# Patient Record
Sex: Female | Born: 1990 | Race: White | Hispanic: Yes | Marital: Single | Smoking: Never smoker
Health system: Southern US, Community
[De-identification: ages and names within clinical notes are randomized; demographics above are authoritative.]

## PROBLEM LIST (undated history)

## (undated) DIAGNOSIS — F419 Anxiety disorder, unspecified: Secondary | ICD-10-CM

## (undated) DIAGNOSIS — Z8489 Family history of other specified conditions: Secondary | ICD-10-CM

## (undated) DIAGNOSIS — R519 Headache, unspecified: Secondary | ICD-10-CM

## (undated) DIAGNOSIS — J45909 Unspecified asthma, uncomplicated: Secondary | ICD-10-CM

## (undated) DIAGNOSIS — N871 Moderate cervical dysplasia: Secondary | ICD-10-CM

## (undated) DIAGNOSIS — R87610 Atypical squamous cells of undetermined significance on cytologic smear of cervix (ASC-US): Secondary | ICD-10-CM

## (undated) DIAGNOSIS — R51 Headache: Secondary | ICD-10-CM

## (undated) DIAGNOSIS — C801 Malignant (primary) neoplasm, unspecified: Secondary | ICD-10-CM

## (undated) DIAGNOSIS — K219 Gastro-esophageal reflux disease without esophagitis: Secondary | ICD-10-CM

## (undated) DIAGNOSIS — Z87442 Personal history of urinary calculi: Secondary | ICD-10-CM

## (undated) HISTORY — DX: Personal history of urinary calculi: Z87.442

## (undated) HISTORY — DX: Unspecified asthma, uncomplicated: J45.909

## (undated) HISTORY — DX: Atypical squamous cells of undetermined significance on cytologic smear of cervix (ASC-US): R87.610

## (undated) HISTORY — DX: Malignant (primary) neoplasm, unspecified: C80.1

## (undated) HISTORY — DX: Moderate cervical dysplasia: N87.1

## (undated) HISTORY — PX: NOSE SURGERY: SHX723

---

## 2010-10-30 HISTORY — PX: DILATION AND CURETTAGE OF UTERUS: SHX78

## 2016-08-22 ENCOUNTER — Ambulatory Visit: Payer: Self-pay | Admitting: Gynecology

## 2016-09-04 ENCOUNTER — Encounter: Payer: Self-pay | Admitting: Gynecology

## 2016-09-04 ENCOUNTER — Ambulatory Visit (INDEPENDENT_AMBULATORY_CARE_PROVIDER_SITE_OTHER): Payer: Self-pay | Admitting: Gynecology

## 2016-09-04 VITALS — BP 130/84 | Ht 62.0 in | Wt 139.0 lb

## 2016-09-04 DIAGNOSIS — Z01411 Encounter for gynecological examination (general) (routine) with abnormal findings: Secondary | ICD-10-CM

## 2016-09-04 DIAGNOSIS — L292 Pruritus vulvae: Secondary | ICD-10-CM

## 2016-09-04 DIAGNOSIS — R6889 Other general symptoms and signs: Secondary | ICD-10-CM

## 2016-09-04 DIAGNOSIS — Z113 Encounter for screening for infections with a predominantly sexual mode of transmission: Secondary | ICD-10-CM

## 2016-09-04 LAB — CBC WITH DIFFERENTIAL/PLATELET
BASOS ABS: 0 {cells}/uL (ref 0–200)
Basophils Relative: 0 %
EOS ABS: 84 {cells}/uL (ref 15–500)
EOS PCT: 1 %
HCT: 44.4 % (ref 35.0–45.0)
Hemoglobin: 14.8 g/dL (ref 11.7–15.5)
Lymphocytes Relative: 21 %
Lymphs Abs: 1764 cells/uL (ref 850–3900)
MCH: 28.4 pg (ref 27.0–33.0)
MCHC: 33.3 g/dL (ref 32.0–36.0)
MCV: 85.2 fL (ref 80.0–100.0)
MONOS PCT: 5 %
MPV: 10.5 fL (ref 7.5–12.5)
Monocytes Absolute: 420 cells/uL (ref 200–950)
NEUTROS PCT: 73 %
Neutro Abs: 6132 cells/uL (ref 1500–7800)
PLATELETS: 265 10*3/uL (ref 140–400)
RBC: 5.21 MIL/uL — ABNORMAL HIGH (ref 3.80–5.10)
RDW: 13.9 % (ref 11.0–15.0)
WBC: 8.4 10*3/uL (ref 3.8–10.8)

## 2016-09-04 LAB — LIPID PANEL
CHOL/HDL RATIO: 4.1 ratio (ref <5.0)
CHOLESTEROL: 284 mg/dL — AB (ref <200)
HDL: 70 mg/dL (ref >50)
LDL CALC: 188 mg/dL — AB
TRIGLYCERIDES: 128 mg/dL (ref <150)
VLDL: 26 mg/dL (ref <30)

## 2016-09-04 LAB — COMPREHENSIVE METABOLIC PANEL
ALBUMIN: 4.7 g/dL (ref 3.6–5.1)
ALT: 15 U/L (ref 6–29)
AST: 15 U/L (ref 10–30)
Alkaline Phosphatase: 73 U/L (ref 33–115)
BUN: 11 mg/dL (ref 7–25)
CHLORIDE: 102 mmol/L (ref 98–110)
CO2: 23 mmol/L (ref 20–31)
CREATININE: 0.61 mg/dL (ref 0.50–1.10)
Calcium: 9.4 mg/dL (ref 8.6–10.2)
Glucose, Bld: 83 mg/dL (ref 65–99)
POTASSIUM: 3.9 mmol/L (ref 3.5–5.3)
SODIUM: 139 mmol/L (ref 135–146)
Total Bilirubin: 0.4 mg/dL (ref 0.2–1.2)
Total Protein: 7.8 g/dL (ref 6.1–8.1)

## 2016-09-04 LAB — TSH: TSH: 1.45 mIU/L

## 2016-09-04 LAB — WET PREP FOR TRICH, YEAST, CLUE
CLUE CELLS WET PREP: NONE SEEN
Trich, Wet Prep: NONE SEEN
Yeast Wet Prep HPF POC: NONE SEEN

## 2016-09-04 MED ORDER — METRONIDAZOLE 500 MG PO TABS: 500.0000 mg | ORAL_TABLET | Freq: Two times a day (BID) | ORAL | 0 refills | Status: DC

## 2016-09-04 NOTE — Progress Notes (Signed)
Chelsea Fernandez Dec 12, 1990 [id number]   History:    25 y.o.  for annual gyn exam who is a new patient to the practice. Patient states she has never had a gynecological exam or Pap smear in the past. She has been sexually active in the past and has had a total of 5 partners she has not been sexually active now since last month. She reports normal menstrual cycles. She had used condoms in the past. She has never received the HPV vaccine series. She had complained of some vulvar pruritus and was interested in an STD screen. She declined the flu vaccine today.  Past medical history,surgical history, family history and social history were all reviewed and documented in the EPIC chart.  Gynecologic History Patient's last menstrual period was 08/27/2016. Contraception: condoms Last Pap: No previous study. Results were: No previous study Last mammogram: Not indicated. Results were: Not indicated  Obstetric History OB History  Gravida Para Term Preterm AB Living  1 0     1 0  SAB TAB Ectopic Multiple Live Births  1            # Outcome Date GA Lbr Len/2nd Weight Sex Delivery Anes PTL Lv  1 SAB                ROS: A ROS was performed and pertinent positives and negatives are included in the history.  GENERAL: No fevers or chills. HEENT: No change in vision, no earache, sore throat or sinus congestion. NECK: No pain or stiffness. CARDIOVASCULAR: No chest pain or pressure. No palpitations. PULMONARY: No shortness of breath, cough or wheeze. GASTROINTESTINAL: No abdominal pain, nausea, vomiting or diarrhea, melena or bright red blood per rectum. GENITOURINARY: No urinary frequency, urgency, hesitancy or dysuria. MUSCULOSKELETAL: No joint or muscle pain, no back pain, no recent trauma. DERMATOLOGIC: No rash, no itching, no lesions. ENDOCRINE: No polyuria, polydipsia, no heat or cold intolerance. No recent change in weight. HEMATOLOGICAL: No anemia or easy bruising or bleeding. NEUROLOGIC: No  headache, seizures, numbness, tingling or weakness. PSYCHIATRIC: No depression, no loss of interest in normal activity or change in sleep pattern.     Exam: chaperone present  BP 130/84   Ht 5\' 2"  (1.575 m)   Wt 139 lb (63 kg)   LMP 08/27/2016   BMI 25.42 kg/m   Body mass index is 25.42 kg/m.  General appearance : Well developed well nourished female. No acute distress HEENT: Eyes: no retinal hemorrhage or exudates,  Neck supple, trachea midline, no carotid bruits, no thyroidmegaly Lungs: Clear to auscultation, no rhonchi or wheezes, or rib retractions  Heart: Regular rate and rhythm, no murmurs or gallops Breast:Examined in sitting and supine position were symmetrical in appearance, no palpable masses or tenderness,  no skin retraction, no nipple inversion, no nipple discharge, no skin discoloration, no axillary or supraclavicular lymphadenopathy Abdomen: no palpable masses or tenderness, no rebound or guarding Extremities: no edema or skin discoloration or tenderness  Pelvic:  Bartholin, Urethra, Skene Glands: Within normal limits             Vagina: No gross lesions or discharge  Cervix: No gross lesions or discharge  Uterus  anteverted, normal size, shape and consistency, non-tender and mobile  Adnexa  Without masses or tenderness  Anus and perineum  normal   Rectovaginal  normal sphincter tone without palpated masses or tenderness             Hemoccult not indicated  Wet prep many white blood cells, moderate bacteria  Assessment/Plan:  25 y.o. female for annual exam with nonspecific vaginitis we'll give her prescription for Flagyl 500 mg take 1 by mouth twice a day for 7 days. GC and Chlamydia culture was obtained today. Pap smear without HPV screening was done today according to the new guidelines. To complete the has STD screen the following blood work will be ordered: HIV, RPR, hepatitis B and C. Also a CBC, fasting lipid profile, conference metabolic panel, TSH and  urinalysis. Patient declined flu vaccine. Literature information of the HPV vaccine was provided.   Terrance Mass MD, 11:23 AM 09/04/2016

## 2016-09-04 NOTE — Patient Instructions (Addendum)
Human Papillomavirus Quadrivalent Vaccine suspension for injection Qu es este medicamento? La [employer] CONTRA EL VIRUS DEL PAPILOMA HUMANO es una vacuna. Se utiliza para prevenir infecciones de cuatro tipos de virus del papiloma humano. En mujeres, la vacuna puede disminuir su riesgo de desarrollar cncer cervical, vaginal, vulvar, o anal y verrugas genitales. En hombres, la vacuna puede disminuir su riesgo de verrugas genitales y cncer anal. No puede contraer estas enfermedades de esta vacuna. Este medicamento no trata [employer]. Este medicamento puede ser utilizado para otros usos; si tiene alguna pregunta consulte con su proveedor de atencin mdica o con su farmacutico. Qu le debo informar a mi profesional de la salud antes de tomar este medicamento? Necesita saber si usted presenta alguno de los siguientes problemas o situaciones: -fiebre o infeccin -hemofilia -infeccin por VIH o SIDA -problemas del sistema inmunolgico -conteos bajos de plaquetas -una reaccin alrgica o inusual a la vacuna contra el virus del papiloma humano, a la levadura, a otros medicamentos, alimentos, colorantes o conservantes -si est embarazada o buscando quedar embarazada -si est amamantando a un beb Cmo debo utilizar este medicamento? Esta vacuna se inyecta en el msculo en la parte superior del brazo o en el muslo. La administra un profesional de [employer]	[place]. Debe ser supervisado por 15 minutos despus de recibir cada dosis. A veces, puede desmayarse despus de recibir la vacuna. Es posible que le pidan que se siente o se acueste durante los 15 minutos. Se administran tres dosis. La segunda dosis se administra 2 meses de recibir la primera dosis. La ltima dosis se administra 4 meses despus de recibir la segunda dosis. Recibir una copia de informacin escrita sobre la vacuna antes de cada vacuna. Asegrese de leer este folleto cada vez cuidadosamente. Este folleto puede cambiar con  frecuencia. Hable con su pediatra para informarse acerca del uso de este medicamento en nios. Aunque este medicamento ha sido recetado a nios tan menores como de 9 aos de edad para condiciones selectivas, las precauciones se aplican. Sobredosis: Pngase en contacto inmediatamente con un centro toxicolgico o una sala de urgencia si usted cree que haya tomado demasiado medicamento. ATENCIN: [employer] es solo para usted. No comparta este medicamento con nadie. Qu sucede si me olvido de una dosis? Todas las 3 dosis de esta vacuna deben ser administradas dentro de 6 meses. Recuerde de mantener todas las citas para las dosis siguientes. Su proveedor de Museum/gallery curator cuando necesita volver para su prxima dosis. Consulte a su profesional de la salud por asesoramiento si no puede asistir a una cita o si se olvida una dosis programada. Qu puede interactuar con este medicamento? -otras vacunas Puede ser que esta lista no menciona todas las posibles interacciones. Informe a su profesional de KB Home	[place] de [employer] productos a base de hierbas, medicamentos de [place] o suplementos nutritivos que est tomando. Si usted fuma, consume bebidas alcohlicas o si utiliza drogas ilegales, indqueselo tambin a su profesional de KB Home	[place]. Algunas sustancias pueden interactuar con su medicamento. A qu debo estar atento al usar [employer]? Es posible que esta vacuna no proteja completamente a todos. Contine a realizarse exmenes plvicos y del cncer cervical o anal de Geographical information systems officer regular como le haya indicado su mdico. El virus del papiloma humano es una enfermedad de transmisin sexual. Se puede pasar por cualquier actividad sexual que consiste de contacto genital. La vacuna acta mejor cuando se administra antes de tener contacto con el virus. [employer]  tienen el virus no muestran signos ni sntomas ningunos. Si presenta una reaccin o sntoma inusual despus de  recibir la vacuna, informe a su mdico o su profesional de [employer]	[place]. Qu efectos secundarios puedo tener al [employer] este medicamento? Efectos secundarios que debe informar a su mdico o a Barrister's clerk de la salud tan pronto como sea posible: -Chief of Staff como erupcin cutnea, picazn o urticarias, hinchazn de la cara, labios o lengua -problemas respiratorios -sensacin de desmayos o cadas Efectos secundarios que, por lo general, no requieren atencin mdica (debe informarlos a su mdico o a su profesional de la salud si persisten o si son molestos): -tos -mareos -fiebre -dolor de cabeza -nusea -enrojecimiento, calor, hinchazn, dolor o picazn en el lugar de la inyeccin Puede ser que esta lista no menciona todos los posibles efectos secundarios. Comunquese a su mdico por asesoramiento mdico [employer]. Usted puede informar los efectos secundarios a la FDA por telfono al 1-800-FDA-1088. Dnde debo guardar mi medicina? Este medicamento se administra en hospitales o clnicas y no necesitar guardarlo en su domicilio. ATENCIN: Este folleto es un resumen. Puede ser que no cubra toda la posible informacin. Si usted tiene preguntas acerca de esta medicina, consulte con su mdico, su farmacutico o su profesional de Technical sales engineer.    2016, Elsevier/Gold Standard. (2014-12-09 00:00:00)   Vaginosis bacteriana (Bacterial Vaginosis) La vaginosis bacteriana es una infeccin vaginal que perturba el equilibrio normal de las bacterias que se encuentran en la vagina. Es el resultado de un crecimiento excesivo de ciertas bacterias. Esta es la infeccin vaginal ms frecuente en mujeres en edad reproductiva. El tratamiento es importante para prevenir complicaciones, especialmente en mujeres embarazadas, dado que puede causar un parto prematuro. CAUSAS  La vaginosis bacteriana se origina por un aumento de bacterias nocivas que, generalmente, estn presentes en cantidades  ms pequeas en la vagina. Varios tipos diferentes de bacterias pueden causar esta afeccin. Sin embargo, la causa de su desarrollo no se comprende totalmente. [place] o comportamientos pueden exponerlo a un mayor riesgo de desarrollar vaginosis bacteriana, entre los que se incluyen:  Tener una nueva pareja sexual o mltiples parejas sexuales.  Las duchas vaginales  El uso del DIU (dispositivo intrauterino) como mtodo anticonceptivo. El contagio no se produce en baos, por ropas de cama, en piscinas o por contacto con objetos. SIGNOS Y SNTOMAS  Algunas mujeres que padecen vaginosis bacteriana no presentan signos ni sntomas. Los sntomas ms comunes son:  Secrecin vaginal de color grisceo.  Secrecin vaginal con olor similar al [employer], especialmente despus de Retail banker.  Picazn o sensacin de ardor en la vagina o la vulva.  Ardor o dolor al [employer]. DIAGNSTICO  Su mdico analizar su historia clnica y le examinar la vagina para detectar signos de vaginosis bacteriana. Puede tomarle Truddie Coco de flujo vaginal. Su mdico examinar esta muestra con un microscopio para controlar las bacterias y clulas anormales. Tambin puede realizarse un anlisis del pH vaginal.  TRATAMIENTO  La vaginosis bacteriana puede tratarse con antibiticos, en forma de comprimidos o de crema vaginal. Puede indicarse una segunda tanda de antibiticos si la afeccin se repite despus del tratamiento. Debido a que la vaginosis bacteriana aumenta el riesgo de contraer enfermedades de transmisin sexual, el tratamiento puede ayudar a reducir el riesgo de clamidia, [place], VIH y herpes. [place] solo medicamentos de venta libre o recetados, segn las indicaciones del mdico.  Si le han  recetado antibiticos, tmelos como se le indic. Asegrese de que finaliza la prescripcin completa aunque se sienta  mejor.  Comunique a sus compaeros sexuales que sufre una infeccin vaginal. Deben consultar a su mdico y recibir tratamiento si tienen problemas, como picazn o una erupcin cutnea leve.  Durante el [place], es importante que siga estas indicaciones:  Visual merchandiser relaciones sexuales o use preservativos de la forma correcta.  No se haga duchas vaginales.  Evite consumir alcohol como se lo haya indicado el mdico.  Community education officer se lo haya indicado el mdico. SOLICITE ATENCIN MDICA SI:   Sus sntomas no mejoran despus de 3 das de [place].  Aumenta la secrecin o Conservation officer, historic buildings.  Tiene fiebre. ASEGRESE DE QUE:   Comprende estas instrucciones.  Controlar su afeccin.  Recibir ayuda de inmediato si no mejora o si empeora. PARA OBTENER MS INFORMACIN  Centros para el control y la prevencin de Probation officer for Disease Control and Prevention, CDC): AppraiserFraud.fi Asociacin Estadounidense de la Salud Sexual (American Sexual Health Association, SHA): www.ashastd.org    Esta informacin no tiene Marine scientist el consejo del mdico. Asegrese de hacerle al mdico cualquier pregunta que tenga.   Document Released: 01/23/2008 Document Revised: 11/06/2014 Elsevier Interactive Patient Education 2016 [employer]. Metronidazole injection Sander Nephew es este medicamento? El METRONIDAZOL es un antiinfeccioso. Este medicamento se [place] para tratar o prevenir ciertos tipos de infecciones bacterianas y por protozoos. No es efectivo para resfros, gripe u otras infecciones de origen viral. Este medicamento puede ser utilizado para otros usos; si tiene alguna pregunta consulte con su proveedor de atencin mdica o con su farmacutico. Qu le debo informar a mi profesional de la salud antes de tomar este medicamento? Necesita saber si usted presenta alguno de los [employer] o situaciones: -anemia u otros trastornos sanguneos -enfermedad del sistema  nervioso -infeccin mictica o por levadura -antecedentes de edema, hinchazn -si consume bebidas alcohlicas -enfermedad heptica -convulsiones -una reaccin alrgica o inusual al metronidazol, a otros medicamentos, alimentos, colorantes o conservantes -si est embarazada o buscando quedar embarazada -si est amamantando a un beb Cmo debo utilizar este medicamento? Este medicamento se administra mediante infusin por va intravenosa. Lo administra un profesional de Technical sales engineer en un hospital o en un entorno clnico. Si recibe este medicamento en su domicilio, le ensearn cmo preparar y Biomedical engineer. selo exactamente como se le indique. Use sus dosis a intervalos regulares. No use su medicamento con una frecuencia mayor a la indicada. Es importante que deseche las agujas y las jeringas usadas en un recipiente resistente a los pinchazos. No las deseche en una basura. Si no tiene un recipiente resistente a los pinchazos, consulte a Midwife o su proveedor de atencin para obtenerlo. Hable con su pediatra para informarse acerca del uso de este medicamento en nios. Puede requerir atencin especial. Sobredosis: Pngase en contacto inmediatamente con un centro toxicolgico o una sala de urgencia si usted cree que haya tomado demasiado medicamento. ATENCIN: [employer] es solo para usted. No comparta este medicamento con nadie. Qu sucede si me olvido de una dosis? Si olvida una dosis, tmela lo antes posible. Si es casi la hora de la prxima dosis, tome slo esa dosis. No tome dosis adicionales o dobles. Qu puede interactuar con este medicamento? No tome esta medicina con ninguno de los siguientes medicamentos: -alcohol o cualquier producto que contiene alcohol -solucin oral de amprenavir -cisapride -disulfiram -dofetilida -dronedarona -inyeccin de paclitaxel -pimozida -solucin oral de ritonavir -solucin oral de  sertralina -inyeccin de  sulfametoxasol-trimetoprima -tioridazina -ziprasidona Esta medicina tambin puede interactuar con los siguientes medicamentos: -pldoras anticonceptivas -cimetidina -litio -otros medicamentos que prolongan el intervalo QT (provoca un ritmo cardiaco anormal) -fenobarbital -fenitona -warfarina Puede ser que esta lista no menciona todas las posibles interacciones. Informe a su profesional de KB Home	[place] de [employer] productos a base de hierbas, medicamentos de [place] o suplementos nutritivos que est tomando. Si usted fuma, consume bebidas alcohlicas o si utiliza drogas ilegales, indqueselo tambin a su profesional de KB Home	[place]. Algunas sustancias pueden interactuar con su medicamento. A qu debo estar atento al usar Coca[employer]Cola? Consulte a su mdico o a su profesional de la salud si sus sntomas no mejoran o si empeoran. Puede experimentar mareos o somnolencia. No conduzca ni utilice maquinaria ni haga nada que Associate Professor en estado de alerta hasta que sepa cmo le afecta este medicamento. No se siente ni se ponga de pie con rapidez, especialmente si es un paciente de edad avanzada. Esto reduce el riesgo de mareos o [employer]. Evite las bebidas alcohlicas durante el tratamiento con este medicamento y [employer] tres das siguientes. El alcohol puede causarle mareos o hacerlo sentir enfermo o ruborizado. Si est recibiendo tratamiento para una enfermedad de transmisin sexual, no tenga relaciones sexuales hasta que haya completado el [place]. Es posible que su pareja tambin necesite [place]. Qu efectos secundarios puedo tener al [employer] este medicamento? Efectos secundarios que debe informar a su mdico o a Barrister's clerk de la salud tan pronto como sea posible: -Chief of Staff como erupcin cutnea o urticarias, hinchazn de la cara, labios o lengua -confusin, torpeza -dificultad para hablar -decoloracin o dolor de la boca -mareos -fiebre,  infeccin -entumecimiento, hormigueo, dolor o debilidad -dificultad para orinar o cambios en el volumen de orina -enrojecimiento, formacin de ampollas, descamacin o distensin de la piel, inclusive dentro de la boca -convulsiones -sangrado, magulladuras inusuales -cansancio o debilidad inusual -irritacin, resequedad o flujo de la vagina Efectos secundarios que, por lo general, no requieren atencin mdica (debe informarlos a su mdico o a su profesional de la salud si persisten o si son molestos): -orina oscura -diarrea -dolor de cabeza -irritabilidad -sabor metlico -nuseas -dolor, irritacin en el lugar de la inyeccin -calambres, dolores estomacales -dificultad para conciliar el sueo Puede ser que esta lista no menciona todos los posibles efectos secundarios. Comunquese a su mdico por asesoramiento mdico [employer]. Usted puede informar los efectos secundarios a la FDA por telfono al 1-800-FDA-1088. Dnde debo guardar mi medicina? Mantngala fuera del alcance de los nios. Si est Theatre manager en su domicilio recibir instrucciones acerca de cmo debe guardar este medicamento. Deseche todo el medicamento que no haya utilizado, despus de la fecha de vencimiento. ATENCIN: Este folleto es un resumen. Puede ser que no cubra toda la posible informacin. Si usted tiene preguntas acerca de esta medicina, consulte con su mdico, su farmacutico o su profesional de Technical sales engineer.    2016, Elsevier/Gold Standard. (2014-12-08 00:00:00)

## 2016-09-05 ENCOUNTER — Encounter: Payer: Self-pay | Admitting: Gynecology

## 2016-09-05 ENCOUNTER — Telehealth: Payer: Self-pay | Admitting: *Deleted

## 2016-09-05 DIAGNOSIS — E785 Hyperlipidemia, unspecified: Secondary | ICD-10-CM | POA: Insufficient documentation

## 2016-09-05 LAB — URINALYSIS W MICROSCOPIC + REFLEX CULTURE
BACTERIA UA: NONE SEEN [HPF]
BILIRUBIN URINE: NEGATIVE
Casts: NONE SEEN [LPF]
GLUCOSE, UA: NEGATIVE
HGB URINE DIPSTICK: NEGATIVE
Ketones, ur: NEGATIVE
LEUKOCYTES UA: NEGATIVE
Nitrite: NEGATIVE
PH: 6 (ref 5.0–8.0)
PROTEIN: NEGATIVE
Specific Gravity, Urine: 1.025 (ref 1.001–1.035)
WBC UA: NONE SEEN WBC/HPF (ref <=5)
Yeast: NONE SEEN [HPF]

## 2016-09-05 LAB — GC/CHLAMYDIA PROBE AMP
CT Probe RNA: NOT DETECTED
GC PROBE AMP APTIMA: NOT DETECTED

## 2016-09-05 LAB — HEPATITIS C ANTIBODY: HCV Ab: NEGATIVE

## 2016-09-05 LAB — HEPATITIS B SURFACE ANTIGEN: HEP B S AG: NEGATIVE

## 2016-09-05 LAB — HIV ANTIBODY (ROUTINE TESTING W REFLEX): HIV 1&2 Ab, 4th Generation: NONREACTIVE

## 2016-09-05 LAB — RPR

## 2016-09-05 MED ORDER — METRONIDAZOLE 500 MG PO TABS: 500.0000 mg | ORAL_TABLET | Freq: Two times a day (BID) | ORAL | 0 refills | Status: DC

## 2016-09-05 NOTE — Telephone Encounter (Signed)
Pt was seen on 09/04/16 and Rx for flagyl was never sent to pharmacy, it was set on print, Rx sent today.

## 2016-09-06 ENCOUNTER — Other Ambulatory Visit: Payer: Self-pay | Admitting: Gynecology

## 2016-09-06 DIAGNOSIS — E7800 Pure hypercholesterolemia, unspecified: Secondary | ICD-10-CM

## 2016-09-06 LAB — URINE CULTURE

## 2016-09-08 LAB — PAP IG W/ RFLX HPV ASCU

## 2016-09-11 ENCOUNTER — Encounter: Payer: Self-pay | Admitting: Gynecology

## 2016-09-11 LAB — HUMAN PAPILLOMAVIRUS, HIGH RISK: HPV DNA High Risk: DETECTED — AB

## 2016-10-04 ENCOUNTER — Encounter: Payer: Self-pay | Admitting: Gynecology

## 2016-10-04 ENCOUNTER — Ambulatory Visit (INDEPENDENT_AMBULATORY_CARE_PROVIDER_SITE_OTHER): Payer: Self-pay | Admitting: Gynecology

## 2016-10-04 VITALS — BP 120/72

## 2016-10-04 DIAGNOSIS — R87610 Atypical squamous cells of undetermined significance on cytologic smear of cervix (ASC-US): Secondary | ICD-10-CM | POA: Insufficient documentation

## 2016-10-04 DIAGNOSIS — Z23 Encounter for immunization: Secondary | ICD-10-CM

## 2016-10-04 DIAGNOSIS — R87810 Cervical high risk human papillomavirus (HPV) DNA test positive: Secondary | ICD-10-CM

## 2016-10-04 NOTE — Patient Instructions (Signed)
Vacuna contra el VPH (papiloma humano): Lo que debe saber (HPV [Human Papillomavirus] Vaccine: What You Need to Know) 1. Por qu vacunarse? La vacuna contra el VPH evita la infeccin por algunos tipos del virus del papiloma humano (VPH) asociados a diversos tipos de Hotel manager, entre ellos:  cncer de cuello del tero en las mujeres,  cncer vaginal y vulvar en las mujeres,  cncer anal en las mujeres y los hombres,  cncer de garganta en las mujeres y los hombres,  cncer de pene en los hombres. Adems, la vacuna contra el VPH previene los tipos de VPH que causan verrugas genitales tanto en las mujeres como en los hombres. En los [place], cerca de 12000 mujeres contraen cncer de cuello del tero cada ao y alrededor de 4000 mueren a causa de l. La vacuna Consulting civil engineer VPH puede prevenir la mayora de estos casos de cncer de cuello del tero. La vacunacin no sustituye a los estudios para Public affairs consultant de cuello del tero. Esta vacuna no protege contra todos los tipos de VPH que pueden provocar cncer de cuello del tero. Las mujeres deben hacerse pruebas de Papanicolaou con regularidad.  La infeccin por el VPH en general se contrae por contacto sexual, y [place] se infectan en algn momento de su vida. Alrededor de 14 millones de estadounidenses, incluidas adolescentes, se infectan cada ao. La mayora de las infecciones desaparecern por s solas y no causarn problemas graves. Pero miles de mujeres y hombres contraen cncer y Sid Falcon enfermedades a causa del VPH. 2. Edward Jolly contra el VPH La vacuna contra el VPH est aprobada por la FDA y los CDC la recomiendan tanto para las mujeres como para los hombres. Se administra habitualmente a los 11 o 12 aos, Armed forces training and education officer se Administrator, sports desde los 9 [employer] 25 aos. La [employer] Corporation de 9 a 14aos deberan recibir la vacuna contra el VPH en dos dosis con un intervalo de 6 a 6meses. Las personas que comienzan a  vacunarse a Proofreader de los 15aos deberan recibir la vacuna en tres dosis; la segunda dosis se administra 1 o 46meses despus de la primera, y Therapist, nutritional, 73meses despus de la primera. Existen varias excepciones a estas recomendaciones de edad. Su mdico puede darle ms informacin. 3. Algunas personas no deben recibir la [employer] que hayan sufrido una reaccin alrgica grave (potencialmente mortal) a una dosis de la vacuna contra el VPH no deben recibir otra dosis.  Las personas que tengan una alergia grave (potencialmente mortal) a algn componente de la vacuna contra el VPH no deben recibir TEFL teacher.  Infrmele al mdico si sufre alguna alergia que usted conozca, como una alergia grave a Teacher, music.  La vacuna contra el VPH no se recomienda en mujeres embarazadas. Si se entera de que estaba embarazada cuando la vacunaron, no hay motivos para suponer que usted o su beb tendrn algn problema. Toda mujer que se entere de que estaba embarazada cuando recibi la vacuna contra el VPH debe comunicarse con el registro de vacunacin contra el VPH perteneciente al fabricante Alfredia Ferguson, [place] al [phone]. Las mujeres que amamantan pueden ser vacunadas.  Si tiene [employer], como un resfro, probablemente pueda recibir la vacuna. Si sufre una enfermedad moderada o grave, probablemente deba esperar hasta recuperarse para poder vacunarse. El mdico puede darle recomendaciones al respecto. 4. [place] de [place] reaccin a la vacuna Con cualquier medicamento, incluso las vacunas, existe la posibilidad  de que aparezcan efectos secundarios. Estos suelen ser leves y Armed forces operational officer por s solos, pero tambin es posible que se presenten reacciones graves. La [employer] de las personas a las que se les aplica la vacuna contra el VPH no tienen ningn problema. Algunos problemas leves o moderados despus de recibir la vacuna contra el VPH:   Reacciones en el brazo, en el sitio de la  inyeccin:  Social research officer, government (alrededor de 9 de cada 10 personas)  Enrojecimiento o hinchazn (alrededor de 1de cada 3personas)  Cristy Hilts:  Leve (100F Jareth.Covey ]) (alrededor de 1 de cada 10personas)  Moderada (102F [39C]) (alrededor de 1 de cada 65personas)  Otros problemas:  Dolor de Pensions consultant (alrededor de 1 de cada 3personas) Problemas que podran ocurrir despus de aplicarse cualquier vacuna inyectable:   Las personas a veces se desmayan despus de un procedimiento mdico, incluida la vacunacin. Permanecer sentado o recostado durante 49minutos puede ayudar a [employer] y las lesiones causadas por las cadas. Informe al mdico si se siente mareado, tiene cambios en la visin o zumbidos en los odos.  Algunas personas sienten un dolor intenso en el hombro y tienen dificultad para mover el brazo donde se coloc la vacuna. Esto sucede con muy poca frecuencia.  Cualquier medicamento puede causar una reaccin alrgica grave. Dichas reacciones son Orlene Erm poco frecuentes con una vacuna (se calcula que menos de 1en un milln de dosis) y se producen unos minutos a unas horas despus de la vacunacin. Al igual que con cualquier [employer], existe una probabilidad muy remota de que una vacuna cause una lesin grave o la [place]. Se controla permanentemente la seguridad de las vacunas. Para obtener ms informacin, visite: http://www.aguilar.org/. 5. Qu pasa si hay una reaccin grave? A qu signos debo estar atento?  Observe todo lo que le preocupe, como signos de una reaccin alrgica grave, fiebre muy alta o comportamiento fuera de lo normal. Los signos de una reaccin alrgica grave pueden incluir ronchas, hinchazn de la cara y la garganta, dificultad para respirar, latidos cardacos acelerados, mareos y debilidad. Generalmente, estos comenzaran entre unos pocos minutos y algunas horas despus de la vacunacin. Qu debo hacer?  Si usted piensa que se trata de una reaccin alrgica  grave o de otra emergencia que no puede esperar, llame al 911 o dirjase al hospital ms cercano. De lo contrario, llame al [employer]. Despus, la reaccin debe informarse al [employer] de Informacin sobre Efectos Adversos de las [place] (Vaccine Adverse Event Reporting System, VAERS). El mdico debe presentar este informe, o bien puede hacerlo usted mismo a travs del sitio web de VAERS, en www.vaers.SamedayNews.es, o llamando al 9345687903. VAERS no brinda recomendaciones mdicas.  6. [place] Compensacin de Daos por [place] de Compensacin de Daos por Clinical biochemist (National Vaccine Injury Compensation Program, VICP) es un programa federal que fue creado para Patent examiner a las personas que puedan haber sufrido daos al recibir ciertas vacunas. Aquellas personas que consideren que han sufrido un dao como consecuencia de una vacuna y [place] saber ms acerca del programa y de cmo presentar un reclamo, pueden llamar al 1-9255164664 o visitar el sitio web del VICP en GoldCloset.com.ee. Hay un lmite de tiempo para presentar un reclamo de compensacin. 7. Cmo puedo obtener ms informacin?  Pregntele a su mdico Este puede darle el prospecto de la vacuna o recomendarle otras fuentes de informacin.  Comunquese con el servicio de salud de su localidad o su estado.  Comunquese con los Centros para Building surveyor y  la Prevencin de Probation officer for Disease Control and Prevention, CDC):  Llame al [phone] (1-800-CDC-INFO) o  visite el sitio web de CDC en http://sweeney-todd.com/. Declaracin de informacin de la vacuna contra el VPH 12/11/2014 Esta informacin no tiene Marine scientist el consejo del mdico. Asegrese de hacerle al mdico cualquier pregunta que tenga. Document Released: 05/13/2014 Document Revised: 02/07/2016 Document Reviewed: 10/04/2015 Elsevier Interactive Patient Education  2017 [place] (Colposcopy, Care After) Siga estas instrucciones durante las prximas semanas. Estas indicaciones le proporcionan informacin general acerca de cmo deber cuidarse despus del procedimiento. El mdico tambin podr darle instrucciones ms especficas. El tratamiento se ha planificado de acuerdo a las prcticas mdicas actuales, pero a veces se producen problemas. Comunquese con el mdico si tiene algn problema o tiene dudas despus del procedimiento. QU ESPERAR DESPUS DEL PROCEDIMIENTO  Despus del procedimiento, es tpico tener las siguientes sensaciones:  Clicos. Generalmente se calman en algunos minutos.  Dolor. [place].  Aturdimiento. Si esto le ocurre, recustese durante algunos minutos. Podr tener un sangrado leve o una secrecin oscura que debe detenerse en [place]. Durante este tiempo deber usar un apsito sanitario. [place] vaginales y el uso de tampones durante 3 das, o segn lo que le indique su mdico.  Tome slo medicamentos de venta libre o recetados, segn las indicaciones del mdico. No tome aspirina, ya que puede causar hemorragias.  Si utiliza pldoras anticonceptivas, contine tomndolas.  No todos los resultados estarn disponibles durante su visita. En este caso, tenga otra entrevista con su mdico para conocerlos. No suponga que es normal si no tiene noticias de su mdico o del establecimiento de salud. Es Building services engineer seguimiento de todos los [place].  Siga los consejos de su mdico con respecto a los [place], [place], visitas y [place] de control. SOLICITE ATENCIN MDICA SI:  Aparece una erupcin cutnea.  Tiene problemas con los medicamentos. SOLICITE ATENCIN MDICA DE INMEDIATO SI:  Tiene una hemorragia abundante o elimina cogulos.  Tiene fiebre.  Tiene flujo vaginal anormal.  Tiene clicos que no  se alivian luego de tomar analgsicos.  Se siente mareada, tiene vahdos o se desmaya.  Siente Research scientist (life sciences). Esta informacin no tiene Marine scientist el consejo del mdico. Asegrese de hacerle al mdico cualquier pregunta que tenga. Document Released: 08/06/2013 Elsevier Interactive Patient Education  2017 [employer].

## 2016-10-04 NOTE — Progress Notes (Signed)
  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 Patient reports she had a total of 5 sexual partners in her past but currently not sexually active and had not been using any form of contraception and reports normal menstrual cycles. She had never received the HPV vaccine series and decided she wanted to have a today. She did have a full STD screening on last visit all was negative. She is here also for colposcopic evaluation.  Patient was counseled for colposcopic evaluation. A detail evaluation was done of the external genitalia, perineum, perirectal region no lesions were noted. The speculum was introduced into the vagina and a systematic inspection of the vagina, fornix and cervix was undertaken as well as applying acetic acid. A flat acetowhite area was noted at the ectocervix at the 1:00 in 12:00 areas which were respectively biopsied along with an ECC. Of note the endocervical speculum demonstrated complete visualization the transformation zone.  Assessment/plan: 25 year old with recent Pap smear ASCUS with positive HPV. Detail colposcopic evaluation with the findings noted above as well as the biopsy. We will await the results. We discussed different types of some areas depending on the pathology report and the new guidelines. Of note Monsel solution was used for hemostasis after the biopsies were obtained. She was counseled received literature information receive the first of the 3 vaccines of the HPV vaccine.

## 2016-10-04 NOTE — Addendum Note (Signed)
Addended by: Thurnell Garbe A on: 10/04/2016 11:19 AM   Modules accepted: Orders

## 2016-10-09 LAB — PATHOLOGY

## 2016-10-16 ENCOUNTER — Encounter: Payer: Self-pay | Admitting: Gynecology

## 2016-10-16 ENCOUNTER — Ambulatory Visit (INDEPENDENT_AMBULATORY_CARE_PROVIDER_SITE_OTHER): Payer: Self-pay | Admitting: Gynecology

## 2016-10-16 VITALS — BP 128/84

## 2016-10-16 DIAGNOSIS — N871 Moderate cervical dysplasia: Secondary | ICD-10-CM | POA: Insufficient documentation

## 2016-10-16 NOTE — Progress Notes (Signed)
Patient is a 25 year old that presented to the office today with her mother to discuss her colposcopic directed biopsy that was done as a result of her abnormal Pap smear. Patient was seen in the office on December 6 of this year. 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 Patient reports she had a total of 5 sexual partners in her past but currently not sexually active and had not been using any form of contraception and reports normal menstrual cycles. She also states that she has had a miscarriage in the past. She received the first of the 3 series HPV vaccine early this month. She recently had a full STD screening which was negative.  On 10/04/2016 she underwent a detail colposcopic evaluation of the external genitalia, perineum, perirectal region no lesions were noted. The speculum was introduced into the vagina and a systematic inspection of the vagina, fornix and cervix was undertaken as well as applying acetic acid. A flat acetowhite area was noted at the ectocervix at the 1:00 in 12:00 areas which were respectively biopsied along with an ECC. Of note the endocervical speculum demonstrated complete visualization the transformation zone. The pathology report demonstrated the following:  FINAL DIAGNOSIS:  A. Endocervix - Curettage:     Fragments of benign endocervical mucosa.     B. 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     Squamous dysplasia extends into endocervical mucosa.     See comment.     C. 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     The above findings were discussed with the patient and her mother and the new guidelines and  recommendations as follows:  Cyst patient is planning on having children in the future we had discussed close observation as an option versus treatment. We discussed a recent study which had demonstrated that young women with CIN-2 lesions regress and 70% a female by contrast regression rates were lower and older women between 30 and 50% after 2 years of follow-up. We also discussed at the rate increases with age, reaching 10% per year for women at age 21 or older. We also discussed that either treatment or observations except tubal if colposcopy is adequate as it was in her case. Observation is preferred. The recommendation is cytology and colposcopy every 6 months for 12 months and of the cytology and colposcopy are negative for 2 visits, HPV and cytology code testing shouldn't be performed once yearly. If the cone testing is negative, co-testing should be performed every 3 years. Colposcopy should be performed if the two-year or five-year co-testing is abnormal. We also discussed that if with close surveillance colposcopic appearance of the lesion worsens or is a high grade squamous intraepithelial lesion or a high-grade lesion persists for 1 year repeat biopsy is recommended. If CIN-2 or 3 persists for 24 months treatment would be the preferred approach. Alternatively would be treatment with either excision or ablation of the cervical transformation zone.  Patient mother decided to proceed with a close observation approach. All the above information was provided for the patient to repeat. She will return in February and in June for her second and third HPV vaccine to complete the series. And she will return back  in June for colposcopy and Pap smear.

## 2016-10-26 ENCOUNTER — Ambulatory Visit: Payer: Self-pay | Admitting: Gynecology

## 2016-11-20 ENCOUNTER — Encounter: Payer: Self-pay | Admitting: Gynecology

## 2016-11-20 ENCOUNTER — Ambulatory Visit (INDEPENDENT_AMBULATORY_CARE_PROVIDER_SITE_OTHER): Payer: Self-pay | Admitting: Gynecology

## 2016-11-20 VITALS — BP 122/80 | Ht 62.0 in | Wt 139.0 lb

## 2016-11-20 DIAGNOSIS — Z113 Encounter for screening for infections with a predominantly sexual mode of transmission: Secondary | ICD-10-CM

## 2016-11-20 DIAGNOSIS — N949 Unspecified condition associated with female genital organs and menstrual cycle: Secondary | ICD-10-CM

## 2016-11-20 LAB — WET PREP FOR TRICH, YEAST, CLUE
Clue Cells Wet Prep HPF POC: NONE SEEN
Trich, Wet Prep: NONE SEEN
WBC, Wet Prep HPF POC: NONE SEEN
Yeast Wet Prep HPF POC: NONE SEEN

## 2016-11-20 MED ORDER — METRONIDAZOLE 500 MG PO TABS: 500.0000 mg | ORAL_TABLET | Freq: Two times a day (BID) | ORAL | 0 refills | Status: DC

## 2016-11-20 NOTE — Progress Notes (Signed)
   Patient is a 26 year old a came to the office with her mother with concerns of possibly having herpes because she feels like a burning sensation in her vagina. She stated she went a week ago to the urgent care and they gave her cream for suspected yeast infection which she took for a week intravaginally. She is not sexually active. She has no GU or GI complaints. She was seen in the office on December 18 whereby she had colposcopic directed biopsy as a result of her Pap smear having demonstrated ASCUS with high-risk HPV. 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 She also received in December her first of 3 HPV vaccine. Patient currently menstruating. Patient in November and December this past year she had a full STD screening consisting of HIV, RPR, hepatitis B and C as well as GC and Chlamydia culture all were negative.  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 We will obtain an HSV panel today. The reporting the results of the HSV was explained to the mother and to the daughter. In the event that's there is some underlying BV we'll go ahead and give her Flagyl 500 mg twice a day for 7 days. Will notify does any abnormality in the above-mentioned test. She was reminded to return back next months for her second HPV vaccine and then in June at which time she will need her colposcopy and Pap smear

## 2016-11-20 NOTE — Addendum Note (Signed)
Addended by: Thurnell Garbe A on: 11/20/2016 11:20 AM   Modules accepted: Orders

## 2016-11-20 NOTE — Addendum Note (Signed)
Addended by: Thurnell Garbe A on: 11/20/2016 11:21 AM   Modules accepted: Orders

## 2016-11-21 LAB — HSV(HERPES SIMPLEX VRS) I + II AB-IGG: HSV 2 Glycoprotein G Ab, IgG: <0.9 Index (ref <0.90)

## 2016-12-04 ENCOUNTER — Ambulatory Visit (INDEPENDENT_AMBULATORY_CARE_PROVIDER_SITE_OTHER): Payer: Self-pay | Admitting: Gynecology

## 2016-12-04 ENCOUNTER — Encounter: Payer: Self-pay | Admitting: Gynecology

## 2016-12-04 VITALS — BP 118/76

## 2016-12-04 DIAGNOSIS — Z23 Encounter for immunization: Secondary | ICD-10-CM

## 2016-12-04 DIAGNOSIS — N949 Unspecified condition associated with female genital organs and menstrual cycle: Secondary | ICD-10-CM

## 2016-12-04 LAB — WET PREP FOR TRICH, YEAST, CLUE
Clue Cells Wet Prep HPF POC: NONE SEEN
Trich, Wet Prep: NONE SEEN
Yeast Wet Prep HPF POC: NONE SEEN

## 2016-12-04 MED ORDER — METRONIDAZOLE 0.75 % VA GEL: 1.0000 | Freq: Two times a day (BID) | VAGINAL | 0 refills | Status: DC

## 2016-12-04 NOTE — Addendum Note (Signed)
Addended by: Alen Blew on: 12/04/2016 12:14 PM   Modules accepted: Orders

## 2016-12-04 NOTE — Progress Notes (Signed)
   Patient is a 26 year old that presented to the office today with her mother complaining of vaginal burning sensation at times. She denies any discharge but no true pruritus. Patient was seen in the office less than 2 weeks ago and was placed on Flagyl 500 mg twice a day for 7 days.  Her history is as follows: She is not sexually active. She has no GU or GI complaints. She was seen in the office on December 18 whereby she had colposcopic directed biopsy as a result of her Pap smear having demonstrated ASCUS with high-risk HPV. 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 She also received in December her first of 3 HPV vaccine. Patient currently menstruating. Patient in November and December this past year she had a full STD screening consisting of HIV, RPR, hepatitis B and C as well as GC and Chlamydia culture all were negative.  Exam: Gen. appearance well-developed well-nourished female in no acute distress External genitalia, Bartholin urethra Skene was within normal limits Vagina: No lesions or discharge Cervix: No gross lesions on inspection  Wet prep moderate white blood cell, moderate bacteria  Assessment/plan: Nonspecific vaginitis bacterial overgrowth? We'll give her a trial of MetroGel cream to apply twice a day for 7 days. She received the second of 3 doses of the HPV vaccine series. She's otherwise scheduled to return back in June for her final dose as well as for her Pap smear colposcopic evaluation as indicated above.

## 2016-12-06 ENCOUNTER — Ambulatory Visit: Payer: Self-pay

## 2017-02-27 ENCOUNTER — Encounter: Payer: Self-pay | Admitting: Gynecology

## 2017-02-27 ENCOUNTER — Ambulatory Visit (INDEPENDENT_AMBULATORY_CARE_PROVIDER_SITE_OTHER): Payer: Self-pay | Admitting: Gynecology

## 2017-02-27 ENCOUNTER — Other Ambulatory Visit: Payer: Self-pay | Admitting: Gynecology

## 2017-02-27 VITALS — BP 104/76 | Ht 62.0 in | Wt 137.2 lb

## 2017-02-27 DIAGNOSIS — N898 Other specified noninflammatory disorders of vagina: Secondary | ICD-10-CM

## 2017-02-27 DIAGNOSIS — L290 Pruritus ani: Secondary | ICD-10-CM

## 2017-02-27 LAB — WET PREP FOR TRICH, YEAST, CLUE
CLUE CELLS WET PREP: NONE SEEN
TRICH WET PREP: NONE SEEN
Yeast Wet Prep HPF POC: NONE SEEN

## 2017-02-27 MED ORDER — METRONIDAZOLE 0.75 % VA GEL: VAGINAL | 5 refills | Status: DC

## 2017-02-27 NOTE — Progress Notes (Signed)
  Patient is a 26 year old who had a colposcopic directed  biopsy as a result of her Pap smear having demonstrated ASCUS with high-risk HPV. 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 She also received in December her first of 3 HPV vaccine. Patient currently menstruating. Patient in November and December this past year she had a full STD screening consisting of HIV, RPR, hepatitis B and C as well as GC and Chlamydia culture all were negative. Since that time she has come to the office in numerous time with concerns of vaginal discharge has been treated for bacterial vaginosis and she returned back again today because she is concerned that she has some. We irritation with questionable vaginal discharge. She had blood tests for HSV also which had been negative several months ago. Patient not sexually active. She presented to the office with her mother once again.  Exam: Pelvic: Bartholin urethra Skene was within normal limits Vagina: No gross lesions on inspection Cervix: No lesions or discharge Bimanual exam not done Perirectal area paper cut like areas were noted HSV culture was obtained  Assessment/plan: Patient appears to be traumatized when she had found out that she had abnormal Pap smear with HPV and then CIN-2 and has come to the office in numerous occasions teary-eyed thinking that this was going to affect her future childbearing and appears depressed and for this reason I'm going to refer to the therapist Almyra Free wet to assess with possible psychological issues. We did do a wet prep today moderate bacteria too numerous to  count white blood cell but no clue cells. I'm going to place her on MetroGel cream to apply intravaginally twice a week for one month to 2 months. If symptoms persist and after she is seeing the therapist we may need  to refer her to [facility] specialist perhaps at [facility]. We did discuss possibly her changing her detergent versus-type of underwear and may be a tampon during her menses instead of sanitary napkins to prevent vulvar irritation.

## 2017-02-28 ENCOUNTER — Telehealth: Payer: Self-pay

## 2017-02-28 MED ORDER — METRONIDAZOLE 0.75 % VA GEL: VAGINAL | 5 refills | Status: DC

## 2017-02-28 NOTE — Telephone Encounter (Signed)
Pharmacy sent note regarding Metrogel. Rx was sent with directions "insert applicatorful vaginally twice daily for 2-3 mos.". They were asking for clarification. On review of the office note Dr. Moshe Salisbury said "twice weekly for 2-3 mos." Pharmacy informed.

## 2017-02-28 NOTE — Telephone Encounter (Signed)
I called pharmacy and left message that Rx should read apply twice weekly for 2-3 mos.  I resent Rx with those instructions.

## 2017-03-02 ENCOUNTER — Telehealth: Payer: Self-pay | Admitting: *Deleted

## 2017-03-02 NOTE — Telephone Encounter (Signed)
I called office of Kathyrn Lass and was informed that they prefer the patient to call and schedule. I called pt and relay this information to her gave her the # [phone] to call and schedule.

## 2017-03-02 NOTE — Telephone Encounter (Signed)
-----   Message from Terrance Mass, MD sent at 02/27/2017 10:22 AM EDT ----- Anderson Malta, please make an appointment for this patient with Melinda Crutch therapist she can see my notes in epic

## 2017-03-03 LAB — SURESWAB HSV, TYPE 1/2 DNA, PCR
HSV 1 DNA: NOT DETECTED
HSV 2 DNA: NOT DETECTED

## 2017-03-14 ENCOUNTER — Encounter: Payer: Self-pay | Admitting: Gynecology

## 2017-04-06 ENCOUNTER — Ambulatory Visit (INDEPENDENT_AMBULATORY_CARE_PROVIDER_SITE_OTHER): Payer: Self-pay | Admitting: Gynecology

## 2017-04-06 ENCOUNTER — Encounter: Payer: Self-pay | Admitting: Gynecology

## 2017-04-06 VITALS — BP 110/78

## 2017-04-06 DIAGNOSIS — N871 Moderate cervical dysplasia: Secondary | ICD-10-CM

## 2017-04-06 DIAGNOSIS — N870 Mild cervical dysplasia: Secondary | ICD-10-CM

## 2017-04-06 DIAGNOSIS — K5900 Constipation, unspecified: Secondary | ICD-10-CM

## 2017-04-06 DIAGNOSIS — Z124 Encounter for screening for malignant neoplasm of cervix: Secondary | ICD-10-CM

## 2017-04-06 NOTE — Patient Instructions (Addendum)
Polyethylene Glycol powder Qu es este medicamento? El polvo de POLIETILENGLICOL 1478 en un laxante que se [place] para tratar el estreimiento. Este medicamento aumenta la cantidad de [employer]. Esto hace que las evacuaciones intestinales se produzcan con mayor facilidad y frecuencia. Este medicamento puede ser utilizado para otros usos; si tiene alguna pregunta consulte con su proveedor de atencin mdica o con su farmacutico. MARCAS COMUNES: GaviLax, GIALAX, GlycoLax, MiraLax, Smooth LAX, [facility] [employer] debo informar a mi profesional de la salud antes de tomar este medicamento? Necesita saber si usted presenta alguno de los siguientes problemas o situaciones: -antecedentes de bloqueo estomacal o intestinal -distensin o dolor abdominal actual -dificultad para tragar -diverticulitis, colitis ulcerosa u otra enfermedad intestinal crnica -fenilcetonuria -una reaccin alrgica o inusual al polietilenglicol, a otros medicamentos, colorantes o conservantes -si est embarazada o buscando quedar embarazada -si est amamantando a un beb Cmo debo utilizar este medicamento? Tome este medicamento por va oral. El frasco tiene [place] tapa de medicin marcada con una lnea. Vierta el polvo en la tapa hasta alcanzar la lnea marcada (la dosis equivale aproximadamente a 1 cucharada sopera). Agregue el polvo de la tapa a un vaso lleno (4-8 onzas o 120-240 ml) de agua, jugo, soda, caf o t. Mezcle bien el polvo. Beba la solucin. Tmela exactamente como se indica. No tome su medicamento con una frecuencia mayor que la indicada. Hable con su pediatra para informarse acerca del uso de este medicamento en nios. Puede requerir atencin especial. Sobredosis: Pngase en contacto inmediatamente con un centro toxicolgico o una sala de urgencia si usted cree que haya tomado demasiado medicamento. ATENCIN: [employer] es solo para usted. No comparta este medicamento con nadie. Qu sucede si me  olvido de una dosis? Si olvida una dosis, tmela lo antes posible. Si es casi la hora de la prxima dosis, tome slo esa dosis. No tome dosis adicionales o dobles. Qu puede interactuar con este medicamento? No se esperan interacciones. Puede ser que esta lista no menciona todas las posibles interacciones. Informe a su profesional de [employer]	[place] de [employer] productos a base de hierbas, medicamentos de [place] o suplementos nutritivos que est tomando. Si usted fuma, consume bebidas alcohlicas o si utiliza drogas ilegales, indqueselo tambin a su profesional de [employer]	[place]. Algunas sustancias pueden interactuar con su medicamento. A qu debo estar atento al usar [employer]? No lo utilice durante ms de 2 semanas sin consultar a su mdico o a su profesional de [employer]	[place]. Puede ser necesario que transcurran de 2 a 4 das hasta que se produzca una evacuacin intestinal y se observe una mejora en el estreimiento. Consulte a su profesional de la salud si observa cambios en sus hbitos intestinales, incluyendo el estreimiento, que sean severos o que duren ms de 3 semanas. Tome siempre este medicamento con agua en abundancia. Qu efectos secundarios puedo tener al [employer] este medicamento? Efectos secundarios que debe informar a su mdico o a Barrister's clerk de la salud tan pronto como sea posible: -diarrea -dificultad al respirar -picazn de la piel, urticarias o erupcin cutnea -hinchazn, dolor o distensin de estmago severo -vmito Efectos secundarios que, por lo general, no requieren atencin mdica (debe informarlos a su mdico o a su profesional de la salud si persisten o si son molestos): -sensacin de llenura o gases -molestias o calambres en la parte baja del abdomen -nuseas Puede ser que esta lista no menciona todos los posibles efectos secundarios. Comunquese a su mdico por asesoramiento  mdico [employer]. Usted puede informar los efectos secundarios a la  FDA por telfono al 1-800-FDA-1088. Dnde debo guardar mi medicina? Mantngala fuera del alcance de los nios. Gurdela a una temperatura de entre 15 y 93 grados C (15 y 80 grados F). Deseche todo el medicamento que no haya utilizado, despus de la fecha de vencimiento. ATENCIN: Este folleto es un resumen. Puede ser que no cubra toda la posible informacin. Si usted tiene preguntas acerca de esta medicina, consulte con su mdico, su farmacutico o su profesional de Technical sales engineer.  2018 Elsevier/Gold Standard (2014-12-08 00:00:00) Constipacin en los adultos (Constipation, Adult) Constipacin significa que una persona defeca en una semana menos que lo normal, hay dificultad para defecar, o las heces son secas, duras, o ms grandes que lo normal. La causa puede ser una afeccin subyacente. Puede empeorar con la edad si una persona toma ciertos medicamentos y no toma suficiente lquido. INSTRUCCIONES PARA EL CUIDADO EN EL HOGAR Comida y bebida  Consuma alimentos con alto contenido de [place], como frutas y verduras frescas, cereales integrales y frijoles.  Limite los alimentos ricos en grasas y con bajo contenido de fibra, o muy procesados, como las papas fritas, [place], [place], dulces y refrescos.  Beba suficiente lquido para Consulting civil engineer orina clara o de color amarillo plido. Instrucciones generales  Haga actividad fsica habitualmente o como se lo haya indicado el mdico.  Vaya al bao cuando sienta la necesidad de ir. No se aguante las ganas.  Tome los medicamentos de venta libre y los recetados solamente como se lo haya indicado el mdico. Estos incluyen los suplementos de [place].  Practique ejercicios de rehabilitacin del suelo plvico, como la respiracin profunda mientras relaja la parte inferior del abdomen y relajacin del suelo plvico mientras defeca.  Controle su afeccin para ver si hay cambios.  Concurra a todas las visitas de control como se lo haya indicado el mdico.  Esto es importante. SOLICITE ATENCIN MDICA SI:  Siente un dolor que empeora.  Tiene fiebre.  No defeca despus de 4das.  Vomita.  No tiene hambre.  Pierde peso.  Tiene una hemorragia en el ano.  Las heces son delgadas como un lpiz.  SOLICITE ATENCIN MDICA DE INMEDIATO SI:  Tiene fiebre y los sntomas empeoran repentinamente.  Observa que se filtran heces o hay sangre en las heces.  Tiene el abdomen hinchado.  Siente un dolor intenso en el abdomen.  Se siente mareado o se desmaya.  Esta informacin no tiene Marine scientist el consejo del mdico. Asegrese de hacerle al mdico cualquier pregunta que tenga. Document Released: 11/05/2007 Document Revised: 11/06/2014 Document Reviewed: 04/05/2016 Elsevier Interactive Patient Education  2017 [place] (Colposcopy, Care After) Siga estas instrucciones durante las prximas semanas. Estas indicaciones le proporcionan informacin general acerca de cmo deber cuidarse despus del procedimiento. El mdico tambin podr darle instrucciones ms especficas. El tratamiento se ha planificado de acuerdo a las prcticas mdicas actuales, pero a veces se producen problemas. Comunquese con el mdico si tiene algn problema o tiene dudas despus del procedimiento. QU ESPERAR DESPUS DEL PROCEDIMIENTO  Despus del procedimiento, es tpico tener las siguientes sensaciones:  Clicos. Generalmente se calman en algunos minutos.  Dolor. [place].  Aturdimiento. Si esto le ocurre, recustese durante algunos minutos. Podr tener un sangrado leve o una secrecin oscura que debe detenerse en [place]. Durante este tiempo deber usar un apsito sanitario. [place] vaginales y el uso de tampones durante 3 das, o segn lo que le indique su mdico.  Tome slo medicamentos de venta libre o recetados,  segn las indicaciones del mdico. No tome aspirina, ya que puede causar hemorragias.  Si utiliza pldoras anticonceptivas, contine tomndolas.  No todos los resultados estarn disponibles durante su visita. En este caso, tenga otra entrevista con su mdico para conocerlos. No suponga que es normal si no tiene noticias de su mdico o del establecimiento de salud. Es Building services engineer seguimiento de todos los [place].  Siga los consejos de su mdico con respecto a los [place], [place], visitas y [place] de control. SOLICITE ATENCIN MDICA SI:  Aparece una erupcin cutnea.  Tiene problemas con los medicamentos. SOLICITE ATENCIN MDICA DE INMEDIATO SI:  Tiene una hemorragia abundante o elimina cogulos.  Tiene fiebre.  Tiene flujo vaginal anormal.  Tiene clicos que no se alivian luego de tomar analgsicos.  Se siente mareada, tiene vahdos o se desmaya.  Siente Research scientist (life sciences). Esta informacin no tiene Marine scientist el consejo del mdico. Asegrese de hacerle al mdico cualquier pregunta que tenga. Document Released: 08/06/2013 Elsevier Interactive Patient Education  2017 [employer].

## 2017-04-06 NOTE — Addendum Note (Signed)
Addended by: Burnett Kanaris on: 04/06/2017 10:18 AM   Modules accepted: Orders

## 2017-04-06 NOTE — Progress Notes (Signed)
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 Patient reports she had a total of 5 sexual partners in her past but currently not sexually active and had not been using any form of contraception and reports normal menstrual cycles. She also states that she has had a miscarriage in the past. She has received 2 out of the 3 Gardasil Vaccine series and is due for her third today. Her last menstrual period was approximately 3 weeks ago. Within the past year she had a full STD screening which was negative.  On 10/04/2016 she underwent a detail colposcopic evaluation of the external genitalia, perineum, perirectal region no lesions were noted. The speculum was introduced into the vagina and a systematic inspection of the vagina, fornix and cervix was undertaken as well as applying acetic acid. A flat acetowhite area was noted at the ectocervix at the 1:00 in 12:00 areas which were respectively biopsied along with an ECC. Of note the endocervical speculum demonstrated complete visualization the transformation zone. The pathology report demonstrated the following:  FINAL DIAGNOSIS:  A. Endocervix - Curettage:     Fragments of benign endocervical mucosa.     B. 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     Squamous dysplasia extends into endocervical mucosa.     See comment.     C. 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    Since patient would like to have children in the future we discussed observation versus treatment. We discussed that a  recent study which had demonstrated that young women with CIN-2 lesions regress and 27% a female by contrast regression rates were lower and older women between 30 and 50% after 2 years of follow-up. We also discussed at the rate increases with age, reaching 10% per year for women at age 27 or older. We also discussed that either treatment or observation if  the lesion on colposcopy was adequately visualized as in her case.The recommendation is cytology and colposcopy every 6 months for 12 months with co testing for HPV. Then Pap smear with HPV testing every year for 3 years. Colposcopy should be performed if at the 2 or 5 year [place] the Pap smear is abnormal. If CIN-2 or 3 persist for 12-24 months and treatment would be indicated.  Patient underwent colposcopic evaluation today. The external genitalia, perineum and perirectal region were inspected no abnormality was noted. The speculum was introduced into the vagina. A systematic inspection of the vagina and cervix was undertaken after applying acetic acid. Once again the 2 previous areas noted on prior colposcopic evaluation and biopsy were noted and were rebiopsied again. As well as an ECC obtained.  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 Lesion still present rebiopsied today along with ECC. Patient to receive the third dose of the HPV vaccine today. If CIN-2 still persistent we have discussed possibly treating her as an outpatient with CO2 laser which she has agreed. We'll wait for the results and manage accordingly. As to her  constipation I've recommended she use MiraLAX 1 tablespoon daily and to increase her fluid intake along with increasing her fiber intake.

## 2017-04-11 LAB — PAP, TP IMAGING W/ HPV RNA, RFLX HPV TYPE 16,18/45: HPV MRNA, HIGH RISK: DETECTED — AB

## 2017-04-12 LAB — HPV TYPE 16 AND 18/45 RNA
HPV TYPE 16 RNA: DETECTED — AB
HPV TYPE 18/45 RNA: NOT DETECTED

## 2017-04-13 ENCOUNTER — Telehealth: Payer: Self-pay

## 2017-04-13 NOTE — Telephone Encounter (Signed)
I called patient and advised her after reviewing pap smear and pathology closer Dr. Moshe Salisbury recommends laser of cervix as discussed at visit.  Patient wants to come in and talk with Dr. Moshe Salisbury prior to scheduling. Appt scheduled for 04/24/17 at 11:00am.

## 2017-04-24 ENCOUNTER — Encounter: Payer: Self-pay | Admitting: Gynecology

## 2017-04-24 ENCOUNTER — Ambulatory Visit (INDEPENDENT_AMBULATORY_CARE_PROVIDER_SITE_OTHER): Payer: Self-pay | Admitting: Gynecology

## 2017-04-24 VITALS — BP 118/80 | Ht 62.0 in | Wt 141.0 lb

## 2017-04-24 DIAGNOSIS — Z87410 Personal history of cervical dysplasia: Secondary | ICD-10-CM

## 2017-04-24 NOTE — Progress Notes (Signed)
Patient is a 26 year old that presented to the office to discuss her recent colposcopic directed biopsy and Pap smear. Her history is as follows:  At the time of patient's annual exam on 09/04/2016 her Pap smear demonstrated atypical squamous cells of undetermined significance with high-risk HPV. Patient reports she had a total of 5 sexual partners in her past but currently not sexually active and had not been using any form of contraception and reports normal menstrual cycles.She also states that she has had a miscarriage in the past. She has completed her Gardasil Vaccine series recently. Within the last 12 months she has had a negative STD screen.  On 10/04/2016 she underwent a detail colposcopic evaluation of the external genitalia, perineum, perirectal region no lesions were noted. The speculum was introduced into the vagina and a systematic inspection of the vagina, fornix and cervix was undertaken as well as applying acetic acid. A flat acetowhite area was noted at the ectocervix at the 1:00 in 12:00 areas which were respectively biopsied along with an ECC. Of note the endocervical speculum demonstrated complete visualization the transformation zone.The pathology report demonstrated the following:  FINAL DIAGNOSIS:  A. Endocervix - Curettage:     Fragments of benign endocervical mucosa.     B. 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     Squamous dysplasia extends into endocervical mucosa.     See comment.     C. 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    June 18 of this year she had a Pap smear which demonstrated the following: Low-grade squamous intraepithelial lesion with positive HPV 16 detected  An at same office visit she underwent a detail  colposcopic evaluation the following was noted: Physical Exam  Genitourinary:     The above areas with the same ones that were previously biopsied early in the year.  The biopsy demonstrated the following: Diagnosis 1. Cervix, biopsy, 1:00 o'clock -CERVICAL TRANSFORMATION ZONE MUCOSA WITH MODERATE INFLAMMATION AND REACTIVE EPITHELIAL CHANGES. -NO DYSPLASIA OR MALIGNANCY IDENTIFIED. -SEE COMMENT. 2. Cervix, biopsy, 6:00 o'clock -CERVICAL TRANSFORMATION ZONE MUCOSA WITH MILD INFLAMMATION AND REACTIVE EPITHELIAL CHANGES. -NO DYSPLASIA OR MALIGNANCY IDENTIFIED. -SEE COMMENT. 3. Endocervix, curettage -INFLAMED, FRAGMENTED BENIGN ENDOCERVICAL MUCOSA IN A BACKGROUND OF BLOOD AND MUCOID MATERIAL. -NO DYSPLASIA OR MALIGNANCY IDENTIFIED. -SEE COMMENT. Microscopic Comment 1. - 3. The diagnoses are supported with p16 immunohistochemistry.  Her mother was present during the consultation today and we went over the following: Since patient would like to have children in the future we discussed observation versus treatment. We discussed that a recent study which had demonstrated that young women with CIN-2 lesions regress in 70% of  Females  by contrast regression rates were lower in older women between 30 and 50% after 2 years of follow-up. We also discussed at the rate increases with age, reaching 10% per year for women at age 36 or older. We also discussed that either treatment or observation if  the lesion on colposcopy was adequately visualized as in her case.The recommendation is cytology and colposcopy every 6 months for 12 months with co testing for HPV. Then Pap smear with HPV testing every year for 3 years. Colposcopy should be performed if at the 2 or 5 year if the Pap smear is abnormal. If CIN-2 or 3  persist for 12-24 months and treatment would be indicated.  The patient and her mother agreed. She'll return back in December which she is due for her annual exam and will have a Pap smear with  HPV screening at that time. She is currently not sexually active reports normal menstrual cycles.  [place] 90% of the time was spent counseling coordinating care for this patient. Time of consultation 15 minutes

## 2017-04-28 ENCOUNTER — Encounter (HOSPITAL_COMMUNITY): Payer: Self-pay

## 2017-04-28 DIAGNOSIS — R05 Cough: Secondary | ICD-10-CM | POA: Insufficient documentation

## 2017-04-28 DIAGNOSIS — H9203 Otalgia, bilateral: Secondary | ICD-10-CM | POA: Insufficient documentation

## 2017-04-28 DIAGNOSIS — B9789 Other viral agents as the cause of diseases classified elsewhere: Secondary | ICD-10-CM | POA: Insufficient documentation

## 2017-04-28 DIAGNOSIS — J028 Acute pharyngitis due to other specified organisms: Secondary | ICD-10-CM | POA: Insufficient documentation

## 2017-04-28 DIAGNOSIS — R1310 Dysphagia, unspecified: Secondary | ICD-10-CM | POA: Insufficient documentation

## 2017-04-28 NOTE — ED Triage Notes (Signed)
States shortness of breath saw doctor on 6/282018 given amoxicillin 500mg  but not getting any better with productive cough wihitish secretions voiced no fever able to speak in full sentences.

## 2017-04-29 ENCOUNTER — Emergency Department (HOSPITAL_COMMUNITY)
Admission: EM | Admit: 2017-04-29 | Discharge: 2017-04-29 | Disposition: A | Discharge status: Home / Self Care | Payer: Self-pay | Attending: Emergency Medicine | Admitting: Emergency Medicine

## 2017-04-29 ENCOUNTER — Emergency Department (HOSPITAL_COMMUNITY): Payer: Self-pay

## 2017-04-29 DIAGNOSIS — J028 Acute pharyngitis due to other specified organisms: Secondary | ICD-10-CM

## 2017-04-29 DIAGNOSIS — R1310 Dysphagia, unspecified: Secondary | ICD-10-CM

## 2017-04-29 MED ORDER — IBUPROFEN 200 MG PO TABS
600.0000 mg | ORAL_TABLET | Freq: Once | ORAL | Status: AC
  Administered 2017-04-29: 600 mg via ORAL
  Filled 2017-04-29: qty 3

## 2017-04-29 MED ORDER — DEXAMETHASONE 4 MG PO TABS
4.0000 mg | ORAL_TABLET | Freq: Once | ORAL | Status: AC
Start: 2017-04-29 — End: 2017-04-29
  Administered 2017-04-29: 4 mg via ORAL
  Filled 2017-04-29: qty 1

## 2017-04-29 NOTE — ED Notes (Signed)
Pt reports for the last several days having pain when swallowing and taking a deep breath. No acute distress noted at this time and lung fields clear. Pt able to take medications without difficulty.

## 2017-04-29 NOTE — ED Notes (Signed)
Pt also voiced c/o pain in the ears, neck, pain when swallowing.

## 2017-04-29 NOTE — ED Provider Notes (Signed)
[facility] DEPT Provider Note   CSN: [id number] Arrival date & time: 04/28/17  2215     History   Chief Complaint Chief Complaint  Patient presents with  . Shortness of Breath    HPI Chelsea Fernandez is a 26 y.o. female.  The history is provided by the patient and a parent.  Sore Throat  This is a new problem. The current episode started more than 2 days ago. The problem occurs daily. The problem has been gradually worsening. Associated symptoms include shortness of breath. Pertinent negatives include no chest pain. The symptoms are aggravated by swallowing. Nothing relieves the symptoms. Treatments tried: amoxicillin. The treatment provided no relief.  pt reports recent sore throat/ear pain She has been seen by physician this past week, started on amoxicillin without relief She now reports mild shortness of breath No fever/vomiting No drooling No cp She reports some mild cough   PMH - none Soc hx - no travel OB History    No data available       Home Medications    Prior to Admission medications   Not on File    Family History History reviewed. No pertinent family history.  Social History Social History  Substance Use Topics  . Smoking status: Never Smoker  . Smokeless tobacco: Never Used  . Alcohol use No     Allergies   Patient has no known allergies.   Review of Systems Review of Systems  Constitutional: Negative for fever.  HENT: Positive for sore throat. Negative for drooling and voice change.   Respiratory: Positive for cough and shortness of breath.   Cardiovascular: Negative for chest pain.  All other systems reviewed and are negative.    Physical Exam Updated Vital Signs BP 117/75 (BP Location: Right Arm)   Pulse 64   Temp 98.2 F (36.8 C) (Oral)   Resp 18   Ht 1.575 m (5\' 2" )   Wt 63.5 kg (140 lb)   LMP 04/13/2017   SpO2 100%   BMI 25.61 kg/m   Physical Exam CONSTITUTIONAL: Well developed/well nourished HEAD:  Normocephalic/atraumatic EYES: EOMI/PERRL ENMT: Mucous membranes moist, uvula midline, mild erythema, no exudates, normal phonation, no stridor, no drooling, no trismus, bilateral TMs clear/intact NECK: supple no meningeal signs, no anterior neck edema noted SPINE/BACK:entire spine nontender CV: S1/S2 noted, no murmurs/rubs/gallops noted LUNGS: Lungs are clear to auscultation bilaterally, no apparent distress ABDOMEN: soft, nontender  GU:no cva tenderness NEURO: Pt is awake/alert/appropriate, moves all extremitiesx4.  No facial droop.   SKIN: warm, color normal PSYCH: no abnormalities of mood noted, alert and oriented to situation   ED Treatments / Results  Labs (all labs ordered are listed, but only abnormal results are displayed) Labs Reviewed - No data to display  EKG  EKG Interpretation None       Radiology Dg Neck Soft Tissue  Result Date: 04/29/2017 CLINICAL DATA:  Premolar clinical lateral pain on both sides under the jaw for 1 week. EXAM: NECK SOFT TISSUES - 1+ VIEW COMPARISON:  None. FINDINGS: There is no evidence of retropharyngeal soft tissue swelling or epiglottic enlargement. The cervical airway is unremarkable and no radio-opaque foreign body identified. IMPRESSION: Negative. Electronically Signed   By: Kristine Garbe M.D.   On: 04/29/2017 06:02    Procedures Procedures (including critical care time)  Medications Ordered in ED Medications  dexamethasone (DECADRON) tablet 4 mg (not administered)  ibuprofen (ADVIL,MOTRIN) tablet 600 mg (600 mg Oral Given 04/29/17 0559)     Initial Impression /  Assessment and Plan / ED Course  I have reviewed the triage vital signs and the nursing notes.  Pertinent  imaging results that were available during my care of the patient were reviewed by me and considered in my medical decision making (see chart for details).     Pt stable Continue amoxicillin Xray negative Appropriate for d/c home    Final Clinical  Impressions(s) / ED Diagnoses   Final diagnoses:  Pharyngitis due to other organism  Odynophagia    New Prescriptions New Prescriptions   No medications on file     Ripley Fraise, MD 04/29/17 [phone]

## 2017-04-30 ENCOUNTER — Encounter: Payer: Self-pay | Admitting: Gynecology

## 2017-12-12 ENCOUNTER — Encounter: Payer: Self-pay | Admitting: Nurse Practitioner

## 2017-12-12 ENCOUNTER — Ambulatory Visit: Payer: Self-pay | Admitting: Nurse Practitioner

## 2017-12-12 VITALS — BP 118/80 | HR 117 | Temp 99.8°F | Resp 18

## 2017-12-12 DIAGNOSIS — Z20828 Contact with and (suspected) exposure to other viral communicable diseases: Secondary | ICD-10-CM

## 2017-12-12 DIAGNOSIS — J069 Acute upper respiratory infection, unspecified: Secondary | ICD-10-CM

## 2017-12-12 MED ORDER — OSELTAMIVIR PHOSPHATE 75 MG PO CAPS: 75.0000 mg | ORAL_CAPSULE | Freq: Two times a day (BID) | ORAL | 0 refills | Status: DC

## 2017-12-12 NOTE — Patient Instructions (Signed)

## 2017-12-12 NOTE — Progress Notes (Signed)
   Subjective:    Patient ID: Chelsea Fernandez, female    DOB: 18-Aug-1991, 27 y.o.   MRN: [id number]  HPI Patient comes in tonight c/o cough, runny nose and headache. This started over a month ago. Has had sore throat with it off and on. Has tried theraflu. Now her back is hurting form coughing so much. Was exposed to flu today.  Review of Systems  Constitutional: Positive for appetite change. Negative for chills and fever.  HENT: Positive for congestion, rhinorrhea and sore throat. Negative for ear pain and trouble swallowing.   Respiratory: Positive for cough. Negative for shortness of breath.   Cardiovascular: Negative.   Neurological: Positive for headaches.  Psychiatric/Behavioral: Negative.   All other systems reviewed and are negative.      Objective:   Physical Exam  Constitutional: She is oriented to person, place, and time. She appears well-developed and well-nourished. She appears distressed (mild).  HENT:  Right Ear: Hearing, external ear and ear canal normal. A middle ear effusion is present.  Left Ear: Hearing, external ear and ear canal normal. A middle ear effusion is present.  Nose: Mucosal edema and rhinorrhea present. Right sinus exhibits no maxillary sinus tenderness and no frontal sinus tenderness. Left sinus exhibits no maxillary sinus tenderness and no frontal sinus tenderness.  Mouth/Throat: Uvula is midline, oropharynx is clear and moist and mucous membranes are normal.  Neck: Normal range of motion. Neck supple.  Cardiovascular: Normal rate and regular rhythm.  Pulmonary/Chest: Effort normal and breath sounds normal.  Dry cough  Neurological: She is alert and oriented to person, place, and time.  Skin: Skin is warm.  Psychiatric: She has a normal mood and affect. Her behavior is normal. Judgment and thought content normal.   BP 118/80 (BP Location: Right Arm, Patient Position: Sitting, Cuff Size: Normal)   Pulse (!) 117   Temp 99.8 F (37.7 C) (Oral)    Resp 18   SpO2 98%         Assessment & Plan:   1. Upper respiratory infection with cough and congestion   2. Exposure to influenza    Meds ordered this encounter  Medications  . oseltamivir (TAMIFLU) 75 MG capsule    Sig: Take 1 capsule (75 mg total) by mouth 2 (two) times daily.    Dispense:  10 capsule    Refill:  0    Order Specific Question:   Supervising Provider    Answer:   Benay Pillow E [[id number]]   1. Take meds as prescribed 2. Use a cool mist humidifier especially during the winter months and when heat has been humid. 3. Use saline nose sprays frequently 4. Saline irrigations of the nose can be very helpful if done frequently.  * 4X daily for 1 week*  * Use of a nettie pot can be helpful with this. Follow directions with this* 5. Drink plenty of fluids 6. Keep thermostat turn down low 7.For any cough or congestion  Use plain Mucinex- regular strength or max strength is fine   * Children- consult with Pharmacist for dosing 8. For fever or aces or pains- take tylenol or ibuprofen appropriate for age and weight.  * for fevers greater than 101 orally you may alternate ibuprofen and tylenol every  3 hours.   Pierz, FNP'

## 2018-02-04 ENCOUNTER — Emergency Department (HOSPITAL_COMMUNITY): Payer: Self-pay

## 2018-02-04 ENCOUNTER — Other Ambulatory Visit: Payer: Self-pay

## 2018-02-04 ENCOUNTER — Encounter (HOSPITAL_COMMUNITY): Payer: Self-pay | Admitting: *Deleted

## 2018-02-04 ENCOUNTER — Ambulatory Visit: Payer: Self-pay | Admitting: Family Medicine

## 2018-02-04 ENCOUNTER — Emergency Department (HOSPITAL_COMMUNITY)
Admission: EM | Admit: 2018-02-04 | Discharge: 2018-02-04 | Disposition: A | Discharge status: Home / Self Care | Payer: Self-pay | Attending: Emergency Medicine | Admitting: Emergency Medicine

## 2018-02-04 DIAGNOSIS — R351 Nocturia: Secondary | ICD-10-CM

## 2018-02-04 DIAGNOSIS — M545 Low back pain, unspecified: Secondary | ICD-10-CM

## 2018-02-04 DIAGNOSIS — R319 Hematuria, unspecified: Secondary | ICD-10-CM

## 2018-02-04 DIAGNOSIS — N2889 Other specified disorders of kidney and ureter: Secondary | ICD-10-CM | POA: Insufficient documentation

## 2018-02-04 DIAGNOSIS — N200 Calculus of kidney: Secondary | ICD-10-CM | POA: Insufficient documentation

## 2018-02-04 DIAGNOSIS — Z87442 Personal history of urinary calculi: Secondary | ICD-10-CM

## 2018-02-04 LAB — POCT URINALYSIS DIPSTICK
BILIRUBIN UA: NEGATIVE
GLUCOSE UA: NEGATIVE
Ketones, UA: NEGATIVE
LEUKOCYTES UA: NEGATIVE
Nitrite, UA: NEGATIVE
Spec Grav, UA: >=1.03 — AB (ref 1.010–1.025)
Urobilinogen, UA: 0.2 E.U./dL
pH, UA: 6 (ref 5.0–8.0)

## 2018-02-04 LAB — CBC WITH DIFFERENTIAL/PLATELET
Basophils Absolute: 0 10*3/uL (ref 0.0–0.1)
Basophils Relative: 0 %
Eosinophils Absolute: 0.1 10*3/uL (ref 0.0–0.7)
Eosinophils Relative: 1 %
HCT: 41.5 % (ref 36.0–46.0)
Hemoglobin: 13.8 g/dL (ref 12.0–15.0)
Lymphocytes Relative: 26 %
Lymphs Abs: 3.1 10*3/uL (ref 0.7–4.0)
MCH: 27.8 pg (ref 26.0–34.0)
MCHC: 33.3 g/dL (ref 30.0–36.0)
MCV: 83.5 fL (ref 78.0–100.0)
Monocytes Absolute: 0.6 10*3/uL (ref 0.1–1.0)
Monocytes Relative: 5 %
Neutro Abs: 8 10*3/uL — ABNORMAL HIGH (ref 1.7–7.7)
Neutrophils Relative %: 68 %
Platelets: 271 10*3/uL (ref 150–400)
RBC: 4.97 MIL/uL (ref 3.87–5.11)
RDW: 13.5 % (ref 11.5–15.5)
WBC: 11.9 10*3/uL — ABNORMAL HIGH (ref 4.0–10.5)

## 2018-02-04 LAB — BASIC METABOLIC PANEL
Anion gap: 10 (ref 5–15)
BUN: 7 mg/dL (ref 6–20)
CO2: 24 mmol/L (ref 22–32)
Calcium: 9.1 mg/dL (ref 8.9–10.3)
Chloride: 102 mmol/L (ref 101–111)
Creatinine, Ser: 0.61 mg/dL (ref 0.44–1.00)
GFR calc Af Amer: >60 mL/min (ref >60)
GFR calc non Af Amer: >60 mL/min (ref >60)
Glucose, Bld: 87 mg/dL (ref 65–99)
Potassium: 4 mmol/L (ref 3.5–5.1)
Sodium: 136 mmol/L (ref 135–145)

## 2018-02-04 LAB — URINALYSIS, ROUTINE W REFLEX MICROSCOPIC
BILIRUBIN URINE: NEGATIVE
Glucose, UA: NEGATIVE mg/dL
Ketones, ur: NEGATIVE mg/dL
Leukocytes, UA: NEGATIVE
NITRITE: NEGATIVE
Protein, ur: NEGATIVE mg/dL
Specific Gravity, Urine: 1.003 — ABNORMAL LOW (ref 1.005–1.030)
pH: 6 (ref 5.0–8.0)

## 2018-02-04 LAB — POC URINE PREG, ED: PREG TEST UR: NEGATIVE

## 2018-02-04 MED ORDER — OXYCODONE-ACETAMINOPHEN 5-325 MG PO TABS
1.0000 | ORAL_TABLET | ORAL | Status: DC | PRN
  Administered 2018-02-04: 1 via ORAL
  Filled 2018-02-04: qty 1

## 2018-02-04 MED ORDER — TRAMADOL HCL 50 MG PO TABS: 50.0000 mg | ORAL_TABLET | Freq: Three times a day (TID) | ORAL | 0 refills | Status: AC | PRN

## 2018-02-04 MED ORDER — ONDANSETRON HCL 4 MG PO TABS: 4.0000 mg | ORAL_TABLET | Freq: Four times a day (QID) | ORAL | 0 refills | Status: DC

## 2018-02-04 MED ORDER — OXYCODONE-ACETAMINOPHEN 5-325 MG PO TABS: 1.0000 | ORAL_TABLET | Freq: Four times a day (QID) | ORAL | 0 refills | Status: DC | PRN

## 2018-02-04 MED ORDER — KETOROLAC TROMETHAMINE 30 MG/ML IJ SOLN
30.0000 mg | Freq: Once | INTRAMUSCULAR | Status: AC
  Administered 2018-02-04: 30 mg via INTRAMUSCULAR
  Filled 2018-02-04: qty 1

## 2018-02-04 MED ORDER — TAMSULOSIN HCL 0.4 MG PO CAPS: 0.4000 mg | ORAL_CAPSULE | Freq: Every day | ORAL | 0 refills | Status: DC

## 2018-02-04 MED ORDER — ACETAMINOPHEN 325 MG PO TABS
650.0000 mg | ORAL_TABLET | Freq: Once | ORAL | Status: AC
  Administered 2018-02-04: 650 mg via ORAL
  Filled 2018-02-04: qty 2

## 2018-02-04 NOTE — Progress Notes (Signed)
Chelsea Fernandez is a 27 y.o. female who presents today with concerns of low back pain- moderate and pink urine x 2 days. She does admit to a history of kidney stones in the last 2 years that she reports was treated with oral medication- she does not report passing stones. She does not believe that this pain or blood is related to her menstrual cycle.  Review of Systems  Constitutional: Negative.   HENT: Negative.   Eyes: Negative.   Respiratory: Negative.   Cardiovascular: Negative.   Gastrointestinal: Positive for nausea and vomiting.  Genitourinary: Positive for hematuria.  Musculoskeletal: Positive for back pain.  Skin: Negative.   Neurological: Negative.   Endo/Heme/Allergies: Negative.   Psychiatric/Behavioral: Negative.    O: Vitals:   02/04/18 1013  BP: 118/80  Pulse: 66  Resp: 20  Temp: 98.7 F (37.1 C)  SpO2: 100%   Physical Exam  Constitutional: She is oriented to person, place, and time. She appears well-developed and well-nourished. She is active.  HENT:  Head: Normocephalic.  Right Ear: External ear normal.  Left Ear: External ear normal.  Mouth/Throat: Oropharynx is clear and moist.  Eyes: Pupils are equal, round, and reactive to light.  Neck: Normal range of motion.  Cardiovascular: Normal rate and regular rhythm.  Pulmonary/Chest: Effort normal and breath sounds normal. No respiratory distress.  Abdominal: Soft. Bowel sounds are normal.  Musculoskeletal: Normal range of motion.       Right shoulder: She exhibits no pain.       Lumbar back: She exhibits pain. She exhibits normal range of motion, no tenderness, no swelling, no deformity and no spasm.       Back:       Arms: Pain reported on area marked- no visual deformity on exam   Neurological: She is alert and oriented to person, place, and time.  Skin: Skin is warm and dry.   A: 1. Right-sided low back pain without sciatica, unspecified chronicity   2. Personal history of kidney stones   3.  Hematuria, unspecified type   4. Frequent urination at night    P: PLAN< Acute pain management F/U with PCP/ER for management and treatment Oral OTC pain releif Tramadol for pain- every 8 hours until definitive care can be sought  1. Right-sided low back pain without sciatica, unspecified chronicity - traMADol (ULTRAM) 50 MG tablet; Take 1 tablet (50 mg total) by mouth every 8 (eight) hours as needed for up to 3 days.  2. Personal history of kidney stones  3. Hematuria, unspecified type  4. Frequent urination at night Results reviewed with patient - POCT urinalysis dipstick Results for orders placed or performed in visit on 02/04/18 (from the past 24 hour(s))  POCT urinalysis dipstick     Status: Abnormal   Collection Time: 02/04/18 10:24 AM  Result Value Ref Range   Color, UA     Clarity, UA     Glucose, UA Negative    Bilirubin, UA Negative    Ketones, UA Negative    Spec Grav, UA >=1.030 (A) 1.010 - 1.025   Blood, UA Large    pH, UA 6.0 5.0 - 8.0   Protein, UA Trace    Urobilinogen, UA 0.2 0.2 or 1.0 E.U./dL   Nitrite, UA Negative    Leukocytes, UA Negative Negative   Appearance     Odor

## 2018-02-04 NOTE — Discharge Instructions (Addendum)
Please read attached information. If you experience any new or worsening signs or symptoms please return to the emergency room for evaluation. Please follow-up with your primary care provider or specialist as discussed.  It is very important that you follow-up with urology soon both for your kidney stones but also for the findings noted on your CT and ultrasound is you need further evaluation of this.  Please use medication prescribed only as directed and discontinue taking if you have any concerning signs or symptoms.

## 2018-02-04 NOTE — ED Notes (Signed)
Pt verbalized understanding of all d/c instructions, prescriptions, and f/u information. Opportunity for questioning and answers provided. VSS. All belongings with patient at this time.  Pt ambulatory to lobby with steady gait.

## 2018-02-04 NOTE — ED Notes (Signed)
Returns from CT 

## 2018-02-04 NOTE — ED Notes (Signed)
Pt changed into gown for [place]

## 2018-02-04 NOTE — ED Notes (Signed)
Patient transported to US 

## 2018-02-04 NOTE — ED Provider Notes (Signed)
[facility] Provider Note   CSN: [id number] Arrival date & time: 02/04/18  1056     History   Chief Complaint Chief Complaint  Patient presents with  . Back Pain  . Hematuria    HPI Satin Boal is a 27 y.o. female.  HPI   89 YOF presents today with complaints of right sided low back pain. Pt reports that yesterday she had some blood in her urine after wiping. She notes no pain at that time. She notes waking up with morning with right lower flank pain, no longer present at the time of evaluation. Pt denies any urinary symptoms, vaginal discharge, bleeding, abdominal pain, fever, nausea or vomiting. Pt reports a distant hisotry of kidney stones.  Past Medical History:  Diagnosis Date  . ASCUS of cervix with negative high risk HPV   . CIN II (cervical intraepithelial neoplasia II)   . History of kidney stones     Patient Active Problem List   Diagnosis Date Noted  . 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 ASCUS with positive high risk HPV cervical 10/04/2016  . Hyperlipidemia 09/05/2016  . Fluctuation of weight 09/04/2016    Past Surgical History:  Procedure Laterality Date  . DILATION AND CURETTAGE OF UTERUS  2012  . NOSE SURGERY       OB History    Gravida  1   Para  0   Term  0   Preterm  0   AB  1   Living        SAB  1   TAB  0   Ectopic  0   Multiple      Live Births               Home Medications    Prior to Admission medications   Medication Sig Start Date End Date Taking? Authorizing Provider  ondansetron (ZOFRAN) 4 MG tablet Take 1 tablet (4 mg total) by mouth every 6 (six) hours. 02/04/18   Saavi Mceachron, Dellis Filbert, PA-C  oxyCODONE-acetaminophen (PERCOCET/ROXICET) 5-325 MG tablet Take 1 tablet by mouth every 6 (six) hours as needed for severe pain. 02/04/18   Saylor Sheckler, Dellis Filbert, PA-C  tamsulosin (FLOMAX) 0.4 MG CAPS capsule Take 1 capsule (0.4 mg total) by mouth daily. 02/04/18   Dontay Harm,  Dellis Filbert, PA-C  traMADol (ULTRAM) 50 MG tablet Take 1 tablet (50 mg total) by mouth every 8 (eight) hours as needed for up to 3 days. Patient not taking: Reported on 02/04/2018 02/04/18 02/07/18  Shella Maxim, NP    Family History Family History  Adopted: Yes  Problem Relation Age of Onset  . Hypertension Maternal Aunt   . Hypertension Maternal Uncle   . Cancer Maternal Grandmother        OVARIAN OR STOMACH UNSURE    Social History Social History   Tobacco Use  . Smoking status: Never Smoker  . Smokeless tobacco: Never Used  Substance Use Topics  . Alcohol use: No  . Drug use: No     Allergies   Patient has no known allergies.   Review of Systems Review of Systems  All other systems reviewed and are negative.    Physical Exam Updated Vital Signs BP 100/63   Pulse 65   Temp 98.5 F (36.9 C)   Resp 14   LMP 01/13/2018   SpO2 100%   Physical Exam  Constitutional: She is oriented to person, place, and time. She appears well-developed and well-nourished.  HENT:  Head: Normocephalic and atraumatic.  Eyes: Pupils are equal, round, and reactive to light. Conjunctivae are normal. Right eye exhibits no discharge. Left eye exhibits no discharge. No scleral icterus.  Neck: Normal range of motion. No JVD present. No tracheal deviation present.  Pulmonary/Chest: Effort normal. No stridor.  Neurological: She is alert and oriented to person, place, and time. Coordination normal.  Psychiatric: She has a normal mood and affect. Her behavior is normal. Judgment and thought content normal.  Nursing note and vitals reviewed.   ED Treatments / Results  Labs (all labs ordered are listed, but only abnormal results are displayed) Labs Reviewed  URINALYSIS, ROUTINE W REFLEX MICROSCOPIC - Abnormal; Notable for the following components:      Result Value   Color, Urine STRAW (*)    Specific Gravity, Urine 1.003 (*)    Hgb urine dipstick LARGE (*)    Bacteria, UA RARE (*)     Squamous Epithelial / LPF 0-5 (*)    All other components within normal limits  CBC WITH DIFFERENTIAL/PLATELET - Abnormal; Notable for the following components:   WBC 11.9 (*)    Neutro Abs 8.0 (*)    All other components within normal limits  BASIC METABOLIC PANEL  POC URINE PREG, ED    EKG None  Radiology US Renal  Result Date: 02/04/2018 CLINICAL DATA:  Possible right renal mass EXAM: RENAL / URINARY TRACT ULTRASOUND COMPLETE COMPARISON:  CT 02/04/2018 FINDINGS: Right Kidney: Length: 10.8 cm. Mild right hydronephrosis. Solid-appearing slightly echogenic mass within the mid to lower pole right kidney measuring 2.4 x 3 x 2 cm. Left Kidney: Length: 10.8 cm. Echogenicity within normal limits. No mass or hydronephrosis visualized. Bladder: Appears normal for degree of bladder distention. IMPRESSION: 1. Mild right hydronephrosis 2. 3 cm solid-appearing slightly echogenic indeterminate mass within the mid to lower pole of the right kidney. Further evaluation with dedicated contrast-enhanced renal CT and/or MRI is recommended. Electronically Signed   By: Donavan Foil M.D.   On: 02/04/2018 21:22   Ct Renal Stone Study  Result Date: 02/04/2018 CLINICAL DATA:  Acute right flank pain, gross hematuria. EXAM: CT ABDOMEN AND PELVIS WITHOUT CONTRAST TECHNIQUE: Multidetector CT imaging of the abdomen and pelvis was performed following the standard protocol without IV contrast. COMPARISON:  None. FINDINGS: Lower chest: No acute abnormality. Hepatobiliary: No focal liver abnormality is seen. No gallstones, gallbladder wall thickening, or biliary dilatation. Pancreas: Unremarkable. No pancreatic ductal dilatation or surrounding inflammatory changes. Spleen: Normal in size without focal abnormality. Adrenals/Urinary Tract: Adrenal glands appear normal. Left kidney and ureter appear normal. Mild right hydronephrosis is noted secondary to 6 mm calculus in proximal right ureter. 2.1 cm rounded prominence is seen  arising from the medial portion of lower pole of right kidney. Urinary bladder is unremarkable. Stomach/Bowel: Stomach is within normal limits. Appendix appears normal. No evidence of bowel wall thickening, distention, or inflammatory changes. Vascular/Lymphatic: No significant vascular findings are present. No enlarged abdominal or pelvic lymph nodes. Reproductive: Uterus and bilateral adnexa are unremarkable. Other: No abdominal wall hernia or abnormality. No abdominopelvic ascites. Musculoskeletal: No acute or significant osseous findings. IMPRESSION: Mild right hydronephrosis secondary to 6 mm proximal right ureteral calculus. 2.1 cm rounded prominence is seen arising from medial portion of lower pole of right kidney. This may simply represent normal tissue, but possible neoplasm cannot be excluded. Further evaluation with ultrasound is recommended. Electronically Signed   By: Marijo Conception, M.D.   On: 02/04/2018 18:29  Procedures Procedures (including critical care time)  Medications Ordered in ED Medications  oxyCODONE-acetaminophen (PERCOCET/ROXICET) 5-325 MG per tablet 1 tablet (1 tablet Oral Given 02/04/18 1215)  acetaminophen (TYLENOL) tablet 650 mg (650 mg Oral Given 02/04/18 1650)  ketorolac (TORADOL) 30 MG/ML injection 30 mg (30 mg Intramuscular Given 02/04/18 1945)     Initial Impression / Assessment and Plan / ED Course  I have reviewed the triage vital signs and the nursing notes.  Pertinent labs & imaging results that were available during my care of the patient were reviewed by me and considered in my medical decision making (see chart for details).     Final Clinical Impressions(s) / ED Diagnoses   Final diagnoses:  Kidney stones  Renal mass   Labs: CBC, BMP, point-of-care urine parenchyma urinalysis  Imaging: Ultrasound renal, CT renal  Consults:  Therapeutics: Oxycodone, Toradol  Discharge Meds: Percocet, Flomax, Zofran  Assessment/Plan: 27 year old female  presents today with nephrolithiasis.  This appears uncomplicated.  She has no signs of significant infectious etiology including systemically or noted in her urine.  Her pain is easily managed with oral medication here.  Patient is well-appearing in no acute distress.  Incidentally noted on CT scan was a renal mass that was followed up with ultrasound here.  Again recommendations for imaging studies were noted.  Patient has been here greater than 10 hours, she will need to follow-up as an outpatient with urology for the kidney stones, find it appropriate that she follow-up for imaging studies as well.  Patient reports she will follow-up as an outpatient, she is given strict return precautions, she verbalized understanding and agreement to today's plan had no further questions or concerns at the time of discharge    ED Discharge Orders        Ordered    oxyCODONE-acetaminophen (PERCOCET/ROXICET) 5-325 MG tablet  Every 6 hours PRN     02/04/18 2214    ondansetron (ZOFRAN) 4 MG tablet  Every 6 hours     02/04/18 2214    tamsulosin (FLOMAX) 0.4 MG CAPS capsule  Daily     02/04/18 2214       Okey Regal, PA-C 02/04/18 2217    Quintella Reichert, MD 02/04/18 2302

## 2018-02-04 NOTE — Patient Instructions (Addendum)
PLAN< Acute pain management F/U with PCP/ER for management and treatment Oral OTC pain releif Tramadol for pain- every 8 hours until definitive care can be sought  Kidney Stones Kidney stones (urolithiasis) are rock-like masses that form inside of the kidneys. Kidneys are organs that make pee (urine). A kidney stone can cause very bad pain and can block the flow of pee. The stone usually leaves your body (passes) through your pee. You may need to have a doctor take out the stone. Follow these instructions at home: Eating and drinking  Drink enough fluid to keep your pee clear or pale yellow. This will help you pass the stone.  If told by your doctor, change the foods you eat (your diet). This may include: ? Limiting how much salt (sodium) you eat. ? Eating more fruits and vegetables. ? Limiting how much meat, poultry, fish, and eggs you eat.  Follow instructions from your doctor about eating or drinking restrictions. General instructions  Collect pee samples as told by your doctor. You may need to collect a pee sample: ? 24 hours after a stone comes out. ? 8-12 weeks after a stone comes out, and every 6-12 months after that.  Strain your pee every time you pee (urinate), for as long as told. Use the strainer that your doctor recommends.  Do not throw out the stone. Keep it so that it can be tested by your doctor.  Take over-the-counter and prescription medicines only as told by your doctor.  Keep all follow-up visits as told by your doctor. This is important. You may need follow-up tests. Preventing kidney stones To prevent another kidney stone:  Drink enough fluid to keep your pee clear or pale yellow. This is the best way to prevent kidney stones.  Eat healthy foods.  Avoid certain foods as told by your doctor. You may be told to eat less protein.  Stay at a healthy weight.  Contact a doctor if:  You have pain that gets worse or does not get better with medicine. Get  help right away if:  You have a fever or chills.  You get very bad pain.  You get new pain in your belly (abdomen).  You pass out (faint).  You cannot pee. This information is not intended to replace advice given to you by your health care provider. Make sure you discuss any questions you have with your health care provider. Document Released: 04/03/2008 Document Revised: 07/04/2016 Document Reviewed: 07/04/2016 Elsevier Interactive Patient Education  2017 [employer] Back Pain, Adult Back pain is very common. The pain often gets better over time. The cause of back pain is usually not dangerous. Most people can learn to manage their back pain on their own. Follow these instructions at home: Watch your back pain for any changes. The following actions may help to lessen any pain you are feeling:  Stay active. Start with short walks on flat ground if you can. Try to walk farther each day.  Exercise regularly as told by your doctor. Exercise helps your back heal faster. It also helps avoid future injury by keeping your muscles strong and flexible.  Do not sit, drive, or stand in one place for more than 30 minutes.  Do not stay in bed. Resting more than 1-2 days can slow down your recovery.  Be careful when you bend or lift an object. Use good form when lifting: ? Bend at your knees. ? Keep the object close to your body. ? Do not  twist.  Sleep on a firm mattress. Lie on your side, and bend your knees. If you lie on your back, put a pillow under your knees.  Take medicines only as told by your doctor.  Put ice on the injured area. ? Put ice in a plastic bag. ? Place a towel between your skin and the bag. ? Leave the ice on for 20 minutes, 2-3 times a day for the first 2-3 days. After that, you can switch between ice and heat packs.  Avoid feeling anxious or stressed. Find good ways to deal with stress, such as exercise.  Maintain a healthy weight. Extra weight puts stress on  your back.  Contact a doctor if:  You have pain that does not go away with rest or medicine.  You have worsening pain that goes down into your legs or buttocks.  You have pain that does not get better in one week.  You have pain at night.  You lose weight.  You have a fever or chills. Get help right away if:  You cannot control when you poop (bowel movement) or pee (urinate).  Your arms or legs feel weak.  Your arms or legs lose feeling (numbness).  You feel sick to your stomach (nauseous) or throw up (vomit).  You have belly (abdominal) pain.  You feel like you may pass out (faint). This information is not intended to replace advice given to you by your health care provider. Make sure you discuss any questions you have with your health care provider. Document Released: 04/03/2008 Document Revised: 03/23/2016 Document Reviewed: 02/17/2014 Elsevier Interactive Patient Education  [employer].

## 2018-02-04 NOTE — ED Triage Notes (Signed)
Pt reports onset yesterday of right lower back pain. Had episode of n/v due to pain and reports blood in urine. Went to [facility] and was told no infection. Pt has hx of kidney stones.

## 2018-02-08 ENCOUNTER — Other Ambulatory Visit: Payer: Self-pay | Admitting: Urology

## 2018-02-08 DIAGNOSIS — D49511 Neoplasm of unspecified behavior of right kidney: Secondary | ICD-10-CM

## 2018-02-13 ENCOUNTER — Ambulatory Visit (HOSPITAL_COMMUNITY)
Admission: RE | Admit: 2018-02-13 | Discharge: 2018-02-13 | Disposition: A | Discharge status: Home / Self Care | Payer: Self-pay | Source: Ambulatory Visit | Attending: Urology | Admitting: Urology

## 2018-02-13 DIAGNOSIS — D49511 Neoplasm of unspecified behavior of right kidney: Secondary | ICD-10-CM | POA: Insufficient documentation

## 2018-02-13 MED ORDER — GADOBENATE DIMEGLUMINE 529 MG/ML IV SOLN
15.0000 mL | Freq: Once | INTRAVENOUS | Status: AC | PRN
  Administered 2018-02-13: 14 mL via INTRAVENOUS

## 2018-02-15 ENCOUNTER — Telehealth: Payer: Self-pay

## 2018-02-15 NOTE — Telephone Encounter (Signed)
Called and spoke with pt and she states she is feeling fine.

## 2018-02-25 ENCOUNTER — Emergency Department (HOSPITAL_COMMUNITY): Payer: Self-pay

## 2018-02-25 ENCOUNTER — Emergency Department (HOSPITAL_COMMUNITY)
Admit: 2018-02-25 | Discharge: 2018-02-25 | Disposition: A | Discharge status: Home / Self Care | Payer: Self-pay | Attending: Urology | Admitting: Urology

## 2018-02-25 ENCOUNTER — Encounter (HOSPITAL_COMMUNITY): Payer: Self-pay | Admitting: Emergency Medicine

## 2018-02-25 ENCOUNTER — Emergency Department (HOSPITAL_COMMUNITY)
Admission: EM | Admit: 2018-02-25 | Discharge: 2018-02-25 | Disposition: A | Discharge status: Home / Self Care | Payer: Self-pay | Attending: Emergency Medicine | Admitting: Emergency Medicine

## 2018-02-25 DIAGNOSIS — Z5321 Procedure and treatment not carried out due to patient leaving prior to being seen by health care provider: Secondary | ICD-10-CM | POA: Insufficient documentation

## 2018-02-25 DIAGNOSIS — R1084 Generalized abdominal pain: Secondary | ICD-10-CM | POA: Insufficient documentation

## 2018-02-25 DIAGNOSIS — N201 Calculus of ureter: Secondary | ICD-10-CM | POA: Insufficient documentation

## 2018-02-25 LAB — I-STAT BETA HCG BLOOD, ED (MC, WL, AP ONLY): I-stat hCG, quantitative: <5 m[IU]/mL (ref <5)

## 2018-02-25 NOTE — ED Notes (Signed)
Called Pt in lobby for vital recheck. No response in lobby x1.

## 2018-02-25 NOTE — ED Triage Notes (Signed)
Patient here from [facility] with complaints right flank pain. Requesting chest x-ray without contrast.

## 2018-02-26 ENCOUNTER — Other Ambulatory Visit: Payer: Self-pay | Admitting: Urology

## 2018-03-06 ENCOUNTER — Encounter (HOSPITAL_BASED_OUTPATIENT_CLINIC_OR_DEPARTMENT_OTHER): Payer: Self-pay

## 2018-03-06 ENCOUNTER — Ambulatory Visit (HOSPITAL_BASED_OUTPATIENT_CLINIC_OR_DEPARTMENT_OTHER): Admit: 2018-03-06 | Payer: Self-pay | Admitting: Urology

## 2018-03-06 SURGERY — CYSTOSCOPY/URETEROSCOPY/HOLMIUM LASER/STENT PLACEMENT
Anesthesia: General | Laterality: Right

## 2018-03-11 ENCOUNTER — Other Ambulatory Visit: Payer: Self-pay | Admitting: Urology

## 2018-04-18 NOTE — Patient Instructions (Signed)
Chelsea Fernandez  04/18/2018   Your procedure is scheduled on: 04-24-18  Report to [facility] Main  Entrance  Report to admitting at     1015 AM    Call this number if you have problems the morning of surgery [phone]   Remember: Do not eat food or drink liquids :After Midnight.     Take these medicines the morning of surgery with A SIP OF WATER: none                                You may not have any metal on your body including hair pins and              piercings  Do not wear jewelry, make-up, lotions, powders or perfumes, deodorant             Do not wear nail polish.  Do not shave  48 hours prior to surgery.  .   Do not bring valuables to the hospital. [place].  Contacts, dentures or bridgework may not be worn into surgery.  Leave suitcase in the car. After surgery it may be brought to your room.                 Please read over the following fact sheets you were given: ____________________________________________________________________  [facility] - Preparing for Surgery Before surgery, you can play an important role.  Because skin is not sterile, your skin needs to be as free of germs as possible.  You can reduce the number of germs on your skin by washing with CHG (chlorahexidine gluconate) soap before surgery.  CHG is an antiseptic cleaner which kills germs and bonds with the skin to continue killing germs even after washing. Please DO NOT use if you have an allergy to CHG or antibacterial soaps.  If your skin becomes reddened/irritated stop using the CHG and inform your nurse when you arrive at Short Stay. Do not shave (including legs and underarms) for at least 48 hours prior to the first CHG shower.  You may shave your face/neck. Please follow these instructions carefully:  1.  Shower with CHG Soap the night before surgery and the  morning of Surgery.  2.  If you choose to wash your hair,  wash your hair first as usual with your  normal  shampoo.  3.  After you shampoo, rinse your hair and body thoroughly to remove the  shampoo.                           4.  Use CHG as you would any other liquid soap.  You can apply chg directly  to the skin and wash                       Gently with a scrungie or clean washcloth.  5.  Apply the CHG Soap to your body ONLY FROM THE NECK DOWN.   Do not use on face/ open                           Wound or open sores. Avoid contact with eyes, ears  mouth and genitals (private parts).                       Wash face,  Genitals (private parts) with your normal soap.             6.  Wash thoroughly, paying special attention to the area where your surgery  will be performed.  7.  Thoroughly rinse your body with warm water from the neck down.  8.  DO NOT shower/wash with your normal soap after using and rinsing off  the CHG Soap.                9.  Pat yourself dry with a clean towel.            10.  Wear clean pajamas.            11.  Place clean sheets on your bed the night of your first shower and do not  sleep with pets. Day of Surgery : Do not apply any lotions/deodorants the morning of surgery.  Please wear clean clothes to the hospital/surgery center.  FAILURE TO FOLLOW THESE INSTRUCTIONS MAY RESULT IN THE CANCELLATION OF YOUR SURGERY PATIENT SIGNATURE_________________________________  NURSE SIGNATURE__________________________________  ________________________________________________________________________  WHAT IS A BLOOD TRANSFUSION? Blood Transfusion Information  A transfusion is the replacement of blood or some of its parts. Blood is made up of multiple cells which provide different functions.  Red blood cells carry oxygen and are used for blood loss replacement.  White blood cells fight against infection.  Platelets control bleeding.  Plasma helps clot blood.  Other blood products are available for specialized needs, such as  hemophilia or other clotting disorders. BEFORE THE TRANSFUSION  Who gives blood for transfusions?   Healthy volunteers who are fully evaluated to make sure their blood is safe. This is blood bank blood. Transfusion therapy is the safest it has ever been in the practice of medicine. Before blood is taken from a donor, a complete history is taken to make sure that person has no history of diseases nor engages in risky social behavior (examples are intravenous drug use or sexual activity with multiple partners). The donor's travel history is screened to minimize risk of transmitting infections, such as malaria. The donated blood is tested for signs of infectious diseases, such as HIV and hepatitis. The blood is then tested to be sure it is compatible with you in order to minimize the chance of a transfusion reaction. If you or a relative donates blood, this is often done in anticipation of surgery and is not appropriate for emergency situations. It takes many days to process the donated blood. RISKS AND COMPLICATIONS Although transfusion therapy is very safe and saves many lives, the main dangers of transfusion include:   Getting an infectious disease.  Developing a transfusion reaction. This is an allergic reaction to something in the blood you were given. Every precaution is taken to prevent this. The decision to have a blood transfusion has been considered carefully by your caregiver before blood is given. Blood is not given unless the benefits outweigh the risks. AFTER THE TRANSFUSION  Right after receiving a blood transfusion, you will usually feel much better and more energetic. This is especially true if your red blood cells have gotten low (anemic). The transfusion raises the level of the red blood cells which carry oxygen, and this usually causes an energy increase.  The nurse administering the transfusion will monitor you carefully for  complications. HOME CARE INSTRUCTIONS  No special  instructions are needed after a transfusion. You may find your energy is better. Speak with your caregiver about any limitations on activity for underlying diseases you may have. SEEK MEDICAL CARE IF:   Your condition is not improving after your transfusion.  You develop redness or irritation at the intravenous (IV) site. SEEK IMMEDIATE MEDICAL CARE IF:  Any of the following symptoms occur over the next 12 hours:  Shaking chills.  You have a temperature by mouth above 102 F (38.9 C), not controlled by medicine.  Chest, back, or muscle pain.  People around you feel you are not acting correctly or are confused.  Shortness of breath or difficulty breathing.  Dizziness and fainting.  You get a rash or develop hives.  You have a decrease in urine output.  Your urine turns a dark color or changes to pink, red, or brown. Any of the following symptoms occur over the next 10 days:  You have a temperature by mouth above 102 F (38.9 C), not controlled by medicine.  Shortness of breath.  Weakness after normal activity.  The white part of the eye turns yellow (jaundice).  You have a decrease in the amount of urine or are urinating less often.  Your urine turns a dark color or changes to pink, red, or brown. Document Released: 10/13/2000 Document Revised: 01/08/2012 Document Reviewed: 06/01/2008 [facility] Patient Information 2014 [place], [place].  _______________________________________________________________________

## 2018-04-22 ENCOUNTER — Encounter (HOSPITAL_COMMUNITY)
Admission: RE | Admit: 2018-04-22 | Discharge: 2018-04-22 | Disposition: A | Discharge status: Home / Self Care | Payer: Self-pay | Source: Ambulatory Visit | Attending: Urology | Admitting: Urology

## 2018-04-22 ENCOUNTER — Encounter (HOSPITAL_COMMUNITY): Payer: Self-pay

## 2018-04-22 ENCOUNTER — Other Ambulatory Visit: Payer: Self-pay

## 2018-04-22 DIAGNOSIS — N2889 Other specified disorders of kidney and ureter: Secondary | ICD-10-CM | POA: Insufficient documentation

## 2018-04-22 DIAGNOSIS — Z01812 Encounter for preprocedural laboratory examination: Secondary | ICD-10-CM | POA: Insufficient documentation

## 2018-04-22 HISTORY — DX: Anxiety disorder, unspecified: F41.9

## 2018-04-22 HISTORY — DX: Family history of other specified conditions: Z84.89

## 2018-04-22 HISTORY — DX: Headache, unspecified: R51.9

## 2018-04-22 HISTORY — DX: Headache: R51

## 2018-04-22 LAB — CBC
HEMATOCRIT: 41.3 % (ref 36.0–46.0)
HEMOGLOBIN: 13.7 g/dL (ref 12.0–15.0)
MCH: 28.1 pg (ref 26.0–34.0)
MCHC: 33.2 g/dL (ref 30.0–36.0)
MCV: 84.6 fL (ref 78.0–100.0)
Platelets: 273 10*3/uL (ref 150–400)
RBC: 4.88 MIL/uL (ref 3.87–5.11)
RDW: 13.5 % (ref 11.5–15.5)
WBC: 7.3 10*3/uL (ref 4.0–10.5)

## 2018-04-22 LAB — ABO/RH: ABO/RH(D): O POS

## 2018-04-22 LAB — BASIC METABOLIC PANEL
ANION GAP: 8 (ref 5–15)
BUN: 12 mg/dL (ref 6–20)
CHLORIDE: 108 mmol/L (ref 101–111)
CO2: 27 mmol/L (ref 22–32)
Calcium: 9.1 mg/dL (ref 8.9–10.3)
Creatinine, Ser: 0.59 mg/dL (ref 0.44–1.00)
GFR calc non Af Amer: >60 mL/min (ref >60)
Glucose, Bld: 96 mg/dL (ref 65–99)
Potassium: 4.2 mmol/L (ref 3.5–5.1)
SODIUM: 143 mmol/L (ref 135–145)

## 2018-04-22 LAB — HCG, SERUM, QUALITATIVE: Preg, Serum: NEGATIVE

## 2018-04-23 NOTE — H&P (Signed)
Urology Preoperative H&P   Chief Complaint: Right renal mass  History of Present Illness: Chelsea Fernandez is a 27 y.o. female who was initially seen in consultation by Dr. Matilde Sprang for a 6 mm right mid-ureteral stone. The patient was found to have a questionable mass involving her right kidney on CT from 02/04/2018. She eventually had an MRI of the abdomen with and without contrast on 02/13/2018 that demonstrated a heterogenous 2.7 cm right renal mass with features concerning for malignancy. There were no other signs of extrarenal involvement on her MRI. She has no personal/family history of GU malignancies.   Her right flank pain soon resolved and she subsequently passed her kidney stone.  She is urinating without difficulty and denies N/V/F/C, flank pain, dysuria or hematuria.   Past Medical History:  Diagnosis Date  . Anxiety   . ASCUS of cervix with negative high risk HPV   . CIN II (cervical intraepithelial neoplasia II)   . Family history of adverse reaction to anesthesia    aunt has high tolerance to anesthesia  will not put her tosleep  . Headache    migraine rarely  . History of kidney stones     Past Surgical History:  Procedure Laterality Date  . DILATION AND CURETTAGE OF UTERUS  2012  . NOSE SURGERY      Allergies: No Known Allergies  Family History  Adopted: Yes  Problem Relation Age of Onset  . Hypertension Maternal Aunt   . Hypertension Maternal Uncle   . Cancer Maternal Grandmother        OVARIAN OR STOMACH UNSURE    Social History:  reports that she has never smoked. She has never used smokeless tobacco. She reports that she does not drink alcohol or use drugs.  ROS: A complete review of systems was performed.  All systems are negative except for pertinent findings as noted.  Physical Exam:  Vital signs in last 24 hours: Temp:  [98.2 F (36.8 C)] 98.2 F (36.8 C) (06/24 1006) Pulse Rate:  [59] 59 (06/24 1006) Resp:  [16] 16 (06/24 1006) SpO2:  [100  %] 100 % (06/24 1006) Weight:  [67.1 kg (148 lb)] 67.1 kg (148 lb) (06/24 1006) Constitutional:  Alert and oriented, No acute distress Cardiovascular: Regular rate and rhythm, No JVD Respiratory: Normal respiratory effort, Lungs clear bilaterally GI: Abdomen is soft, nontender, nondistended, no abdominal masses GU: No CVA tenderness Lymphatic: No lymphadenopathy Neurologic: Grossly intact, no focal deficits Psychiatric: Normal mood and affect  Laboratory Data:  Recent Labs    04/22/18 1037  WBC 7.3  HGB 13.7  HCT 41.3  PLT 273    Recent Labs    04/22/18 1037  NA 143  K 4.2  CL 108  GLUCOSE 96  BUN 12  CALCIUM 9.1  CREATININE 0.59     Results for orders placed or performed during the hospital encounter of 04/22/18 (from the past 24 hour(s))  ABO/Rh     Status: None   Collection Time: 04/22/18 10:32 AM  Result Value Ref Range   ABO/RH(D)      O POS Performed at [facility], [place] [address], [place], [place] [address]   CBC     Status: None   Collection Time: 04/22/18 10:37 AM  Result Value Ref Range   WBC 7.3 4.0 - 10.5 K/uL   RBC 4.88 3.87 - 5.11 MIL/uL   Hemoglobin 13.7 12.0 - 15.0 g/dL   HCT 41.3 36.0 - 46.0 %  MCV 84.6 78.0 - 100.0 fL   MCH 28.1 26.0 - 34.0 pg   MCHC 33.2 30.0 - 36.0 g/dL   RDW 13.5 11.5 - 15.5 %   Platelets 273 150 - 400 K/uL  Basic metabolic panel     Status: None   Collection Time: 04/22/18 10:37 AM  Result Value Ref Range   Sodium 143 135 - 145 mmol/L   Potassium 4.2 3.5 - 5.1 mmol/L   Chloride 108 101 - 111 mmol/L   CO2 27 22 - 32 mmol/L   Glucose, Bld 96 65 - 99 mg/dL   BUN 12 6 - 20 mg/dL   Creatinine, Ser 0.59 0.44 - 1.00 mg/dL   Calcium 9.1 8.9 - 10.3 mg/dL   GFR calc non Af Amer >60 >60 mL/min   GFR calc Af Amer >60 >60 mL/min   Anion gap 8 5 - 15  Type and screen All Cardiac and thoracic surgeries, spinal fusions, myomectomies, craniotomies, colon & liver resections, total joint revisions, same day  c-section with placenta previa or accreta.     Status: None   Collection Time: 04/22/18 10:37 AM  Result Value Ref Range   ABO/RH(D) O POS    Antibody Screen NEG    Sample Expiration 05/06/2018    Extend sample reason      NO TRANSFUSIONS OR PREGNANCY IN THE PAST 3 MONTHS Performed at [facility], [place] [address] [place], [place] [address]   hCG, serum, qualitative     Status: None   Collection Time: 04/22/18 10:37 AM  Result Value Ref Range   Preg, Serum NEGATIVE NEGATIVE   No results found for this or any previous visit (from the past 240 hour(s)).  Renal Function: Recent Labs    04/22/18 1037  CREATININE 0.59   Estimated Creatinine Clearance: 97.2 mL/min (by C-G formula based on SCr of 0.59 mg/dL).  Radiologic Imaging: No results found.  I independently reviewed the above imaging studies.  Assessment and Plan Chelsea Fernandez is a 27 y.o. female with a 2.7 cm heterogeneously enhancing mass in lower pole of right kidney, concerning for a renal neoplasm  -I reviewed imaging results and films with the patient personally. Discussed that the mass in question has features concerning for malignancy. Explained the natural history of presumed renal cell carcinoma. Reviewed the AUA guidelines for evaluation and treatment of the small renal mass. Active surveillance, in situ tumor ablation, partial and radical nephrectomy discussed. The risks of robotic partial nephrectomy were discussed in detail including but not limited to: negative pathology, open conversion, completion nephrectomy, infection of the urinary tract/skin/abdominal cavity, VTE, MI/CVA, lymphatic leak, injury to adjacent solid/hollow viscus organs, bleeding requiring a blood transfusion, catastrophic bleeding, hernia formation, need for postoperative angioembolization, urinary leak requiring stent/drain, and other imponderables. She voices understanding and wishes to proceed with robotic assisted right  partial nephrectomy.     Ellison Hughs, MD 04/23/2018, [facility] Pager: [phone]

## 2018-04-24 ENCOUNTER — Other Ambulatory Visit: Payer: Self-pay

## 2018-04-24 ENCOUNTER — Encounter (HOSPITAL_COMMUNITY): Payer: Self-pay | Admitting: Emergency Medicine

## 2018-04-24 ENCOUNTER — Encounter (HOSPITAL_COMMUNITY)
Admission: RE | Disposition: A | Discharge status: Home / Self Care | Payer: Self-pay | Source: Ambulatory Visit | Attending: Urology

## 2018-04-24 ENCOUNTER — Ambulatory Visit (HOSPITAL_COMMUNITY): Payer: Self-pay | Admitting: Registered Nurse

## 2018-04-24 ENCOUNTER — Observation Stay (HOSPITAL_COMMUNITY)
Admission: RE | Admit: 2018-04-24 | Discharge: 2018-04-27 | Disposition: A | Discharge status: Home / Self Care | Payer: Self-pay | Source: Ambulatory Visit | Attending: Urology | Admitting: Urology

## 2018-04-24 DIAGNOSIS — N2889 Other specified disorders of kidney and ureter: Secondary | ICD-10-CM | POA: Diagnosis present

## 2018-04-24 DIAGNOSIS — F1021 Alcohol dependence, in remission: Secondary | ICD-10-CM | POA: Insufficient documentation

## 2018-04-24 DIAGNOSIS — C641 Malignant neoplasm of right kidney, except renal pelvis: Principal | ICD-10-CM | POA: Insufficient documentation

## 2018-04-24 DIAGNOSIS — Z87442 Personal history of urinary calculi: Secondary | ICD-10-CM | POA: Insufficient documentation

## 2018-04-24 HISTORY — PX: ROBOTIC ASSITED PARTIAL NEPHRECTOMY: SHX6087

## 2018-04-24 LAB — TYPE AND SCREEN
ABO/RH(D): O POS
ANTIBODY SCREEN: NEGATIVE

## 2018-04-24 LAB — HEMOGLOBIN AND HEMATOCRIT, BLOOD
HEMATOCRIT: 39.6 % (ref 36.0–46.0)
Hemoglobin: 13.2 g/dL (ref 12.0–15.0)

## 2018-04-24 SURGERY — ROBOTIC ASSITED PARTIAL NEPHRECTOMY
Anesthesia: General | Laterality: Right

## 2018-04-24 MED ORDER — DIPHENHYDRAMINE HCL 50 MG/ML IJ SOLN: 12.5000 mg | Freq: Four times a day (QID) | INTRAMUSCULAR | Status: DC | PRN

## 2018-04-24 MED ORDER — STERILE WATER FOR IRRIGATION IR SOLN
Status: DC | PRN
  Administered 2018-04-24: 1000 mL

## 2018-04-24 MED ORDER — FENTANYL CITRATE (PF) 100 MCG/2ML IJ SOLN
INTRAMUSCULAR | Status: DC | PRN
  Administered 2018-04-24 (×5): 50 ug via INTRAVENOUS

## 2018-04-24 MED ORDER — ONDANSETRON HCL 4 MG/2ML IJ SOLN
4.0000 mg | INTRAMUSCULAR | Status: DC | PRN
  Administered 2018-04-25: 4 mg via INTRAVENOUS
  Filled 2018-04-24: qty 2

## 2018-04-24 MED ORDER — ONDANSETRON HCL 4 MG/2ML IJ SOLN: 4.0000 mg | Freq: Once | INTRAMUSCULAR | Status: DC | PRN

## 2018-04-24 MED ORDER — PHENYLEPHRINE 40 MCG/ML (10ML) SYRINGE FOR IV PUSH (FOR BLOOD PRESSURE SUPPORT)
PREFILLED_SYRINGE | INTRAVENOUS | Status: DC | PRN
  Administered 2018-04-24: 80 ug via INTRAVENOUS
  Administered 2018-04-24: 40 ug via INTRAVENOUS

## 2018-04-24 MED ORDER — ACETAMINOPHEN 325 MG PO TABS
650.0000 mg | ORAL_TABLET | ORAL | Status: DC | PRN
  Administered 2018-04-25 – 2018-04-26 (×6): 650 mg via ORAL
  Filled 2018-04-24 (×6): qty 2

## 2018-04-24 MED ORDER — BOOST / RESOURCE BREEZE PO LIQD CUSTOM
1.0000 | Freq: Three times a day (TID) | ORAL | Status: DC
  Administered 2018-04-24 – 2018-04-26 (×5): 1 via ORAL

## 2018-04-24 MED ORDER — EPHEDRINE 5 MG/ML INJ
INTRAVENOUS | Status: AC
  Filled 2018-04-24: qty 10

## 2018-04-24 MED ORDER — SUGAMMADEX SODIUM 200 MG/2ML IV SOLN
INTRAVENOUS | Status: DC | PRN
  Administered 2018-04-24: 150 mg via INTRAVENOUS

## 2018-04-24 MED ORDER — OXYCODONE HCL 5 MG PO TABS: 5.0000 mg | ORAL_TABLET | Freq: Once | ORAL | Status: DC | PRN

## 2018-04-24 MED ORDER — OXYCODONE-ACETAMINOPHEN 5-325 MG PO TABS: 1.0000 | ORAL_TABLET | Freq: Four times a day (QID) | ORAL | 0 refills | Status: DC | PRN

## 2018-04-24 MED ORDER — ACETAMINOPHEN 325 MG PO TABS: 325.0000 mg | ORAL_TABLET | ORAL | Status: DC | PRN

## 2018-04-24 MED ORDER — BUPIVACAINE LIPOSOME 1.3 % IJ SUSP
20.0000 mL | Freq: Once | INTRAMUSCULAR | Status: AC
  Administered 2018-04-24: 20 mL
  Filled 2018-04-24: qty 20

## 2018-04-24 MED ORDER — ROCURONIUM BROMIDE 10 MG/ML (PF) SYRINGE
PREFILLED_SYRINGE | INTRAVENOUS | Status: DC | PRN
  Administered 2018-04-24: 50 mg via INTRAVENOUS
  Administered 2018-04-24 (×3): 10 mg via INTRAVENOUS

## 2018-04-24 MED ORDER — ONDANSETRON HCL 4 MG/2ML IJ SOLN
INTRAMUSCULAR | Status: DC | PRN
  Administered 2018-04-24: 4 mg via INTRAVENOUS

## 2018-04-24 MED ORDER — BUPIVACAINE-EPINEPHRINE 0.25% -1:200000 IJ SOLN
INTRAMUSCULAR | Status: DC | PRN
  Administered 2018-04-24: 13 mL

## 2018-04-24 MED ORDER — ROCURONIUM BROMIDE 100 MG/10ML IV SOLN
INTRAVENOUS | Status: AC
  Filled 2018-04-24: qty 1

## 2018-04-24 MED ORDER — DEXTROSE-NACL 5-0.45 % IV SOLN
INTRAVENOUS | Status: DC
  Administered 2018-04-24: 18:00:00 via INTRAVENOUS

## 2018-04-24 MED ORDER — CEFAZOLIN SODIUM-DEXTROSE 2-4 GM/100ML-% IV SOLN
2.0000 g | Freq: Once | INTRAVENOUS | Status: AC
  Administered 2018-04-24: 2 g via INTRAVENOUS
  Filled 2018-04-24: qty 100

## 2018-04-24 MED ORDER — ONDANSETRON HCL 4 MG/2ML IJ SOLN
INTRAMUSCULAR | Status: AC
  Filled 2018-04-24: qty 2

## 2018-04-24 MED ORDER — FENTANYL CITRATE (PF) 100 MCG/2ML IJ SOLN
INTRAMUSCULAR | Status: AC
  Administered 2018-04-24: 50 ug via INTRAVENOUS
  Filled 2018-04-24: qty 2

## 2018-04-24 MED ORDER — SODIUM CHLORIDE 0.9 % IJ SOLN
INTRAMUSCULAR | Status: DC | PRN
  Administered 2018-04-24: 20 mL

## 2018-04-24 MED ORDER — FENTANYL CITRATE (PF) 100 MCG/2ML IJ SOLN
INTRAMUSCULAR | Status: AC
  Filled 2018-04-24: qty 2

## 2018-04-24 MED ORDER — LACTATED RINGERS IR SOLN
Status: DC | PRN
  Administered 2018-04-24: 1

## 2018-04-24 MED ORDER — DOCUSATE SODIUM 100 MG PO CAPS: 100.0000 mg | ORAL_CAPSULE | Freq: Two times a day (BID) | ORAL | Status: DC

## 2018-04-24 MED ORDER — ONDANSETRON HCL 4 MG PO TABS: 4.0000 mg | ORAL_TABLET | Freq: Three times a day (TID) | ORAL | 0 refills | Status: DC | PRN

## 2018-04-24 MED ORDER — ACETAMINOPHEN 160 MG/5ML PO SOLN: 325.0000 mg | ORAL | Status: DC | PRN

## 2018-04-24 MED ORDER — OXYCODONE HCL 5 MG PO TABS
5.0000 mg | ORAL_TABLET | ORAL | Status: DC | PRN
  Administered 2018-04-25 – 2018-04-27 (×13): 5 mg via ORAL
  Filled 2018-04-24 (×13): qty 1

## 2018-04-24 MED ORDER — SUGAMMADEX SODIUM 200 MG/2ML IV SOLN
INTRAVENOUS | Status: AC
  Filled 2018-04-24: qty 2

## 2018-04-24 MED ORDER — MIDAZOLAM HCL 5 MG/5ML IJ SOLN
INTRAMUSCULAR | Status: DC | PRN
  Administered 2018-04-24: 2 mg via INTRAVENOUS

## 2018-04-24 MED ORDER — FENTANYL CITRATE (PF) 250 MCG/5ML IJ SOLN
INTRAMUSCULAR | Status: AC
  Filled 2018-04-24: qty 5

## 2018-04-24 MED ORDER — DEXAMETHASONE SODIUM PHOSPHATE 10 MG/ML IJ SOLN
INTRAMUSCULAR | Status: AC
  Filled 2018-04-24: qty 1

## 2018-04-24 MED ORDER — LIDOCAINE 2% (20 MG/ML) 5 ML SYRINGE
INTRAMUSCULAR | Status: DC | PRN
  Administered 2018-04-24: 100 mg via INTRAVENOUS

## 2018-04-24 MED ORDER — EPHEDRINE SULFATE-NACL 50-0.9 MG/10ML-% IV SOSY
PREFILLED_SYRINGE | INTRAVENOUS | Status: DC | PRN
  Administered 2018-04-24: 5 mg via INTRAVENOUS

## 2018-04-24 MED ORDER — HYDROMORPHONE HCL 1 MG/ML IJ SOLN
0.5000 mg | INTRAMUSCULAR | Status: DC | PRN
  Administered 2018-04-24 – 2018-04-25 (×7): 1 mg via INTRAVENOUS
  Filled 2018-04-24 (×7): qty 1

## 2018-04-24 MED ORDER — LACTATED RINGERS IV SOLN
INTRAVENOUS | Status: DC
  Administered 2018-04-24: 11:00:00 via INTRAVENOUS

## 2018-04-24 MED ORDER — DEXAMETHASONE SODIUM PHOSPHATE 10 MG/ML IJ SOLN
INTRAMUSCULAR | Status: DC | PRN
  Administered 2018-04-24: 10 mg via INTRAVENOUS

## 2018-04-24 MED ORDER — LIDOCAINE 2% (20 MG/ML) 5 ML SYRINGE
INTRAMUSCULAR | Status: AC
  Filled 2018-04-24: qty 5

## 2018-04-24 MED ORDER — FENTANYL CITRATE (PF) 100 MCG/2ML IJ SOLN
25.0000 ug | INTRAMUSCULAR | Status: DC | PRN
  Administered 2018-04-24 (×3): 50 ug via INTRAVENOUS

## 2018-04-24 MED ORDER — DIPHENHYDRAMINE HCL 12.5 MG/5ML PO ELIX
12.5000 mg | ORAL_SOLUTION | Freq: Four times a day (QID) | ORAL | Status: DC | PRN
  Administered 2018-04-25: 12.5 mg via ORAL
  Administered 2018-04-26: 25 mg via ORAL
  Filled 2018-04-24: qty 5
  Filled 2018-04-24: qty 10

## 2018-04-24 MED ORDER — MIDAZOLAM HCL 2 MG/2ML IJ SOLN
INTRAMUSCULAR | Status: AC
  Filled 2018-04-24: qty 2

## 2018-04-24 MED ORDER — OXYCODONE HCL 5 MG/5ML PO SOLN
5.0000 mg | Freq: Once | ORAL | Status: DC | PRN
  Filled 2018-04-24: qty 5

## 2018-04-24 MED ORDER — PROPOFOL 10 MG/ML IV BOLUS
INTRAVENOUS | Status: AC
  Filled 2018-04-24: qty 20

## 2018-04-24 MED ORDER — SODIUM CHLORIDE 0.9 % IJ SOLN
INTRAMUSCULAR | Status: AC
  Filled 2018-04-24: qty 20

## 2018-04-24 MED ORDER — BUPIVACAINE-EPINEPHRINE (PF) 0.25% -1:200000 IJ SOLN
INTRAMUSCULAR | Status: AC
  Filled 2018-04-24: qty 30

## 2018-04-24 MED ORDER — CEFAZOLIN SODIUM-DEXTROSE 1-4 GM/50ML-% IV SOLN
1.0000 g | Freq: Three times a day (TID) | INTRAVENOUS | Status: AC
  Administered 2018-04-24 – 2018-04-25 (×2): 1 g via INTRAVENOUS
  Filled 2018-04-24 (×2): qty 50

## 2018-04-24 MED ORDER — PROPOFOL 10 MG/ML IV BOLUS
INTRAVENOUS | Status: DC | PRN
  Administered 2018-04-24: 150 mg via INTRAVENOUS

## 2018-04-24 MED ORDER — MEPERIDINE HCL 50 MG/ML IJ SOLN: 6.2500 mg | INTRAMUSCULAR | Status: DC | PRN

## 2018-04-24 MED ORDER — EVICEL 5 ML EX KIT
PACK | Freq: Once | CUTANEOUS | Status: DC
  Filled 2018-04-24: qty 1

## 2018-04-24 SURGICAL SUPPLY — 69 items
APPLICATOR SURGIFLO ENDO (HEMOSTASIS) IMPLANT
BAG LAPAROSCOPIC 12 15 PORT 16 (BASKET) ×1 IMPLANT
BAG RETRIEVAL 12/15 (BASKET) ×2
CHLORAPREP W/TINT 26ML (MISCELLANEOUS) ×2 IMPLANT
CLIP SUT LAPRA TY ABSORB (SUTURE) ×2 IMPLANT
CLIP VESOCCLUDE MED LG 6/CT (CLIP) IMPLANT
CLIP VESOLOCK LG 6/CT PURPLE (CLIP) ×6 IMPLANT
CLIP VESOLOCK MED LG 6/CT (CLIP) ×6 IMPLANT
CLIP VESOLOCK XL 6/CT (CLIP) IMPLANT
COVER SURGICAL LIGHT HANDLE (MISCELLANEOUS) ×2 IMPLANT
COVER TIP SHEARS 8 DVNC (MISCELLANEOUS) ×1 IMPLANT
COVER TIP SHEARS 8MM DA VINCI (MISCELLANEOUS) ×1
CUTTER ECHEON FLEX ENDO 45 340 (ENDOMECHANICALS) ×2 IMPLANT
DECANTER SPIKE VIAL GLASS SM (MISCELLANEOUS) ×2 IMPLANT
DERMABOND ADVANCED (GAUZE/BANDAGES/DRESSINGS) ×1
DERMABOND ADVANCED .7 DNX12 (GAUZE/BANDAGES/DRESSINGS) ×1 IMPLANT
DRAIN CHANNEL 15F RND FF 3/16 (WOUND CARE) IMPLANT
DRAPE ARM DVNC X/XI (DISPOSABLE) ×4 IMPLANT
DRAPE COLUMN DVNC XI (DISPOSABLE) ×1 IMPLANT
DRAPE DA VINCI XI ARM (DISPOSABLE) ×4
DRAPE DA VINCI XI COLUMN (DISPOSABLE) ×1
DRAPE INCISE IOBAN 66X45 STRL (DRAPES) ×2 IMPLANT
DRAPE SHEET LG 3/4 BI-LAMINATE (DRAPES) ×2 IMPLANT
ELECT PENCIL ROCKER SW 15FT (MISCELLANEOUS) ×2 IMPLANT
ELECT REM PT RETURN 15FT ADLT (MISCELLANEOUS) ×2 IMPLANT
EVACUATOR SILICONE 100CC (DRAIN) IMPLANT
GLOVE BIO SURGEON STRL SZ 6.5 (GLOVE) ×2 IMPLANT
GLOVE BIOGEL M STRL SZ7.5 (GLOVE) ×4 IMPLANT
GOWN STRL REUS W/TWL LRG LVL3 (GOWN DISPOSABLE) ×2 IMPLANT
GOWN STRL REUS W/TWL XL LVL3 (GOWN DISPOSABLE) ×4 IMPLANT
HEMOSTAT SURGICEL 4X8 (HEMOSTASIS) ×2 IMPLANT
IRRIG SUCT STRYKERFLOW 2 WTIP (MISCELLANEOUS) ×2
IRRIGATION SUCT STRKRFLW 2 WTP (MISCELLANEOUS) ×1 IMPLANT
KIT BASIN OR (CUSTOM PROCEDURE TRAY) ×2 IMPLANT
MARKER SKIN DUAL TIP RULER LAB (MISCELLANEOUS) ×2 IMPLANT
NEEDLE INSUFFLATION 120MM (ENDOMECHANICALS) ×2 IMPLANT
NEEDLE INSUFFLATION 14GA 120MM (NEEDLE) ×2 IMPLANT
PORT ACCESS TROCAR AIRSEAL 12 (TROCAR) ×1 IMPLANT
PORT ACCESS TROCAR AIRSEAL 5M (TROCAR) ×1
POSITIONER SURGICAL ARM (MISCELLANEOUS) ×4 IMPLANT
POUCH SPECIMEN RETRIEVAL 10MM (ENDOMECHANICALS) ×2 IMPLANT
RELOAD STAPLER WHITE 60MM (STAPLE) IMPLANT
SEAL CANN UNIV 5-8 DVNC XI (MISCELLANEOUS) ×3 IMPLANT
SEAL XI 5MM-8MM UNIVERSAL (MISCELLANEOUS) ×3
SET TRI-LUMEN FLTR TB AIRSEAL (TUBING) ×2 IMPLANT
SOLUTION ELECTROLUBE (MISCELLANEOUS) ×2 IMPLANT
STAPLE ECHEON FLEX 60 POW ENDO (STAPLE) IMPLANT
STAPLE RELOAD 45 WHT (STAPLE) ×3 IMPLANT
STAPLE RELOAD 45MM WHITE (STAPLE) ×3
STAPLER RELOAD WHITE 60MM (STAPLE)
STAPLER VISISTAT 35W (STAPLE) ×2 IMPLANT
SURGIFLO W/THROMBIN 8M KIT (HEMOSTASIS) IMPLANT
SUT ETHILON 2 0 PSLX (SUTURE) IMPLANT
SUT MNCRL AB 4-0 PS2 18 (SUTURE) ×4 IMPLANT
SUT PDS AB 0 CT1 36 (SUTURE) ×4 IMPLANT
SUT V-LOC BARB 180 2/0GR9 GS23 (SUTURE)
SUT VIC AB 1 CT1 27 (SUTURE) ×1
SUT VIC AB 1 CT1 27XBRD ANTBC (SUTURE) ×1 IMPLANT
SUT VICRYL 0 UR6 27IN ABS (SUTURE) ×4 IMPLANT
SUT VLOC BARB 180 ABS3/0GR12 (SUTURE) ×2
SUTURE V-LC BRB 180 2/0GR9GS23 (SUTURE) IMPLANT
SUTURE VLOC BRB 180 ABS3/0GR12 (SUTURE) ×1 IMPLANT
TIP RIGID 35CM EVICEL (HEMOSTASIS) ×2 IMPLANT
TOWEL OR 17X26 10 PK STRL BLUE (TOWEL DISPOSABLE) ×2 IMPLANT
TOWEL OR NON WOVEN STRL DISP B (DISPOSABLE) ×2 IMPLANT
TRAY FOLEY MTR SLVR 16FR STAT (SET/KITS/TRAYS/PACK) ×2 IMPLANT
TRAY LAPAROSCOPIC (CUSTOM PROCEDURE TRAY) ×2 IMPLANT
TROCAR BLADELESS OPT 5 100 (ENDOMECHANICALS) IMPLANT
WATER STERILE IRR 1000ML POUR (IV SOLUTION) ×2 IMPLANT

## 2018-04-24 NOTE — Discharge Instructions (Signed)

## 2018-04-24 NOTE — Transfer of Care (Signed)
Immediate Anesthesia Transfer of Care Note  Patient: Chelsea Fernandez  Procedure(s) Performed: ROBOTIC ASSITED NEPHRECTOMY (Right )  Patient Location: PACU  Anesthesia Type:General  Level of Consciousness: awake, alert , oriented and patient cooperative  Airway & Oxygen Therapy: Patient Spontanous Breathing and Patient connected to face mask oxygen  Post-op Assessment: Report given to RN, Post -op Vital signs reviewed and stable and Patient moving all extremities  Post vital signs: Reviewed and stable  Last Vitals:  Vitals Value Taken Time  BP 114/65 04/24/2018  4:58 PM  Temp    Pulse 103 04/24/2018  5:00 PM  Resp 21 04/24/2018  5:00 PM  SpO2 100 % 04/24/2018  5:00 PM  Vitals shown include unvalidated device data.  Last Pain:  Vitals:   04/24/18 1057  TempSrc:   PainSc: 0-No pain      Patients Stated Pain Goal: 4 (72/25/75 0518)  Complications: No apparent anesthesia complications

## 2018-04-24 NOTE — Op Note (Signed)
Operative Note  Preoperative diagnosis:  1.  2.7 cm right perihilar mass 2.  History of kidney stones  Postoperative diagnosis: 1.  2.7 cm right perihilar mass 2.  History of kidney stones  Procedure(s): 1.  Robotic assisted right radical nephrectomy  Surgeon: Ellison Hughs, MD  Assistants:  1.  Debbrah Alar PA-C 2.  Case Sherral Hammers, MD (PGY-4) An assistant was required for this surgical procedure.  The duties of the assistant included but were not limited to suctioning, passing suture, camera manipulation, retraction.  This procedure would not be able to be performed without an Environmental consultant.   Anesthesia: General   Complications:  None  EBL:  100 mL  Specimens: 1. Right kidney  Drains/Catheters: 1.  Foley catheter  Intraoperative findings:   1. The right perihilar mass directly abutted the posterior right renal pelvis and multiple hilar veins, making partial nephrectomy with clear surgical margins and hemostatic reconstruction unlikely.  2. 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 female who was initially seen in consultation by Dr. Matilde Sprang for a 6 mm right mid-ureteral stone. The patient was found to have a questionable mass involving her right kidney on CT from 02/04/2018. She eventually had an MRI of the abdomen with and without contrast on 02/13/2018 that demonstrated a heterogenous 2.7 cm right renal mass with features concerning for malignancy. There were no other signs of extrarenal involvement on her MRI. She has no personal/family history of GU malignancies. Her right flank pain soon resolved and she subsequently passed her kidney stone.   She has been consented for the above procedures, voices understanding and wishes to proceed  Description of procedure:  After informed consent was signed, the patient was taken back to the operating room and properly anesthetized.  The patient was then placed in the left lateral  decubitus position with all pressure points padded.  The abdomen was then prepped and draped in the usual sterile fashion.  A time-out was then performed.    An 8 mm incision was then made lateral to the right rectus muscle at the level of the right 12th rib.  A Veress needle was then used to access the abdominal cavity.  A saline drop test showed no signs of obstruction and aspiration of the Veress needle revealed no blood or sucus.  The abdominal cavity was then insufflated to 15 mmHg.  An 8 mm robotic trocar was then atraumatically inserted into the abdominal cavity.  The robotic camera was then inserted through the port and inspection of the abdominal cavity revealed no evidence of adjacent organ or vessel injury. We then placed three additional 8 mm robotic ports, a 12 mm assistant port and a 5 mm subxiphoid port in such as fashion as to triangulate the right renal hilum.  The robot was then docked into postion.  Using a combination of blunt and cold scissors dissection, the hepatic attachments were released from the abdominal sidewall.  A locking grasper was then inserted through the 5 mm sub-xyphoid port and used to retract the posterior surface of the liver more cephalad.  The white line of Toldt along the ascending colon was then incised, allowing [place] to reflect the colon medially and expose the anterior surface of the right kidney.  The duodenum was then Kocherized medially, which abruptly led [place] to identification of the inferior vena cava.    Once the colon was adequately mobilized, we moved to the lower pole and identified the  gonadal vein and ureter.  The gonadal vein was then left running parallel to the vena cava and the right ureter was reflected anteriorly.  Using cautious cautery, the overlying perihilar attachments were then released.  This yielded visualization of a lower pole accessory artery and vein.  The additional perihilar attachments were then dissected free in a similar fashion,  identifying 2 additional accessory arteries as well as the main renal vein and a proximal branch.   The overlying drugs fascia was then incised on the anterior surface of the kidney and all peri-nephric attachments were carefully dissected using a combination of blunt dissection and electrocautery.  Her posterior, exophytic, perihilar masses identified and found to be immediately adjacent to the posterior aspect of the right renal pelvis as well as within 2 mm of the lower pole accessory vein.  Given the location of the mass, the close proximity of the renal pelvis and lower pole accessory vein as well as the lack renal capsular tissue to safely perform enterorrhaphy, the decision was made to proceed with right radical nephrectomy.  Dr. Alphonse Guild was called in intraoperatively prior to proceeding with right radical nephrectomy to assess the tumor as well as her MRI and agreed with the decision to not proceed with partial nephrectomy due to the very low likelihood of safely excising the tumor without a positive surgical margin as well as adequately repairing the renal defect following excision of the mass.      A 45 mm powered endovascular stapler was then used to ligate the all 3 right renal arteries, which achieved excellent hemostasis. The right renal veins were ligated in a similar fashion. The ureter was then ligated using the Humalog clips.  The remaining perinephric attachments were then incised using electrocautery. Reinspection of the right retroperitoneal space revealed excellent hemostasis. Once the right kidney was fully mobile, it was placed in an Endo Catch bag and left in the abdominal cavity, to be retrieved later.    A right lower quadrant Gibson incision was then made in the overlying oblique musculature was incised using electrocautery.  The specimen bag with the right kidney within it was removed from the abdominal cavity and sent off to pathology for permanent section.  The assistant port  was then closed using the Carter-Thompson suture passer with a 0-vicryl.  The Gibson incision was then closed using a running 0 Vicryl to reapproximate the internal oblique fascia.  A 0 PDS was then used to reapproximate the external oblique fascia.  A total of 30 mL of Exparel was then injected within all incisions.  All skin incision was then closed using a 4-0 Monocryl and dressed with dermabond.  She tolerated the procedure well and was transferred to the post-anesthesia unit in stable condition.   Plan:  Monitor the patient on the floor overnight with repeat labs in the morning.

## 2018-04-24 NOTE — Anesthesia Procedure Notes (Signed)
Procedure Name: Intubation Date/Time: 04/24/2018 1:13 PM Performed by: Victoriano Lain, CRNA Pre-anesthesia Checklist: Patient identified, Emergency Drugs available, Suction available, Patient being monitored and Timeout performed Patient Re-evaluated:Patient Re-evaluated prior to induction Oxygen Delivery Method: Circle system utilized Preoxygenation: Pre-oxygenation with 100% oxygen Induction Type: IV induction Ventilation: Mask ventilation without difficulty Laryngoscope Size: Mac and 3 Grade View: Grade I Tube type: Oral Tube size: 7.0 mm Number of attempts: 1 Airway Equipment and Method: Stylet Placement Confirmation: ETT inserted through vocal cords under direct vision,  positive ETCO2 and breath sounds checked- equal and bilateral Secured at: 21 cm Tube secured with: Tape Dental Injury: Teeth and Oropharynx as per pre-operative assessment

## 2018-04-24 NOTE — Anesthesia Postprocedure Evaluation (Signed)
Anesthesia Post Note  Patient: Chelsea Fernandez  Procedure(s) Performed: ROBOTIC ASSITED NEPHRECTOMY (Right )     Patient location during evaluation: PACU Anesthesia Type: General Level of consciousness: awake and alert Pain management: pain level controlled Vital Signs Assessment: post-procedure vital signs reviewed and stable Respiratory status: spontaneous breathing, nonlabored ventilation, respiratory function stable and patient connected to nasal cannula oxygen Cardiovascular status: blood pressure returned to baseline and stable Postop Assessment: no apparent nausea or vomiting Anesthetic complications: no    Last Vitals:  Vitals:   04/24/18 1809 04/24/18 2044  BP: 113/71 103/64  Pulse: 67 72  Resp: 16 16  Temp: 36.9 C 36.8 C  SpO2: 100% 100%    Last Pain:  Vitals:   04/24/18 2044  TempSrc: Oral  PainSc:                  Alejo Beamer

## 2018-04-24 NOTE — Anesthesia Preprocedure Evaluation (Signed)
Anesthesia Evaluation  Patient identified by MRN, date of birth, ID band Patient awake    Reviewed: Allergy & Precautions, H&P , NPO status , Patient's Chart, lab work & pertinent test results, reviewed documented beta blocker date and time   Airway Mallampati: II  TM Distance: >3 FB Neck ROM: full    Dental no notable dental hx.    Pulmonary neg pulmonary ROS,    Pulmonary exam normal breath sounds clear to auscultation       Cardiovascular Exercise Tolerance: Good negative cardio ROS   Rhythm:regular Rate:Normal     Neuro/Psych negative neurological ROS  negative psych ROS   GI/Hepatic negative GI ROS, Neg liver ROS,   Endo/Other  negative endocrine ROS  Renal/GU negative Renal ROS  negative genitourinary   Musculoskeletal   Abdominal   Peds  Hematology negative hematology ROS (+)   Anesthesia Other Findings   Reproductive/Obstetrics negative OB ROS                             Anesthesia Physical Anesthesia Plan  ASA: II  Anesthesia Plan: General   Post-op Pain Management:    Induction: Intravenous  PONV Risk Score and Plan: 3 and Treatment may vary due to age or medical condition and Dexamethasone  Airway Management Planned: Oral ETT and LMA  Additional Equipment:   Intra-op Plan:   Post-operative Plan: Extubation in OR  Informed Consent: I have reviewed the patients History and Physical, chart, labs and discussed the procedure including the risks, benefits and alternatives for the proposed anesthesia with the patient or authorized representative who has indicated his/her understanding and acceptance.   Dental Advisory Given  Plan Discussed with: CRNA, Anesthesiologist and Surgeon  Anesthesia Plan Comments: (  )        Anesthesia Quick Evaluation

## 2018-04-24 NOTE — Interval H&P Note (Signed)
History and Physical Interval Note:  04/24/2018 11:11 AM  Chelsea Fernandez  has presented today for surgery, with the diagnosis of RIGHT RENAL MASS  The various methods of treatment have been discussed with the patient and family. After consideration of risks, benefits and other options for treatment, the patient has consented to  Procedure(s): ROBOTIC ASSITED PARTIAL NEPHRECTOMY (Right) as a surgical intervention .  The patient's history has been reviewed, patient examined, no change in status, stable for surgery.  I have reviewed the patient's chart and labs.  Questions were answered to the patient's satisfaction.     Conception Oms Chelsea Fernandez

## 2018-04-25 ENCOUNTER — Encounter (HOSPITAL_COMMUNITY): Payer: Self-pay | Admitting: Urology

## 2018-04-25 LAB — BASIC METABOLIC PANEL
ANION GAP: 10 (ref 5–15)
BUN: 12 mg/dL (ref 6–20)
CALCIUM: 8.9 mg/dL (ref 8.9–10.3)
CO2: 26 mmol/L (ref 22–32)
Chloride: 102 mmol/L (ref 98–111)
Creatinine, Ser: 1.01 mg/dL — ABNORMAL HIGH (ref 0.44–1.00)
GLUCOSE: 169 mg/dL — AB (ref 70–99)
POTASSIUM: 4.5 mmol/L (ref 3.5–5.1)
SODIUM: 138 mmol/L (ref 135–145)

## 2018-04-25 LAB — HEMOGLOBIN AND HEMATOCRIT, BLOOD
HCT: 41.7 % (ref 36.0–46.0)
HEMOGLOBIN: 13.9 g/dL (ref 12.0–15.0)

## 2018-04-25 MED ORDER — VITAMINS A & D EX OINT
TOPICAL_OINTMENT | CUTANEOUS | Status: AC
  Administered 2018-04-25: 21:00:00
  Filled 2018-04-25: qty 5

## 2018-04-25 NOTE — Progress Notes (Signed)
Urology Progress Note   1 Day Post-Op  Subjective: NAEON. Pain well controlled. Ambulating. Tolerating CLD.  Objective: Vital signs in last 24 hours: Temp:  [97.5 F (36.4 C)-98.5 F (36.9 C)] 98.3 F (36.8 C) (06/27 0518) Pulse Rate:  [65-103] 77 (06/27 0518) Resp:  [13-21] 15 (06/27 0518) BP: (93-114)/(52-72) 96/54 (06/27 0518) SpO2:  [99 %-100 %] 100 % (06/27 0518) Weight:  [67.1 kg (148 lb)] 67.1 kg (148 lb) (06/26 1057)  Intake/Output from previous day: 06/26 0701 - 06/27 0700 In: 3272.5 [I.V.:3102.5; IV Piggyback:170] Out: 1280 [Urine:1130; Blood:150] Intake/Output this shift: No intake/output data recorded.  Physical Exam:  General: Alert and oriented CV: RRR Lungs: Clear Abdomen: Soft, appropriately tender. Incisions c/d/i GU: Foley in place draining clear yellow urine Ext: NT, No erythema  Lab Results: Recent Labs    04/22/18 1037 04/24/18 1712 04/25/18 0447  HGB 13.7 13.2 13.9  HCT 41.3 39.6 41.7   BMET Recent Labs    04/22/18 1037 04/25/18 0447  NA 143 138  K 4.2 4.5  CL 108 102  CO2 27 26  GLUCOSE 96 169*  BUN 12 12  CREATININE 0.59 1.01*  CALCIUM 9.1 8.9     Studies/Results: No results found.  Assessment/Plan:  27 y.o. female s/p right radical nephrectomy. Overall doing well post-op.   - Continue pain control with PO meds, IV available for breakthrough - Medlock, regular diet today - D/c Foley catheter, TOV in 6 hours. - OOB  Dispo: Floor. Possible discharge this afternoon.   LOS: 0 days   Latonda Larrivee Rob Bunting 04/25/2018, 7:44 AM

## 2018-04-26 MED ORDER — SIMETHICONE 80 MG PO CHEW
80.0000 mg | CHEWABLE_TABLET | Freq: Once | ORAL | Status: AC
  Administered 2018-04-26: 80 mg via ORAL
  Filled 2018-04-26: qty 1

## 2018-04-26 NOTE — Progress Notes (Signed)
Report received from D.Green,RN. No change in assessment, continue plan of care. Chelsea Fernandez

## 2018-04-26 NOTE — Progress Notes (Addendum)
Urology Progress Note   2 Days Post-Op  Subjective: NAEON. Pain well controlled. Ambulating. Tolerating fluids, hasn't attempted solids yet. Complains of gas pain/bloating this am.  Objective: Vital signs in last 24 hours: Temp:  [97.7 F (36.5 C)-98.6 F (37 C)] 97.7 F (36.5 C) (06/28 0612) Pulse Rate:  [57-82] 57 (06/28 0612) Resp:  [16-20] 16 (06/28 0612) BP: (94-112)/(43-75) 109/71 (06/28 0612) SpO2:  [96 %-100 %] 100 % (06/28 0612)  Intake/Output from previous day: 06/27 0701 - 06/28 0700 In: -  Out: 900 [Urine:900] Intake/Output this shift: No intake/output data recorded.  Physical Exam:  General: Alert and oriented CV: RRR Lungs: Clear Abdomen: Soft, appropriately tender. Incisions c/d/i GU: Foley in place draining clear yellow urine Ext: NT, No erythema  Lab Results: Recent Labs    04/24/18 1712 04/25/18 0447  HGB 13.2 13.9  HCT 39.6 41.7   BMET Recent Labs    04/25/18 0447  NA 138  K 4.5  CL 102  CO2 26  GLUCOSE 169*  BUN 12  CREATININE 1.01*  CALCIUM 8.9     Studies/Results: No results found.  Assessment/Plan:  27 y.o. female s/p right radical nephrectomy. Overall doing well post-op.   - Continue pain control with PO meds - Simethicone for gas pain/bloating - Continue OOB  Dispo: Floor. Anticipate discharge this afternoon.  Case Rob Bunting 04/26/2018, 7:30 AM

## 2018-04-26 NOTE — Progress Notes (Signed)
The patient is still c/o bloating and abdominal pain.  Not passing flatus.  Tolerating full liquids.  Denies N/V.  Voiding w/o difficutly.  Ambulating with assistance when I saw her this afternoon.  Will check on her later this afternoon.    Ellison Hughs, MD

## 2018-04-26 NOTE — Progress Notes (Signed)
Initial Nutrition Assessment  DOCUMENTATION CODES:   Not applicable  INTERVENTION:   Boost Breeze po TID, each supplement provides 250 kcal and 9 grams of protein  NUTRITION DIAGNOSIS:   Inadequate oral intake related to decreased appetite as evidenced by per patient/family report.  GOAL:   Patient will meet greater than or equal to 90% of their needs  MONITOR:   PO intake, Supplement acceptance, Weight trends, I & O's, Labs  REASON FOR ASSESSMENT:   Malnutrition Screening Tool    ASSESSMENT:   Patient with PMH significant for kidney stones and anxiety. Presents this admission with 6 mm right mid-ureteral stone and was found to have a questionable mass involving the right kidney.    6/26- Robotic assisted right radical nephrectomy, clear liquid diet 6/27- regular diet  Pt denies having loss in appetite PTA. States she eats three meals a day that consist of soup, vegetables, chicken, and other grains. She is not a big meat eater. Appetite s/p surgery has declined. She tolerated broth and italian ice this morning. Reports being sacred to eat due to increased gas pains. Discussed staring with bland food options like oatmeal, grits, rice, and mashed potatoes. Pt amendable. She is drinking her boost breeze TID and would like to continue with these.   Pt endorses a UBW of 150 lb and denies any recent wt loss. Records indicate pt weighed 141 lb 04/24/17 and 148 lb this admission. Nutrition-Focused physical exam completed.   Medications reviewed.  Labs reviewed.   NUTRITION - FOCUSED PHYSICAL EXAM:    Most Recent Value  Orbital Region  No depletion  Upper Arm Region  No depletion  Thoracic and Lumbar Region  Unable to assess  Buccal Region  No depletion  Temple Region  No depletion  Clavicle Bone Region  No depletion  Clavicle and Acromion Bone Region  No depletion  Scapular Bone Region  Unable to assess  Dorsal Hand  No depletion  Patellar Region  No depletion  Anterior  Thigh Region  No depletion  Posterior Calf Region  No depletion  Edema (RD Assessment)  None  Hair  Reviewed  Eyes  Reviewed  Mouth  Reviewed  Skin  Reviewed  Nails  Reviewed     Diet Order:   Diet Order           Diet regular Room service appropriate? Yes; Fluid consistency: Thin  Diet effective now          EDUCATION NEEDS:   Education needs have been addressed  Skin:  Skin Assessment: Skin Integrity Issues: Skin Integrity Issues:: Incisions Incisions: right flank  Last BM:  PTA  Height:   Ht Readings from Last 1 Encounters:  04/24/18 5\' 3"  (1.6 m)    Weight:   Wt Readings from Last 1 Encounters:  04/24/18 148 lb (67.1 kg)    Ideal Body Weight:  52.3 kg  BMI:  Body mass index is 26.22 kg/m.  Estimated Nutritional Needs:   Kcal:  1650-1850 kcal  Protein:  80-90 g  Fluid:  >1.6 L/day    Mariana Single RD, LDN Clinical Nutrition Pager # [phone]

## 2018-04-27 LAB — BASIC METABOLIC PANEL
Anion gap: 8 (ref 5–15)
BUN: 11 mg/dL (ref 6–20)
CALCIUM: 8.9 mg/dL (ref 8.9–10.3)
CO2: 30 mmol/L (ref 22–32)
CREATININE: 0.92 mg/dL (ref 0.44–1.00)
Chloride: 103 mmol/L (ref 98–111)
GFR calc non Af Amer: >60 mL/min (ref >60)
GLUCOSE: 96 mg/dL (ref 70–99)
Potassium: 3.5 mmol/L (ref 3.5–5.1)
SODIUM: 141 mmol/L (ref 135–145)

## 2018-04-27 LAB — CBC
HCT: 38 % (ref 36.0–46.0)
Hemoglobin: 12.5 g/dL (ref 12.0–15.0)
MCH: 27.9 pg (ref 26.0–34.0)
MCHC: 32.9 g/dL (ref 30.0–36.0)
MCV: 84.8 fL (ref 78.0–100.0)
PLATELETS: 261 10*3/uL (ref 150–400)
RBC: 4.48 MIL/uL (ref 3.87–5.11)
RDW: 13.6 % (ref 11.5–15.5)
WBC: 9.6 10*3/uL (ref 4.0–10.5)

## 2018-04-27 NOTE — Progress Notes (Signed)
Reviewed discharge information with patient and father. Answered all questions. Patient able to teach back medications and reasons to contact MD/911. Patient verbalizes importance of urology follow up appointment.  Barbee Shropshire. Brigitte Pulse, RN

## 2018-04-27 NOTE — Discharge Summary (Signed)
Patient ID: Chelsea Fernandez MRN: [id number] DOB/AGE: 12-05-1990 27 y.o.  Admit date: 04/24/2018 Discharge date: 04/27/2018  Primary Care Physician:  Patient, No Pcp Per  Discharge Diagnoses:  Renal cell carcinoma Present on Admission: . Renal mass   Consults:  None   Discharge Medications: Allergies as of 04/27/2018   No Known Allergies     Medication List    STOP taking these medications   ibuprofen 200 MG tablet Commonly known as:  ADVIL,MOTRIN   multivitamin with minerals Tabs tablet     TAKE these medications   docusate sodium 100 MG capsule Commonly known as:  COLACE Take 1 capsule (100 mg total) by mouth 2 (two) times daily.   ondansetron 4 MG tablet Commonly known as:  ZOFRAN Take 1 tablet (4 mg total) by mouth every 8 (eight) hours as needed for nausea or vomiting. What changed:    when to take this  reasons to take this   oxyCODONE-acetaminophen 5-325 MG tablet Commonly known as:  PERCOCET/ROXICET Take 1-2 tablets by mouth every 6 (six) hours as needed for moderate pain or severe pain. What changed:    how much to take  reasons to take this   tamsulosin 0.4 MG Caps capsule Commonly known as:  FLOMAX Take 1 capsule (0.4 mg total) by mouth daily.        Significant Diagnostic Studies:  No results found.  Brief H and P: For complete details please refer to admission H and P, but in brief pt admitted for rt nephrectomy for rt renal mass  Hospital Course: Slow progression of bowel fxn postop but pt d/ced on POD 3 on clear liquid diet w/ return of bowel fxn Active Problems:   Renal mass   Day of Discharge BP 98/65 (BP Location: Right Arm)   Pulse (!) 57   Temp 98.6 F (37 C) (Oral)   Resp 16   Ht 5\' 3"  (1.6 m)   Wt 67.1 kg (148 lb)   LMP 04/17/2018 (Exact Date)   SpO2 97%   BMI 26.22 kg/m   Results for orders placed or performed during the hospital encounter of 04/24/18 (from the past 24 hour(s))  Basic metabolic panel     Status:  None   Collection Time: 04/27/18  3:48 AM  Result Value Ref Range   Sodium 141 135 - 145 mmol/L   Potassium 3.5 3.5 - 5.1 mmol/L   Chloride 103 98 - 111 mmol/L   CO2 30 22 - 32 mmol/L   Glucose, Bld 96 70 - 99 mg/dL   BUN 11 6 - 20 mg/dL   Creatinine, Ser 0.92 0.44 - 1.00 mg/dL   Calcium 8.9 8.9 - 10.3 mg/dL   GFR calc non Af Amer >60 >60 mL/min   GFR calc Af Amer >60 >60 mL/min   Anion gap 8 5 - 15  CBC     Status: None   Collection Time: 04/27/18  3:48 AM  Result Value Ref Range   WBC 9.6 4.0 - 10.5 K/uL   RBC 4.48 3.87 - 5.11 MIL/uL   Hemoglobin 12.5 12.0 - 15.0 g/dL   HCT 38.0 36.0 - 46.0 %   MCV 84.8 78.0 - 100.0 fL   MCH 27.9 26.0 - 34.0 pg   MCHC 32.9 30.0 - 36.0 g/dL   RDW 13.6 11.5 - 15.5 %   Platelets 261 150 - 400 K/uL    Physical Exam: General: Alert and awake oriented x3 not in any acute distress. HEENT: anicteric  sclera, pupils reactive to light and accommodation CVS: S1-S2 clear no murmur rubs or gallops Chest: clear to auscultation bilaterally, no wheezing rales or rhonchi Abdomen: soft nontender, nondistended, normal bowel sounds, no organomegaly Extremities: no cyanosis, clubbing or edema noted bilaterally Neuro: Cranial nerves II-XII intact, no focal neurological deficits  Disposition:  Home  Diet:  Advance to regular  Activity:  Gradually increase   Disposition and Follow-up:    followup scheduled  TESTS THAT NEED FOLLOW-UP  Path reviewed w/ pt  DISCHARGE FOLLOW-UP Follow-up Information    Ceasar Mons, MD On 05/03/2018.   Specialty:  Urology Why:  at 8:15 Contact information: [address] [place] [address] [phone]           Time spent on Discharge:  10 mins  Signed: Lillette Boxer Kinlie Janice 04/27/2018, 8:30 AM

## 2018-07-30 ENCOUNTER — Other Ambulatory Visit (HOSPITAL_COMMUNITY): Payer: Self-pay | Admitting: Urology

## 2018-07-30 DIAGNOSIS — C641 Malignant neoplasm of right kidney, except renal pelvis: Secondary | ICD-10-CM

## 2018-08-06 ENCOUNTER — Ambulatory Visit (HOSPITAL_COMMUNITY)
Admission: RE | Admit: 2018-08-06 | Discharge: 2018-08-06 | Disposition: A | Discharge status: Home / Self Care | Payer: Self-pay | Source: Ambulatory Visit | Attending: Urology | Admitting: Urology

## 2018-08-06 DIAGNOSIS — C641 Malignant neoplasm of right kidney, except renal pelvis: Secondary | ICD-10-CM

## 2018-08-06 MED ORDER — SODIUM CHLORIDE 0.9 % IJ SOLN
INTRAMUSCULAR | Status: AC
  Filled 2018-08-06: qty 50

## 2018-08-06 MED ORDER — IOHEXOL 300 MG/ML  SOLN
100.0000 mL | Freq: Once | INTRAMUSCULAR | Status: AC | PRN
  Administered 2018-08-06: 100 mL via INTRAVENOUS

## 2018-08-29 IMAGING — US US RENAL
1 series · 14 of 25 positions shown · non-contrast
Comparison: CT 02/04/2018

CLINICAL DATA: Possible right renal mass

EXAM:
RENAL / URINARY TRACT ULTRASOUND COMPLETE

[Series 1: us renal · 0.22mm/px · 14 of 60 slices shown]
[im 1/60]
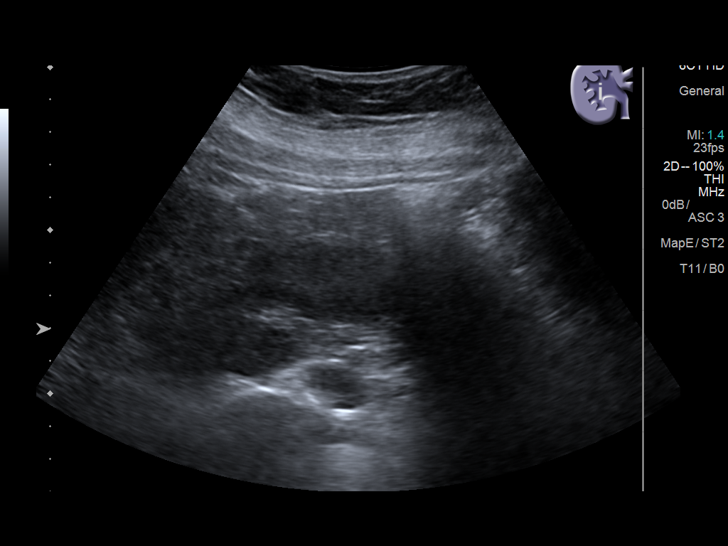
[im 5/60]
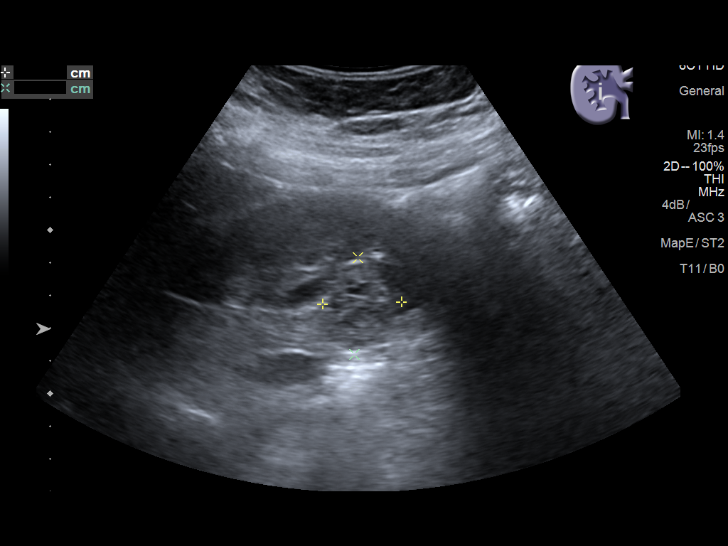
[im 10/60]
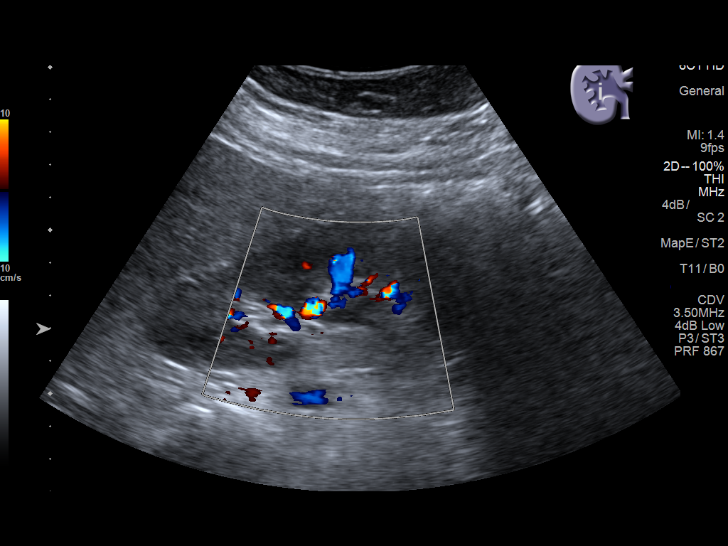
[im 15/60]
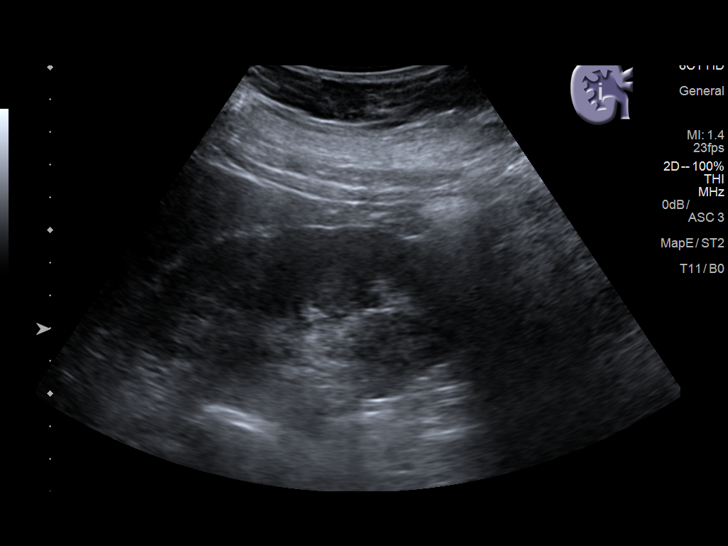
[im 20/60]
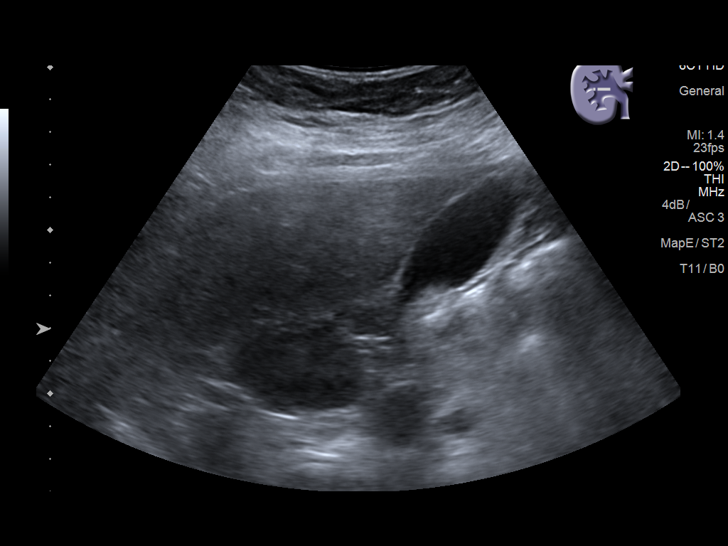
[im 23/60]
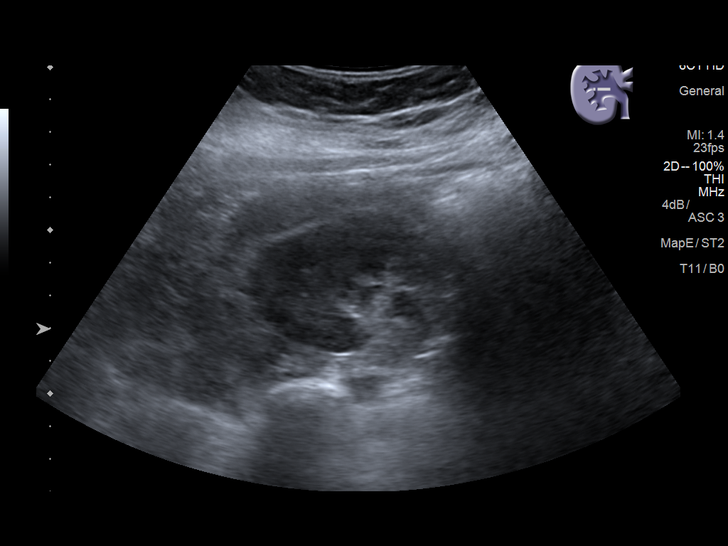
[im 28/60]
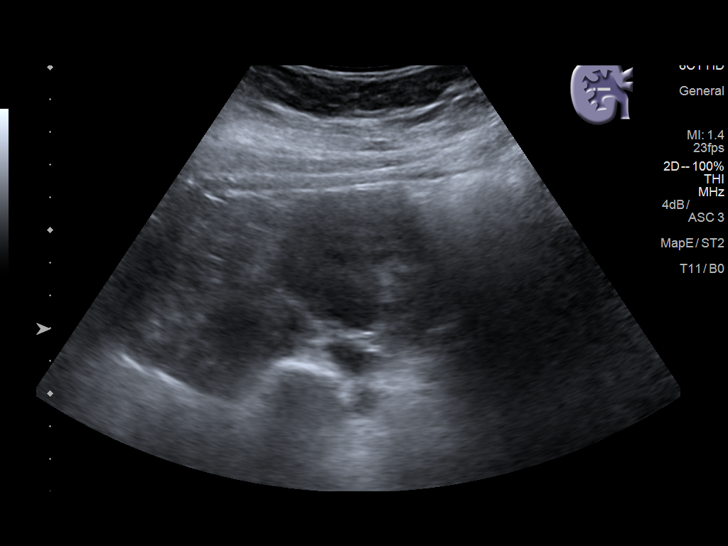
[im 32/60]
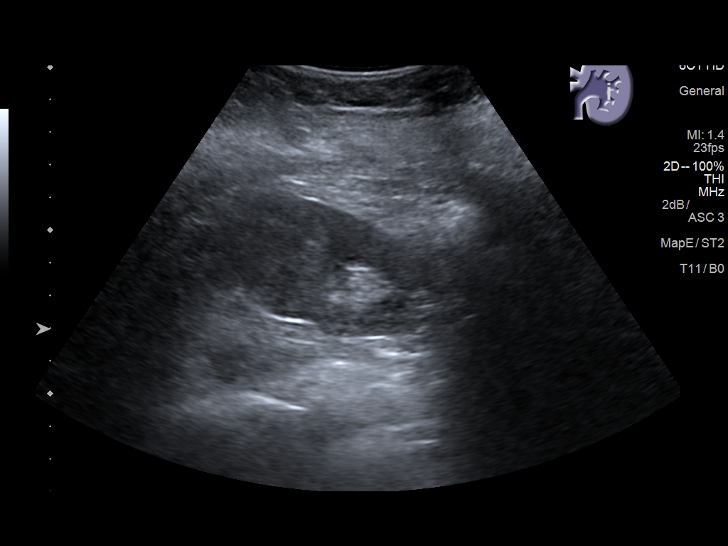
[im 37/60]
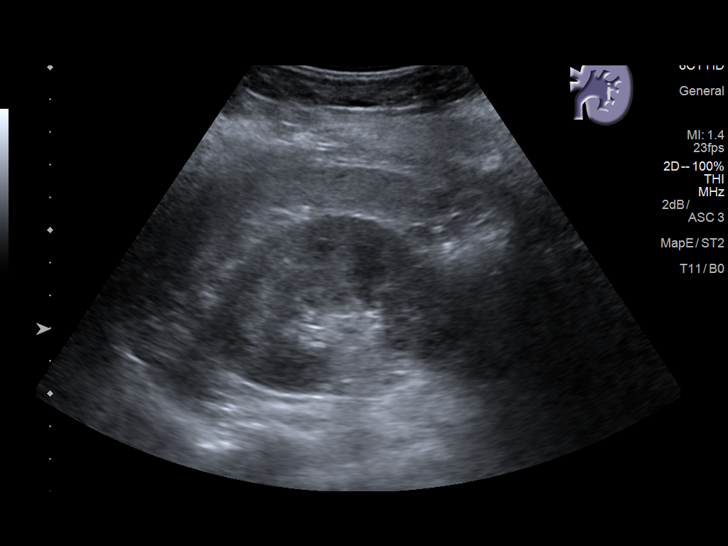
[im 40/60]
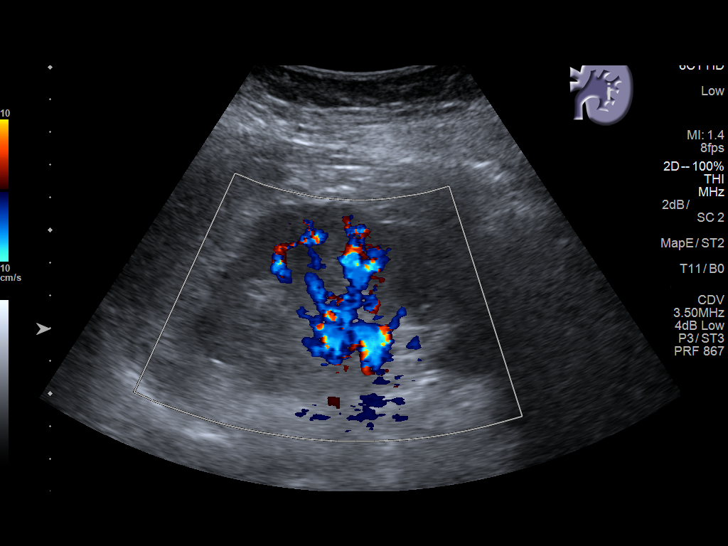
[im 45/60]
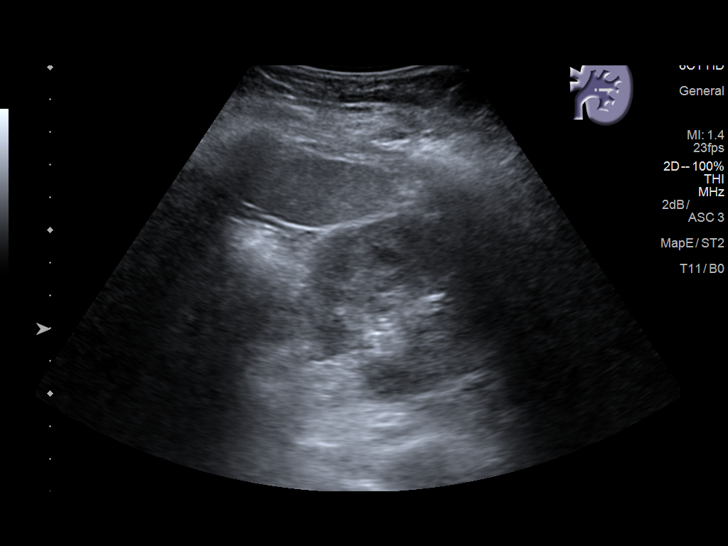
[im 50/60]
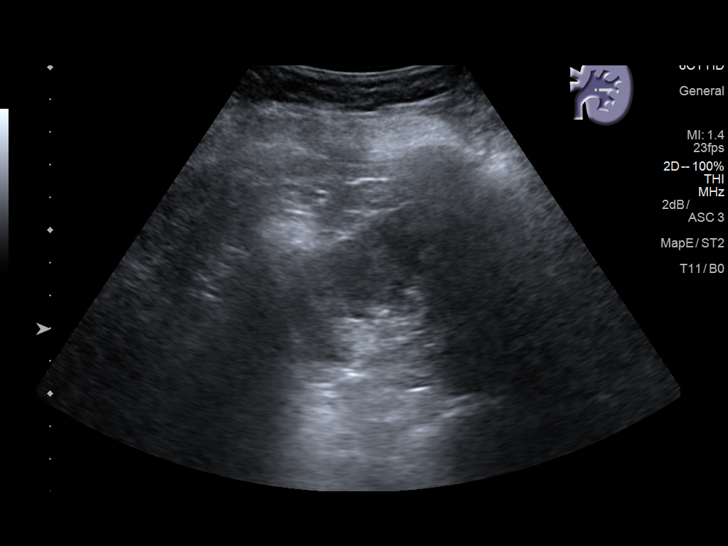
[im 55/60]
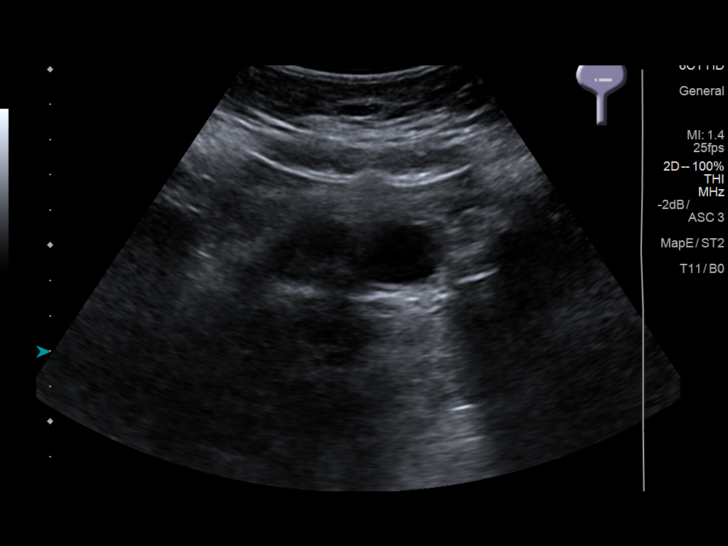
[im 60/60]
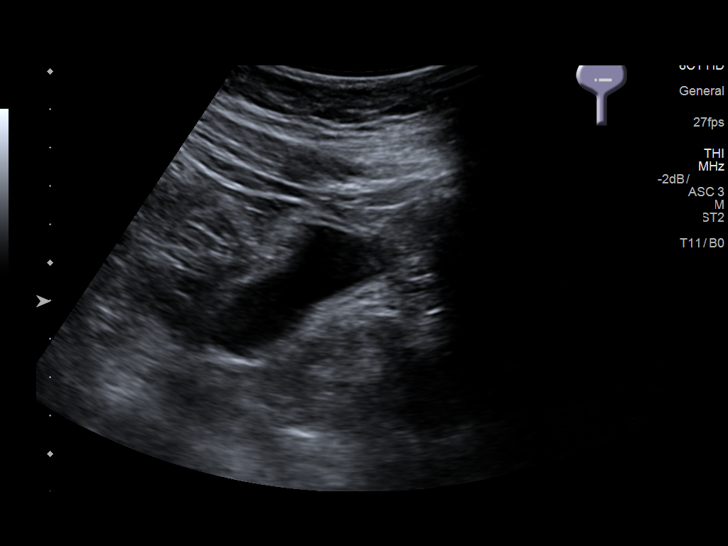

[14 of 25 positions shown; findings below may reference images not displayed]

FINDINGS: Right Kidney:

Length: 10.8 cm. Mild right hydronephrosis. Solid-appearing slightly
echogenic mass within the mid to lower pole right kidney measuring
2.4 x 3 x 2 cm.

Left Kidney:

Length: 10.8 cm. Echogenicity within normal limits. No mass or
hydronephrosis visualized.

Bladder:

Appears normal for degree of bladder distention.
IMPRESSION: 1. Mild right hydronephrosis
2. 3 cm solid-appearing slightly echogenic indeterminate mass within
the mid to lower pole of the right kidney. Further evaluation with
dedicated contrast-enhanced renal CT and/or MRI is recommended.

## 2018-09-19 ENCOUNTER — Encounter: Payer: Self-pay | Admitting: Gynecology

## 2018-09-19 ENCOUNTER — Ambulatory Visit (INDEPENDENT_AMBULATORY_CARE_PROVIDER_SITE_OTHER): Payer: Self-pay | Admitting: Gynecology

## 2018-09-19 VITALS — BP 116/74 | Ht 63.0 in | Wt 148.0 lb

## 2018-09-19 DIAGNOSIS — Z01419 Encounter for gynecological examination (general) (routine) without abnormal findings: Secondary | ICD-10-CM

## 2018-09-19 DIAGNOSIS — N898 Other specified noninflammatory disorders of vagina: Secondary | ICD-10-CM

## 2018-09-19 DIAGNOSIS — Z1151 Encounter for screening for human papillomavirus (HPV): Secondary | ICD-10-CM

## 2018-09-19 MED ORDER — FLUCONAZOLE 150 MG PO TABS: 150.0000 mg | ORAL_TABLET | Freq: Once | ORAL | 0 refills | Status: AC

## 2018-09-19 NOTE — Addendum Note (Signed)
Addended by: Nelva Nay on: 09/19/2018 12:40 PM   Modules accepted: Orders

## 2018-09-19 NOTE — Progress Notes (Signed)
    Chelsea Fernandez 1991-06-14 [id number]        27 y.o.  G1P0010 for annual gynecologic exam.  Former patient of Dr. Toney Rakes.  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   Follow-up colposcopy 03/2017 showed inflammatory changes on biopsy but no dysplasia.  Repeat Pap smear at that time showed LGSIL with positive high risk HPV, positive subtype 16.  Presents now for annual exam and Pap smear.  Patient also notes intermittent vaginal discharge that she has been dealing with over the past year to 2 years.  Has been evaluated by Dr. Toney Rakes and treated with a variety of treatments to include treatments for BV.  She is not having symptoms now but will notice symptoms after gym workout or around her menses.  Past medical history,surgical history, problem list, medications, allergies, family history and social history were all reviewed and documented as reviewed in the EPIC chart.  ROS:  Performed with pertinent positives and negatives included in the history, assessment and plan.   Additional significant findings : None   Exam: Caryn Bee assistant Vitals:   09/19/18 1035  BP: 116/74  Weight: 148 lb (67.1 kg)  Height: 5\' 3"  (1.6 m)   Body mass index is 26.22 kg/m.  General appearance:  Normal affect, orientation and appearance. Skin: Grossly normal HEENT: Without gross lesions.  No cervical or supraclavicular adenopathy. Thyroid normal.  Lungs:  Clear without wheezing, rales or rhonchi Cardiac: RR, without RMG Abdominal:  Soft, nontender, without masses, guarding, rebound, organomegaly or hernia Breasts:  Examined lying and sitting without masses, retractions, discharge or axillary adenopathy. Pelvic:  Ext, BUS, Vagina: Normal  Cervix: Normal.  Pap smear/HPV  Uterus: Anteverted, normal size, shape and contour, midline and mobile nontender   Adnexa: Without masses or tenderness    Anus and  perineum: Normal    Assessment/Plan:  27 y.o. G52P0010 female for annual gynecologic exam regular menses, not sexually active.   1. Contraception.  Patient not sexually active.  Need to address contraception before she becomes sexually active reviewed. 2. STD screening performed previously.  Not indicated at this point as she is not sexually active. 3. Breast health.  Breast exam normal today. 4. Intermittent vaginal discharge.  Exam today without significant discharge.  Discussed various possibilities to include low-grade recurrent yeast which historically sounds like the most likely reason.  Diflucan 150 mg #5 prescribed to take 1 p.o. as needed with symptoms.  If this resolves her symptoms then she will treat as needed.  We will follow-up if persist despite this. 5. 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  Most recent colposcopy 03/2017 without dysplasia.  Pap smear/HPV today.  Patient will follow-up for results. 6. Health maintenance.  Recently had nephrectomy for incidental finding of renal carcinoma when evaluated for renal lithiasis.  CBC and CMP were done this past year.  No blood work ordered today.  Follow-up for Pap smear results otherwise follow-up in 1 year for annual exam.   Anastasio Auerbach MD, 11:06 AM 09/19/2018

## 2018-09-19 NOTE — Patient Instructions (Signed)
Follow-up for Pap smear results. 

## 2018-09-23 LAB — PAP IG AND HPV HIGH-RISK: HPV DNA HIGH RISK: NOT DETECTED

## 2018-10-01 ENCOUNTER — Telehealth: Payer: Self-pay | Admitting: *Deleted

## 2018-10-01 NOTE — Telephone Encounter (Signed)
Patient called and left message requesting results from [facility] on 09/19/18. I called back and left detailed message on cell that only result was pap and it was normal.

## 2019-04-05 ENCOUNTER — Emergency Department (HOSPITAL_COMMUNITY)
Admission: EM | Admit: 2019-04-05 | Discharge: 2019-04-05 | Disposition: A | Discharge status: Home / Self Care | Payer: Self-pay | Attending: Emergency Medicine | Admitting: Emergency Medicine

## 2019-04-05 ENCOUNTER — Emergency Department (HOSPITAL_COMMUNITY): Payer: Self-pay

## 2019-04-05 ENCOUNTER — Other Ambulatory Visit: Payer: Self-pay

## 2019-04-05 DIAGNOSIS — Z85528 Personal history of other malignant neoplasm of kidney: Secondary | ICD-10-CM | POA: Insufficient documentation

## 2019-04-05 DIAGNOSIS — Z20828 Contact with and (suspected) exposure to other viral communicable diseases: Secondary | ICD-10-CM | POA: Insufficient documentation

## 2019-04-05 DIAGNOSIS — R0789 Other chest pain: Secondary | ICD-10-CM | POA: Insufficient documentation

## 2019-04-05 DIAGNOSIS — R0602 Shortness of breath: Secondary | ICD-10-CM | POA: Insufficient documentation

## 2019-04-05 LAB — CBC WITH DIFFERENTIAL/PLATELET
Abs Immature Granulocytes: 0.06 10*3/uL (ref 0.00–0.07)
Basophils Absolute: 0 10*3/uL (ref 0.0–0.1)
Basophils Relative: 0 %
Eosinophils Absolute: 0.1 10*3/uL (ref 0.0–0.5)
Eosinophils Relative: 0 %
HCT: 41.4 % (ref 36.0–46.0)
Hemoglobin: 13.7 g/dL (ref 12.0–15.0)
Immature Granulocytes: 0 %
Lymphocytes Relative: 19 %
Lymphs Abs: 2.7 10*3/uL (ref 0.7–4.0)
MCH: 27.3 pg (ref 26.0–34.0)
MCHC: 33.1 g/dL (ref 30.0–36.0)
MCV: 82.6 fL (ref 80.0–100.0)
Monocytes Absolute: 0.9 10*3/uL (ref 0.1–1.0)
Monocytes Relative: 7 %
Neutro Abs: 10.2 10*3/uL — ABNORMAL HIGH (ref 1.7–7.7)
Neutrophils Relative %: 74 %
Platelets: 295 10*3/uL (ref 150–400)
RBC: 5.01 MIL/uL (ref 3.87–5.11)
RDW: 13.4 % (ref 11.5–15.5)
WBC: 14 10*3/uL — ABNORMAL HIGH (ref 4.0–10.5)
nRBC: 0 % (ref 0.0–0.2)

## 2019-04-05 LAB — BASIC METABOLIC PANEL
Anion gap: 11 (ref 5–15)
BUN: 21 mg/dL — ABNORMAL HIGH (ref 6–20)
CO2: 24 mmol/L (ref 22–32)
Calcium: 9.3 mg/dL (ref 8.9–10.3)
Chloride: 104 mmol/L (ref 98–111)
Creatinine, Ser: 0.84 mg/dL (ref 0.44–1.00)
GFR calc Af Amer: >60 mL/min (ref >60)
GFR calc non Af Amer: >60 mL/min (ref >60)
Glucose, Bld: 97 mg/dL (ref 70–99)
Potassium: 3.2 mmol/L — ABNORMAL LOW (ref 3.5–5.1)
Sodium: 139 mmol/L (ref 135–145)

## 2019-04-05 LAB — SARS CORONAVIRUS 2 BY RT PCR (HOSPITAL ORDER, PERFORMED IN ~~LOC~~ HOSPITAL LAB): SARS Coronavirus 2: NEGATIVE

## 2019-04-05 MED ORDER — POTASSIUM CHLORIDE CRYS ER 20 MEQ PO TBCR
40.0000 meq | EXTENDED_RELEASE_TABLET | Freq: Once | ORAL | Status: AC
  Administered 2019-04-05: 40 meq via ORAL
  Filled 2019-04-05: qty 2

## 2019-04-05 MED ORDER — HYDROXYZINE HCL 25 MG PO TABS
25.0000 mg | ORAL_TABLET | Freq: Once | ORAL | Status: AC
  Administered 2019-04-05: 25 mg via ORAL
  Filled 2019-04-05: qty 1

## 2019-04-05 MED ORDER — OMEPRAZOLE 20 MG PO CPDR: 20.0000 mg | DELAYED_RELEASE_CAPSULE | Freq: Two times a day (BID) | ORAL | 0 refills | Status: DC

## 2019-04-05 NOTE — ED Triage Notes (Signed)
Pt POV d/t central chest pain that w/ L.arm numbness that started afer receiving 30mg  of Prednisone for difficulty breathing

## 2019-04-05 NOTE — ED Notes (Signed)
Patient verbalizes understanding of discharge instructions. Opportunity for questioning and answers were provided. Armband removed by staff, pt discharged from ED ambulatory.   

## 2019-04-05 NOTE — ED Provider Notes (Signed)
[facility] EMERGENCY DEPARTMENT Provider Note   CSN: [id number] Arrival date & time: 04/05/19  1521    History   Chief Complaint Chief Complaint  Patient presents with  . Chest Pain  . Shortness of Breath    HPI Chelsea Fernandez is a 28 y.o. female.     The history is provided by the patient and medical records. No language interpreter was used.  Chest Pain  Associated symptoms: shortness of breath   Shortness of Breath  Associated symptoms: chest pain      28 year old female with history of anxiety, kidney cancer, presenting complaining of chest discomfort.  Patient report she has had sinus congestion ongoing for the past 2 to 3 months.  She thought she may have the flu.  She was seen by several different providers at different for treatment.  She recalls receiving antibiotic a month ago for symptoms with minimal relief.  She was also seen by an ear nose and throat doctor several days ago.  She was told that her sinus passages swollen and she received a shot of steroids 2 days ago.  Since then, patient states she has been feeling very shaky, tingling sensation to both of her hands and around her mouth, feeling very nervous.  Symptom is improving but not fully resolved.  She feels as if she cannot catch her breath.  States she has been taking Flonase as prescribed for the past few months.  She is also reports concerning for potential COVID-19.  She denies any recent sick contact.  No recent travel.  No prior history of PE or DVT.  Patient also noticed red spot on her left upper chest earlier today which has not gone away.  It is not itchy or painful.  Past Medical History:  Diagnosis Date  . Anxiety   . ASCUS of cervix with negative high risk HPV   . Cancer [facility])    Kidney cancer  . CIN II (cervical intraepithelial neoplasia II)   . Family history of adverse reaction to anesthesia    aunt has high tolerance to anesthesia  will not put her tosleep  . Headache     migraine rarely  . History of kidney stones     Patient Active Problem List   Diagnosis Date Noted  . Renal mass 04/24/2018  . 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 ASCUS with positive high risk HPV cervical 10/04/2016  . Hyperlipidemia 09/05/2016  . Fluctuation of weight 09/04/2016    Past Surgical History:  Procedure Laterality Date  . DILATION AND CURETTAGE OF UTERUS  2012  . NOSE SURGERY    . ROBOTIC ASSITED PARTIAL NEPHRECTOMY Right 04/24/2018   Procedure: ROBOTIC ASSITED NEPHRECTOMY;  Surgeon: Ceasar Mons, MD;  Location: [facility] OR[facility];  Service: Urology;  Laterality: Right;     OB History    Gravida  1   Para  0   Term  0   Preterm  0   AB  1   Living        SAB  1   TAB  0   Ectopic  0   Multiple      Live Births               Home Medications    Prior to Admission medications   Not on File    Family History Family History  Adopted: Yes  Problem Relation Age of Onset  . Hypertension Maternal Aunt   .  Hypertension Maternal Uncle   . Cancer Maternal Grandmother         STOMACH     Social History Social History   Tobacco Use  . Smoking status: Never Smoker  . Smokeless tobacco: Never Used  Substance Use Topics  . Alcohol use: No  . Drug use: No     Allergies   Patient has no known allergies.   Review of Systems Review of Systems  Respiratory: Positive for shortness of breath.   Cardiovascular: Positive for chest pain.  All other systems reviewed and are negative.    Physical Exam Updated Vital Signs BP 116/73   Pulse 73   Resp 12   Ht 5\' 3"  (1.6 m)   Wt 67.1 kg   SpO2 100%   BMI 26.22 kg/m   Physical Exam Vitals signs and nursing note reviewed.  Constitutional:      General: She is not in acute distress.    Appearance: She is well-developed.  HENT:     Head: Atraumatic.  Eyes:     Conjunctiva/sclera: Conjunctivae normal.  Neck:     Musculoskeletal: Neck supple.  Cardiovascular:      Rate and Rhythm: Normal rate and regular rhythm.  Pulmonary:     Breath sounds: Normal breath sounds. No wheezing, rhonchi or rales.  Abdominal:     Palpations: Abdomen is soft.     Tenderness: There is no abdominal tenderness.  Musculoskeletal: Normal range of motion.  Skin:    Findings: No rash.     Comments: 2 circular blanchable erythema macular lesion noted to left upper chest measuring approximately a quarter in size without any signs of infection.  Neurological:     Mental Status: She is alert and oriented to person, place, and time.      ED Treatments / Results  Labs (all labs ordered are listed, but only abnormal results are displayed) Labs Reviewed  BASIC METABOLIC PANEL - Abnormal; Notable for the following components:      Result Value   Potassium 3.2 (*)    BUN 21 (*)    All other components within normal limits  CBC WITH DIFFERENTIAL/PLATELET - Abnormal; Notable for the following components:   WBC 14.0 (*)    Neutro Abs 10.2 (*)    All other components within normal limits  SARS CORONAVIRUS 2 (HOSPITAL ORDER, [facility])    EKG EKG Interpretation  Date/Time:  Saturday April 05 2019 15:33:48 EDT Ventricular Rate:  72 PR Interval:    QRS Duration: 90 QT Interval:  378 QTC Calculation: 414 R Axis:   68 Text Interpretation:  Sinus rhythm Confirmed by Davonna Belling [phone] on 04/05/2019 3:43:33 PM   Radiology Dg Chest Port 1 View  Result Date: 04/05/2019 CLINICAL DATA:  Shortness of breath EXAM: PORTABLE CHEST 1 VIEW COMPARISON:  None. FINDINGS: Lungs are clear. Heart size and pulmonary vascularity are normal. No adenopathy. No bone lesions. IMPRESSION: No edema or consolidation. Electronically Signed   By: Lowella Grip III M.D.   On: 04/05/2019 16:52    Procedures Procedures (including critical care time)  Medications Ordered in ED Medications  potassium chloride SA (K-DUR) CR tablet 40 mEq (has no administration in  time range)  hydrOXYzine (ATARAX/VISTARIL) tablet 25 mg (25 mg Oral Given 04/05/19 1701)     Initial Impression / Assessment and Plan / ED Course  I have reviewed the triage vital signs and the nursing notes.  Pertinent labs & imaging results that  were available during my care of the patient were reviewed by me and considered in my medical decision making (see chart for details).        BP 116/73   Pulse 73   Resp 12   Ht 5\' 3"  (1.6 m)   Wt 67.1 kg   SpO2 100%   BMI 26.22 kg/m    Final Clinical Impressions(s) / ED Diagnoses   Final diagnoses:  Atypical chest pain    ED Discharge Orders         Ordered    omeprazole (PRILOSEC) 20 MG capsule  2 times daily before meals     04/05/19 1919         3:50 PM Patient here complaining of concerns for COVID-19.  She also report having tremors, tingling sensation to her extremities after receiving steroid shot several days ago.  I suspect this is likely a side effect of the steroid.  I have low suspicion for stroke or ACS or other acute emergent medical condition.  Perform screening labs.  5:04 PM Elevated white count of 14 likely secondary to recent prednisone use.  Chest x-ray unremarkable.  EKG is normal.  7:18 PM COVID test is negative.  Patient felt reassured.  At this time she is stable for discharge.  Recommend follow-up with outpatient PCP as needed.  Return precaution discussed.   Domenic Moras, PA-C 04/05/19 Toni Amend, MD 04/15/19 1459

## 2019-05-23 ENCOUNTER — Other Ambulatory Visit: Payer: Self-pay | Admitting: Urology

## 2019-05-23 DIAGNOSIS — C641 Malignant neoplasm of right kidney, except renal pelvis: Secondary | ICD-10-CM

## 2019-07-21 ENCOUNTER — Encounter: Payer: Self-pay | Admitting: Gynecology

## 2019-08-05 ENCOUNTER — Ambulatory Visit (HOSPITAL_COMMUNITY)
Admission: RE | Admit: 2019-08-05 | Discharge: 2019-08-05 | Disposition: A | Discharge status: Home / Self Care | Payer: Self-pay | Source: Ambulatory Visit | Attending: Urology | Admitting: Urology

## 2019-08-05 ENCOUNTER — Encounter (HOSPITAL_COMMUNITY): Payer: Self-pay

## 2019-08-05 ENCOUNTER — Other Ambulatory Visit: Payer: Self-pay

## 2019-08-05 DIAGNOSIS — C641 Malignant neoplasm of right kidney, except renal pelvis: Secondary | ICD-10-CM | POA: Insufficient documentation

## 2019-08-05 LAB — POCT I-STAT CREATININE: Creatinine, Ser: 0.8 mg/dL (ref 0.44–1.00)

## 2019-08-05 MED ORDER — SODIUM CHLORIDE (PF) 0.9 % IJ SOLN
INTRAMUSCULAR | Status: AC
  Filled 2019-08-05: qty 50

## 2019-08-05 MED ORDER — IOHEXOL 300 MG/ML  SOLN
100.0000 mL | Freq: Once | INTRAMUSCULAR | Status: AC | PRN
  Administered 2019-08-05: 100 mL via INTRAVENOUS

## 2019-12-16 ENCOUNTER — Ambulatory Visit (INDEPENDENT_AMBULATORY_CARE_PROVIDER_SITE_OTHER): Payer: Self-pay | Admitting: Obstetrics and Gynecology

## 2019-12-16 ENCOUNTER — Encounter: Payer: Self-pay | Admitting: Obstetrics and Gynecology

## 2019-12-16 ENCOUNTER — Other Ambulatory Visit: Payer: Self-pay

## 2019-12-16 VITALS — BP 120/70 | Ht 60.0 in | Wt 151.0 lb

## 2019-12-16 DIAGNOSIS — Z01419 Encounter for gynecological examination (general) (routine) without abnormal findings: Secondary | ICD-10-CM

## 2019-12-16 DIAGNOSIS — Z13220 Encounter for screening for lipoid disorders: Secondary | ICD-10-CM

## 2019-12-16 DIAGNOSIS — Z124 Encounter for screening for malignant neoplasm of cervix: Secondary | ICD-10-CM

## 2019-12-16 NOTE — Patient Instructions (Signed)
We will let you know the results from your lab work and Pap smear Keep an eye on your back pain symptoms, if noticing worsening pain you may need a kidney ultrasound to look for kidney stones

## 2019-12-16 NOTE — Progress Notes (Signed)
   Chelsea Fernandez 1991-01-19 [id number]  SUBJECTIVE:  29 y.o. G1P0010 female for annual routine gynecologic exam and Pap smear. She has no gynecologic concerns.  Not currently sexually active.  Not currently on contraception.  Current Outpatient Medications  Medication Sig Dispense Refill  . omeprazole (PRILOSEC) 20 MG capsule Take 1 capsule (20 mg total) by mouth 2 (two) times daily before a meal. 30 capsule 0   No current facility-administered medications for this visit.   Allergies: Patient has no known allergies.  No LMP recorded.  Past medical history,surgical history, problem list, medications, allergies, family history and social history were all reviewed and documented as reviewed in the EPIC chart.  ROS:  Feeling well. No dyspnea or chest pain on exertion.  No abdominal pain, change in bowel habits, black or bloody stools.  No urinary tract symptoms. GYN ROS: normal menses, no abnormal bleeding, pelvic pain or discharge, no breast pain or new or enlarging lumps on self exam. No neurological complaints.    OBJECTIVE:  There were no vitals taken for this visit. The patient appears well, alert, oriented x 3, in no distress. ENT normal.  Neck supple. No cervical or supraclavicular adenopathy or thyromegaly.  Lungs are clear, good air entry, no wheezes, rhonchi or rales. S1 and S2 normal, no murmurs, regular rate and rhythm.  Abdomen soft without tenderness, guarding, mass or organomegaly.  Neurological is normal, no focal findings.  BREAST EXAM: breasts appear normal, no suspicious masses, no skin or nipple changes or axillary nodes  PELVIC EXAM: VULVA: normal appearing vulva with no masses, tenderness or lesions, VAGINA: normal appearing vagina with normal color and discharge, no lesions, CERVIX: normal appearing cervix without discharge or lesions, UTERUS: uterus is normal size, shape, consistency and nontender, ADNEXA: normal adnexa in size, nontender and no masses,  PAP: Pap  smear done today, thin-prep method  Chaperone: Caryn Bee present during the examination  ASSESSMENT:  29 y.o. G1P0010 here for annual gynecologic exam  PLAN:   1. No menstrual or hormonal concerns. STD screening previously done, not repeated since she is not sexually active currently.  2. Pap smear collected today.  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  Last Pap smear was 08/2018 and indicated normal cytology with negative HPV.   3. Contraception: None currently. Should resume if becomes sexually active and not desiring conception. 4. Normal breast exam.  Encouraged breast self awareness and notify us of any concerning changes. 5. Health maintenance.  Routine screening blood work (lipids, CBC, CMP) is offered and and she would like to proceed. Says she is fasting today.  History of nephrectomy for renal cell carcinoma and also recently had passed a kidney stone. Intermittently has pains in left back but no dysuria. Notify us or PCP if having those symptoms.  Return annually or sooner, prn.  Joseph Pierini MD  12/16/19

## 2019-12-16 NOTE — Addendum Note (Signed)
Addended by: Nelva Nay on: 12/16/2019 09:40 AM   Modules accepted: Orders

## 2019-12-17 LAB — COMPREHENSIVE METABOLIC PANEL
AG Ratio: 1.5 (calc) (ref 1.0–2.5)
ALT: 14 U/L (ref 6–29)
AST: 14 U/L (ref 10–30)
Albumin: 4.4 g/dL (ref 3.6–5.1)
Alkaline phosphatase (APISO): 76 U/L (ref 31–125)
BUN: 12 mg/dL (ref 7–25)
CO2: 25 mmol/L (ref 20–32)
Calcium: 9.5 mg/dL (ref 8.6–10.2)
Chloride: 103 mmol/L (ref 98–110)
Creat: 0.83 mg/dL (ref 0.50–1.10)
Globulin: 3 g/dL (calc) (ref 1.9–3.7)
Glucose, Bld: 84 mg/dL (ref 65–99)
Potassium: 4 mmol/L (ref 3.5–5.3)
Sodium: 139 mmol/L (ref 135–146)
Total Bilirubin: 0.5 mg/dL (ref 0.2–1.2)
Total Protein: 7.4 g/dL (ref 6.1–8.1)

## 2019-12-17 LAB — CBC
HCT: 42.2 % (ref 35.0–45.0)
Hemoglobin: 14 g/dL (ref 11.7–15.5)
MCH: 27.2 pg (ref 27.0–33.0)
MCHC: 33.2 g/dL (ref 32.0–36.0)
MCV: 82.1 fL (ref 80.0–100.0)
MPV: 10.6 fL (ref 7.5–12.5)
Platelets: 297 10*3/uL (ref 140–400)
RBC: 5.14 10*6/uL — ABNORMAL HIGH (ref 3.80–5.10)
RDW: 12.6 % (ref 11.0–15.0)
WBC: 8.2 10*3/uL (ref 3.8–10.8)

## 2019-12-17 LAB — LIPID PANEL
Cholesterol: 265 mg/dL — ABNORMAL HIGH (ref <200)
HDL: 59 mg/dL (ref >=50)
LDL Cholesterol (Calc): 175 mg/dL (calc) — ABNORMAL HIGH
Non-HDL Cholesterol (Calc): 206 mg/dL (calc) — ABNORMAL HIGH (ref <130)
Total CHOL/HDL Ratio: 4.5 (calc) (ref <5.0)
Triglycerides: 160 mg/dL — ABNORMAL HIGH (ref <150)

## 2019-12-18 LAB — HUMAN PAPILLOMAVIRUS, HIGH RISK: HPV DNA High Risk: NOT DETECTED

## 2019-12-18 LAB — PAP IG W/ RFLX HPV ASCU

## 2019-12-19 NOTE — Progress Notes (Signed)
Please let Chelsea Fernandez know her Pap smear was an intermediate category, but with her negative HPV test, there is nothing to worry about at this time and I would recommend repeating the Pap smear and HPV test in 3 years. Blood counts, blood sugar level, and electrolyte levels are normal. Her cholesterol level is elevated as it was 3 years ago, and the "bad" cholesterol level LDL was elevated as were the triglycerides. If she was indeed fasting on the day we collected the labs, this indicates that these levels are high and in that case I would recommend that she make an appointment with a family doctor or internist to discuss these results.

## 2020-01-13 ENCOUNTER — Encounter: Payer: Self-pay | Admitting: Obstetrics and Gynecology

## 2020-03-02 ENCOUNTER — Encounter: Payer: Self-pay | Admitting: Adult Health Nurse Practitioner

## 2020-03-02 ENCOUNTER — Other Ambulatory Visit: Payer: Self-pay

## 2020-03-02 ENCOUNTER — Ambulatory Visit (INDEPENDENT_AMBULATORY_CARE_PROVIDER_SITE_OTHER): Payer: Self-pay | Admitting: Adult Health Nurse Practitioner

## 2020-03-02 VITALS — BP 132/85 | HR 70 | Temp 98.7°F | Ht 63.0 in | Wt 152.3 lb

## 2020-03-02 DIAGNOSIS — J302 Other seasonal allergic rhinitis: Secondary | ICD-10-CM

## 2020-03-02 MED ORDER — OMEPRAZOLE 20 MG PO CPDR: 20.0000 mg | DELAYED_RELEASE_CAPSULE | Freq: Every day | ORAL | 3 refills | Status: DC

## 2020-03-02 MED ORDER — FLUTICASONE PROPIONATE 50 MCG/ACT NA SUSP: 2.0000 | Freq: Every day | NASAL | 6 refills | Status: DC

## 2020-03-02 MED ORDER — ALBUTEROL SULFATE HFA 108 (90 BASE) MCG/ACT IN AERS: 2.0000 | INHALATION_SPRAY | Freq: Four times a day (QID) | RESPIRATORY_TRACT | 3 refills | Status: DC | PRN

## 2020-03-02 NOTE — Patient Instructions (Addendum)
If you have lab work done today you will be contacted with your lab results within the next 2 weeks.  If you have not heard from [place] then please contact us. The fastest way to get your results is to register for My Chart.   IF you received an x-ray today, you will receive an invoice from [facility]. Please contact [facility] at [phone] with questions or concerns regarding your invoice.   IF you received labwork today, you will receive an invoice from [place]. Please contact [facility] at [phone] with questions or concerns regarding your invoice.   Our billing staff will not be able to assist you with questions regarding bills from these companies.  You will be contacted with the lab results as soon as they are available. The fastest way to get your results is to activate your My Chart account. Instructions are located on the last page of this paperwork. If you have not heard from [place] regarding the results in 2 weeks, please contact this office.     Esophagitis  Esophagitis is inflammation of the esophagus. The esophagus is the tube that carries food from your mouth to your stomach. Esophagitis can cause soreness or pain in the esophagus. This condition can make it difficult and painful to swallow. What are the causes? Most causes of esophagitis are not serious. Common causes of this condition include:  Gastroesophageal reflux disease (GERD). This is when stomach contents move back up into the esophagus (reflux).  Repeated vomiting.  An allergic reaction, especially caused by food allergies (eosinophilic esophagitis).  Injury to the esophagus by swallowing large pills with or without water, or swallowing certain types of medicines.  Swallowing (ingesting) harmful chemicals, such as household cleaning products.  Heavy alcohol use.  An infection of the esophagus. This most often occurs in people who have a weakened immune system.  Radiation or  chemotherapy treatment for cancer.  Certain diseases such as sarcoidosis, Crohn's disease, and scleroderma. What are the signs or symptoms? Symptoms of this condition include:  Difficult or painful swallowing.  Pain with swallowing acidic liquids, such as citrus juices.  Pain with burping.  Chest pain.  Difficulty breathing.  Nausea.  Vomiting.  Pain in the abdomen.  Weight loss.  Ulcers in the mouth.  Patches of white material in the mouth (candidiasis).  Fever.  Coughing up blood or vomiting blood.  Stool that is black, tarry, or bright red. How is this diagnosed? Your health care provider will take a medical history and perform a physical exam. You may also have other tests, including:  An endoscopy to examine your esophagus and stomach with a small flexible tube with a camera.  A test that measures the acidity level in your esophagus.  A test that measures how much pressure is on your esophagus.  A barium swallow or modified barium swallow to show the shape, size, and functioning of your esophagus.  Allergy tests. How is this treated? Treatment for this condition depends on the cause of your esophagitis. In some cases, steroids or other medicines may be given to help relieve your symptoms or to treat the underlying cause of your condition. You may have to make some lifestyle changes, such as:  Avoiding alcohol.  Quitting smoking.  Changing your diet.  Exercising.  Changing your sleep habits and your sleep environment. Follow these instructions at home: Medicines  Take over-the-counter and prescription medicines only as told by your health care provider.  Do not take  aspirin, ibuprofen, or other NSAIDs unless your health care provider told you to do so.  If you have trouble taking pills: ? Use a pill splitter to decrease the size of the pill. This will decrease the chance of the pill getting stuck or injuring your esophagus. ? Drink water after you  take a pill. Eating and drinking   Avoid foods and drinks that seem to make your symptoms worse.  Follow a diet as recommended by your health care provider. This may involve avoiding foods and drinks such as: ? Coffee and tea (with or without caffeine). ? Drinks that contain alcohol. ? Energy drinks and sports drinks. ? Carbonated drinks or sodas. ? Chocolate and cocoa. ? Peppermint and mint flavorings. ? Garlic and onions. ? Horseradish. ? Spicy and acidic foods, including peppers, chili powder, curry powder, vinegar, hot sauces, and barbecue sauce. ? Citrus fruit juices and citrus fruits, such as oranges, lemons, and limes. ? Tomato-based foods, such as red sauce, chili, salsa, and pizza with red sauce. ? Fried and fatty foods, such as donuts, french fries, potato chips, and high-fat dressings. ? High-fat meats, such as hot dogs and fatty cuts of red and white meats, such as rib eye steak, sausage, ham, and bacon. ? High-fat dairy items, such as whole milk, butter, and cream cheese. Lifestyle  Eat small, frequent meals instead of large meals.  Avoid drinking large amounts of liquid with your meals.  Avoid eating meals during the 2-3 hours before bedtime.  Avoid lying down right after you eat.  Do not exercise right after you eat.  Do not use any products that contain nicotine or tobacco, such as cigarettes and e-cigarettes. If you need help quitting, ask your health care provider. General instructions   Pay attention to any changes in your symptoms. Let your health care provider know about them.  Wear loose-fitting clothing. Do not wear anything tight around your waist that causes pressure on your abdomen.  Raise (elevate) the head of your bed about 6 inches (15 cm).  Try relaxation strategies such as yoga, deep breathing, or meditation to manage stress. If you need help reducing stress, ask your health care provider.  If you are overweight, reduce your weight to an  amount that is healthy for you. Ask your health care provider for guidance about a safe weight loss goal.  Keep all follow-up visits as told by your health care provider. This is important. Contact a health care provider if:  You have new symptoms.  You have unexplained weight loss.  You have difficulty swallowing, or it hurts to swallow.  You have wheezing or a cough that does not go away.  Your symptoms do not improve with treatment.  You have frequent heartburn for more than two weeks. Get help right away if:  You have severe pain in your arms, neck, jaw, teeth, or back.  You feel sweaty, dizzy, or light-headed.  You have chest pain or shortness of breath.  You vomit and your vomit looks like blood or coffee grounds.  Your stool is bloody or black.  You have a fever.  You cannot swallow, drink, or eat. Summary  Esophagitis is inflammation of the esophagus.  Most causes of esophagitis are not serious.  Follow your health care provider's instructions about eating and drinking. Follow instructions on medicines.  Contact a health care provider if you have new symptoms, have weight loss, or coughing that does not stop.  Get help right away if you  have severe pain in the arms, neck, jaw, teeth or back, or if you have chest pain, shortness of breath, or fever. This information is not intended to replace advice given to you by your health care provider. Make sure you discuss any questions you have with your health care provider. Document Revised: 06/07/2018 Document Reviewed: 06/07/2018 Elsevier Patient Education  [place].

## 2020-03-07 ENCOUNTER — Encounter (HOSPITAL_COMMUNITY): Payer: Self-pay | Admitting: Emergency Medicine

## 2020-03-07 ENCOUNTER — Emergency Department (HOSPITAL_COMMUNITY): Payer: Self-pay

## 2020-03-07 ENCOUNTER — Other Ambulatory Visit: Payer: Self-pay

## 2020-03-07 ENCOUNTER — Emergency Department (HOSPITAL_COMMUNITY)
Admission: EM | Admit: 2020-03-07 | Discharge: 2020-03-07 | Disposition: A | Discharge status: Home / Self Care | Payer: Self-pay | Attending: Emergency Medicine | Admitting: Emergency Medicine

## 2020-03-07 DIAGNOSIS — Z79899 Other long term (current) drug therapy: Secondary | ICD-10-CM | POA: Insufficient documentation

## 2020-03-07 DIAGNOSIS — K2900 Acute gastritis without bleeding: Secondary | ICD-10-CM | POA: Insufficient documentation

## 2020-03-07 HISTORY — DX: Gastro-esophageal reflux disease without esophagitis: K21.9

## 2020-03-07 LAB — HEPATIC FUNCTION PANEL
ALT: 18 U/L (ref 0–44)
AST: 18 U/L (ref 15–41)
Albumin: 4.2 g/dL (ref 3.5–5.0)
Alkaline Phosphatase: 76 U/L (ref 38–126)
Bilirubin, Direct: <0.1 mg/dL (ref 0.0–0.2)
Total Bilirubin: 0.8 mg/dL (ref 0.3–1.2)
Total Protein: 7.9 g/dL (ref 6.5–8.1)

## 2020-03-07 LAB — CBC WITH DIFFERENTIAL/PLATELET
Abs Immature Granulocytes: 0.05 10*3/uL (ref 0.00–0.07)
Basophils Absolute: 0.1 10*3/uL (ref 0.0–0.1)
Basophils Relative: 1 %
Eosinophils Absolute: 0.1 10*3/uL (ref 0.0–0.5)
Eosinophils Relative: 1 %
HCT: 46.3 % — ABNORMAL HIGH (ref 36.0–46.0)
Hemoglobin: 14.8 g/dL (ref 12.0–15.0)
Immature Granulocytes: 1 %
Lymphocytes Relative: 20 %
Lymphs Abs: 2.2 10*3/uL (ref 0.7–4.0)
MCH: 27.2 pg (ref 26.0–34.0)
MCHC: 32 g/dL (ref 30.0–36.0)
MCV: 85 fL (ref 80.0–100.0)
Monocytes Absolute: 0.6 10*3/uL (ref 0.1–1.0)
Monocytes Relative: 6 %
Neutro Abs: 7.6 10*3/uL (ref 1.7–7.7)
Neutrophils Relative %: 71 %
Platelets: 314 10*3/uL (ref 150–400)
RBC: 5.45 MIL/uL — ABNORMAL HIGH (ref 3.87–5.11)
RDW: 13.4 % (ref 11.5–15.5)
WBC: 10.6 10*3/uL — ABNORMAL HIGH (ref 4.0–10.5)
nRBC: 0 % (ref 0.0–0.2)

## 2020-03-07 LAB — URINALYSIS, ROUTINE W REFLEX MICROSCOPIC
Bacteria, UA: NONE SEEN
Bilirubin Urine: NEGATIVE
Glucose, UA: NEGATIVE mg/dL
Hgb urine dipstick: NEGATIVE
Ketones, ur: NEGATIVE mg/dL
Nitrite: NEGATIVE
Protein, ur: NEGATIVE mg/dL
Specific Gravity, Urine: 1.021 (ref 1.005–1.030)
pH: 6 (ref 5.0–8.0)

## 2020-03-07 LAB — BASIC METABOLIC PANEL
Anion gap: 14 (ref 5–15)
BUN: 9 mg/dL (ref 6–20)
CO2: 21 mmol/L — ABNORMAL LOW (ref 22–32)
Calcium: 9.3 mg/dL (ref 8.9–10.3)
Chloride: 104 mmol/L (ref 98–111)
Creatinine, Ser: 0.8 mg/dL (ref 0.44–1.00)
GFR calc Af Amer: >60 mL/min (ref >60)
GFR calc non Af Amer: >60 mL/min (ref >60)
Glucose, Bld: 100 mg/dL — ABNORMAL HIGH (ref 70–99)
Potassium: 3.5 mmol/L (ref 3.5–5.1)
Sodium: 139 mmol/L (ref 135–145)

## 2020-03-07 LAB — LIPASE, BLOOD: Lipase: 26 U/L (ref 11–51)

## 2020-03-07 LAB — I-STAT BETA HCG BLOOD, ED (MC, WL, AP ONLY): I-stat hCG, quantitative: <5 m[IU]/mL (ref <5)

## 2020-03-07 MED ORDER — PANTOPRAZOLE SODIUM 40 MG PO TBEC
40.0000 mg | DELAYED_RELEASE_TABLET | Freq: Once | ORAL | Status: AC
  Administered 2020-03-07: 40 mg via ORAL
  Filled 2020-03-07: qty 1

## 2020-03-07 MED ORDER — OMEPRAZOLE 20 MG PO CPDR
20.0000 mg | DELAYED_RELEASE_CAPSULE | Freq: Every day | ORAL | 0 refills | Status: DC
Start: 2020-03-07 — End: 2020-04-05

## 2020-03-07 MED ORDER — LIDOCAINE VISCOUS HCL 2 % MT SOLN
15.0000 mL | Freq: Once | OROMUCOSAL | Status: AC
  Administered 2020-03-07: 15 mL via ORAL
  Filled 2020-03-07: qty 15

## 2020-03-07 MED ORDER — PANTOPRAZOLE SODIUM 40 MG IV SOLR: 40.0000 mg | Freq: Once | INTRAVENOUS | Status: DC

## 2020-03-07 MED ORDER — SUCRALFATE 1 G PO TABS
1.0000 g | ORAL_TABLET | Freq: Three times a day (TID) | ORAL | 0 refills | Status: DC
Start: 2020-03-07 — End: 2020-04-05

## 2020-03-07 MED ORDER — ALUM & MAG HYDROXIDE-SIMETH 200-200-20 MG/5ML PO SUSP
30.0000 mL | Freq: Once | ORAL | Status: AC
  Administered 2020-03-07: 30 mL via ORAL
  Filled 2020-03-07: qty 30

## 2020-03-07 NOTE — ED Provider Notes (Signed)
[facility] EMERGENCY DEPARTMENT Provider Note   CSN: [id number] Arrival date & time: 03/07/20  1252     History Chief Complaint  Patient presents with  . Flank Pain    Chelsea Fernandez is a 29 y.o. female.  Patient is a 29 year old female who presents with left flank pain.  She reports of several day history of increased GERD symptoms.  Today she had some increase symptoms with feel like she has a ball in her epigastrium.  She also has some sharp pains in her left flank.  She has had some nausea but no vomiting.  She reports her symptoms are worse after eating.  She does have a history of prior nephrolithiasis but has not passed a kidney stone.  She had a prior history of right renal cancer with subsequent removal of the tumor.  She denies any known fevers.  No urinary symptoms.  No change in bowel habits.        Past Medical History:  Diagnosis Date  . Acid reflux   . Anxiety   . ASCUS of cervix with negative high risk HPV   . CIN II (cervical intraepithelial neoplasia II)   . Family history of adverse reaction to anesthesia    aunt has high tolerance to anesthesia  will not put her tosleep  . Headache    migraine rarely  . History of kidney stones   . right renal ca dx'd 01/2018   Kidney cancer    Patient Active Problem List   Diagnosis Date Noted  . Renal mass 04/24/2018  . 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 ASCUS with positive high risk HPV cervical 10/04/2016  . Hyperlipidemia 09/05/2016  . Fluctuation of weight 09/04/2016    Past Surgical History:  Procedure Laterality Date  . DILATION AND CURETTAGE OF UTERUS  2012  . NOSE SURGERY    . ROBOTIC ASSITED PARTIAL NEPHRECTOMY Right 04/24/2018   Procedure: ROBOTIC ASSITED NEPHRECTOMY;  Surgeon: Ceasar Mons, MD;  Location: [facility];  Service: Urology;  Laterality: Right;     OB History    Gravida  1   Para  0   Term  0   Preterm  0   AB  1   Living        SAB  1   TAB  0   Ectopic  0   Multiple      Live Births              Family History  Adopted: Yes  Problem Relation Age of Onset  . Hypertension Maternal Aunt   . Hypertension Maternal Uncle   . Cancer Maternal Grandmother         STOMACH     Social History   Tobacco Use  . Smoking status: Never Smoker  . Smokeless tobacco: Never Used  Substance Use Topics  . Alcohol use: No  . Drug use: No    Home Medications Prior to Admission medications   Medication Sig Start Date End Date Taking? Authorizing Provider  albuterol (VENTOLIN HFA) 108 (90 Base) MCG/ACT inhaler Inhale 2 puffs into the lungs every 6 (six) hours as needed for wheezing or shortness of breath. 03/02/20   Wendall Mola, NP  fluticasone Asencion Islam) 50 MCG/ACT nasal spray Place 2 sprays into both nostrils daily. 03/02/20   Wendall Mola, NP  omeprazole (PRILOSEC) 20 MG capsule Take 1 capsule (20 mg total) by mouth daily. 03/07/20   Naethan Bracewell,  Threasa Beards, MD  sucralfate (CARAFATE) 1 g tablet Take 1 tablet (1 g total) by mouth 4 (four) times daily -  with meals and at bedtime. 03/07/20   Malvin Johns, MD    Allergies    Patient has no known allergies.  Review of Systems   Review of Systems  Constitutional: Negative for chills, diaphoresis, fatigue and fever.  HENT: Negative for congestion, rhinorrhea and sneezing.   Eyes: Negative.   Respiratory: Negative for cough, chest tightness and shortness of breath.   Cardiovascular: Negative for chest pain and leg swelling.  Gastrointestinal: Positive for abdominal pain and nausea. Negative for blood in stool, diarrhea and vomiting.  Genitourinary: Negative for difficulty urinating, flank pain, frequency and hematuria.  Musculoskeletal: Negative for arthralgias and back pain.  Skin: Negative for rash.  Neurological: Negative for dizziness, speech difficulty, weakness, numbness and headaches.    Physical Exam Updated Vital Signs BP 105/66   Pulse 64   Temp  97.9 F (36.6 C) (Oral)   Resp 18   Ht 5\' 3"  (1.6 m)   Wt 69.4 kg   LMP 02/28/2020   SpO2 96%   BMI 27.10 kg/m   Physical Exam Constitutional:      Appearance: She is well-developed.  HENT:     Head: Normocephalic and atraumatic.  Eyes:     Pupils: Pupils are equal, round, and reactive to light.  Cardiovascular:     Rate and Rhythm: Normal rate and regular rhythm.     Heart sounds: Normal heart sounds.  Pulmonary:     Effort: Pulmonary effort is normal. No respiratory distress.     Breath sounds: Normal breath sounds. No wheezing or rales.  Chest:     Chest wall: No tenderness.  Abdominal:     General: Bowel sounds are normal.     Palpations: Abdomen is soft.     Tenderness: There is abdominal tenderness (Tenderness to the epigastrium and left upper quadrant). There is no guarding or rebound.  Musculoskeletal:        General: Normal range of motion.     Cervical back: Normal range of motion and neck supple.  Lymphadenopathy:     Cervical: No cervical adenopathy.  Skin:    General: Skin is warm and dry.     Findings: No rash.  Neurological:     Mental Status: She is alert and oriented to person, place, and time.     ED Results / Procedures / Treatments   Labs (all labs ordered are listed, but only abnormal results are displayed) Labs Reviewed  URINALYSIS, ROUTINE W REFLEX MICROSCOPIC - Abnormal; Notable for the following components:      Result Value   APPearance HAZY (*)    Leukocytes,Ua SMALL (*)    All other components within normal limits  BASIC METABOLIC PANEL - Abnormal; Notable for the following components:   CO2 21 (*)    Glucose, Bld 100 (*)    All other components within normal limits  CBC WITH DIFFERENTIAL/PLATELET - Abnormal; Notable for the following components:   WBC 10.6 (*)    RBC 5.45 (*)    HCT 46.3 (*)    All other components within normal limits  HEPATIC FUNCTION PANEL  LIPASE, BLOOD  I-STAT BETA HCG BLOOD, ED ([facility], [facility], [facility] ONLY)     EKG None  Radiology CT Renal Stone Study  Result Date: 03/07/2020 CLINICAL DATA:  Left flank pain and nausea for 1-2 weeks. EXAM: CT ABDOMEN AND PELVIS WITHOUT CONTRAST  TECHNIQUE: Multidetector CT imaging of the abdomen and pelvis was performed following the standard protocol without IV contrast. COMPARISON:  08/05/2019 FINDINGS: Lower chest: Linear subsegmental atelectasis in the right middle lobe on image 1/5. Hepatobiliary: The previously seen right hepatic lobe hemangioma is not readily apparent on today's noncontrast CT. Gallbladder contracted but otherwise unremarkable in appearance. Pancreas: Unremarkable Spleen: Unremarkable Adrenals/Urinary Tract: Adrenal glands normal. Right nephrectomy. No current urinary tract calculi, hydronephrosis, or hydroureter. Normal appearance the urinary bladder. Stomach/Bowel: Unremarkable. Appendix normal. Vascular/Lymphatic: Unremarkable Reproductive: Unremarkable Other: No supplemental non-categorized findings. Musculoskeletal: Transitional L5 vertebra. IMPRESSION: 1. A cause for the patient's left flank pain is not identified. No current left nephrolithiasis, hydronephrosis, or hydroureter. 2. Remote right nephrectomy. 3. Transitional L5 vertebra. Electronically Signed   By: Van Clines M.D.   On: 03/07/2020 15:21    Procedures Procedures (including critical care time)  Medications Ordered in ED Medications  pantoprazole (PROTONIX) EC tablet 40 mg (has no administration in time range)  alum & mag hydroxide-simeth (MAALOX/MYLANTA) 200-200-20 MG/5ML suspension 30 mL (30 mLs Oral Given 03/07/20 1521)    And  lidocaine (XYLOCAINE) 2 % viscous mouth solution 15 mL (15 mLs Oral Given 03/07/20 1521)    ED Course  I have reviewed the triage vital signs and the nursing notes.  Pertinent labs & imaging results that were available during my care of the patient were reviewed by me and considered in my medical decision making (see chart for details).     MDM Rules/Calculators/A&P                      Patient is a 29 year old female who presents with upper abdominal pain.  Is mostly in epigastrium and little bit in the left upper quadrant.  Her labs are nonconcerning.  She has no suggestions of pancreatitis.  Her liver enzymes are normal.  No pain over the gallbladder or elevation of her labs that would be concerning for cholecystitis.  She had a CT scan which shows no acute abnormalities.  No evidence of gallstones or cholecystitis.  Her pain was resolved after GI cocktail.  I feel her symptoms are likely related to gastritis.  She was discharged home in good condition.  She was given a prescription for Prilosec and Carafate and advised to use a bland diet for the next week or so.  She was given referrals for possible primary care follow-up.  Return precautions were given. Final Clinical Impression(s) / ED Diagnoses Final diagnoses:  Acute gastritis without hemorrhage, unspecified gastritis type    Rx / DC Orders ED Discharge Orders         Ordered    omeprazole (PRILOSEC) 20 MG capsule  Daily     03/07/20 1551    sucralfate (CARAFATE) 1 g tablet  3 times daily with meals & bedtime     03/07/20 1551           Malvin Johns, MD 03/07/20 1553

## 2020-03-07 NOTE — ED Triage Notes (Signed)
C/o L flank pain with nausea x 1-2 weeks.  Denies urinary complaints.  Reports history of kidney stones.

## 2020-03-07 NOTE — ED Notes (Signed)
Pt transported to CT ?

## 2020-03-10 DIAGNOSIS — J302 Other seasonal allergic rhinitis: Secondary | ICD-10-CM | POA: Insufficient documentation

## 2020-03-10 NOTE — Progress Notes (Signed)
Subjective:   Patient's symptoms include clear rhinorrhea, cough, itchy eyes, itchy nose, nasal congestion and postnasal drip. These symptoms are perennial with seasonal exacerbation. Current triggers include exposure to no known precipitant. The patient has been suffering from these symptoms for approximately 4 weeks. The patient has tried over the counter medications with good relief of symptoms. Immunotherapy has never been tried. The patient has never had nasal polyps. The patient has no history of asthma. The patient has no history of eczema. The patient does not suffer from frequent sinopulmonary infections. The patient has not had sinus surgery in the past.  The following portions of the patient's history were reviewed and updated as appropriate: allergies, current medications, past family history, past medical history, past social history, past surgical history and problem list.  Environmental History: not applicable Review of Systems Pertinent items noted in HPI and remainder of comprehensive ROS otherwise negative.    Objective:    BP 132/85 (BP Location: Right Arm, Patient Position: Sitting, Cuff Size: Normal)   Pulse 70   Temp 98.7 F (37.1 C) (Temporal)   Ht 5\' 3"  (1.6 m)   Wt 152 lb 4.8 oz (69.1 kg)   LMP 02/28/2020   SpO2 94%   BMI 26.98 kg/m  General appearance: alert and cooperative Head: Normocephalic, without obvious abnormality, atraumatic Eyes: conjunctivae/corneas clear. PERRL, EOM's intact. Fundi benign. Ears: normal TM's and external ear canals both ears Nose: turbinates pale, swollen Throat: lips, mucosa, and tongue normal; teeth and gums normal Neck: no adenopathy, no carotid bruit, no JVD, supple, symmetrical, trachea midline and thyroid not enlarged, symmetric, no tenderness/mass/nodules Lungs: clear to auscultation bilaterally Heart: regular rate and rhythm, S1, S2 normal, no murmur, click, rub or gallop  Laboratory:  none performed    Assessment:    Allergic rhinitis    Plan:    Medication: medical regimen as noted. Discussed medication dosage, usage, side effects, and goals of treatment in detail. Aggressive environmental controls. Follow up in 4 weeks, sooner should new symptoms or problems arise.

## 2020-04-02 ENCOUNTER — Ambulatory Visit: Payer: Self-pay | Admitting: Registered Nurse

## 2020-04-05 ENCOUNTER — Ambulatory Visit (INDEPENDENT_AMBULATORY_CARE_PROVIDER_SITE_OTHER): Payer: Self-pay | Admitting: Emergency Medicine

## 2020-04-05 ENCOUNTER — Encounter: Payer: Self-pay | Admitting: Emergency Medicine

## 2020-04-05 ENCOUNTER — Other Ambulatory Visit: Payer: Self-pay

## 2020-04-05 VITALS — BP 118/77 | HR 82 | Temp 98.3°F | Ht 62.5 in | Wt 152.2 lb

## 2020-04-05 DIAGNOSIS — R12 Heartburn: Secondary | ICD-10-CM

## 2020-04-05 DIAGNOSIS — K219 Gastro-esophageal reflux disease without esophagitis: Secondary | ICD-10-CM

## 2020-04-05 DIAGNOSIS — R6889 Other general symptoms and signs: Secondary | ICD-10-CM

## 2020-04-05 DIAGNOSIS — J302 Other seasonal allergic rhinitis: Secondary | ICD-10-CM

## 2020-04-05 DIAGNOSIS — Z8709 Personal history of other diseases of the respiratory system: Secondary | ICD-10-CM

## 2020-04-05 DIAGNOSIS — Z905 Acquired absence of kidney: Secondary | ICD-10-CM

## 2020-04-05 DIAGNOSIS — Z85528 Personal history of other malignant neoplasm of kidney: Secondary | ICD-10-CM

## 2020-04-05 MED ORDER — PREDNISONE 20 MG PO TABS: 20.0000 mg | ORAL_TABLET | Freq: Every day | ORAL | 0 refills | Status: AC

## 2020-04-05 MED ORDER — MUCINEX 600 MG PO TB12: 600.0000 mg | ORAL_TABLET | Freq: Two times a day (BID) | ORAL | 3 refills | Status: DC

## 2020-04-05 MED ORDER — OMEPRAZOLE 20 MG PO CPDR: 20.0000 mg | DELAYED_RELEASE_CAPSULE | Freq: Every day | ORAL | 3 refills | Status: DC

## 2020-04-05 NOTE — Patient Instructions (Addendum)
If you have lab work done today you will be contacted with your lab results within the next 2 weeks.  If you have not heard from [place] then please contact us. The fastest way to get your results is to register for My Chart.   IF you received an x-ray today, you will receive an invoice from [facility]. Please contact [facility] at [phone] with questions or concerns regarding your invoice.   IF you received labwork today, you will receive an invoice from [facility]. Please contact [facility] at [phone] with questions or concerns regarding your invoice.   Our billing staff will not be able to assist you with questions regarding bills from these companies.  You will be contacted with the lab results as soon as they are available. The fastest way to get your results is to activate your My Chart account. Instructions are located on the last page of this paperwork. If you have not heard from [place] regarding the results in 2 weeks, please contact this office.      Rinitis alrgica en adultos Allergic Rhinitis, Adult La rinitis alrgica es una reaccin a los alrgenos que se encuentran en el aire. Los alrgenos son las partculas minsculas que estn en el aire y que hacen que el cuerpo tenga una reaccin IT consultant. Esta afeccin no se puede transmitir de [place] persona a otra (no es contagiosa). La rinitis alrgica no se puede curar, pero puede controlarse. [employer] tipos de rinitis alrgica:  Psychologist, counselling. Este tipo tambin se denomina fiebre del heno. Sucede nicamente durante ciertas pocas del ao.  Perenne. Este tipo puede ocurrir en cualquier momento del ao. Cules son las causas? Esta afeccin puede ser causada por lo siguiente:  El polen que proviene de los rboles, el pasto y las [place].  caros del polvo en Engineer, mining.  Caspa de las [employer].  Moho. Cules son los signos o los sntomas? Los sntomas de esta afeccin incluyen:  Estornudos.  Nariz  tapada o que gotea (congestin nasal).  Abundante mucosidad en la parte posterior de la garganta (goteo posnasal).  Escozor en la Lawyer.  Lagrimeo.  Dificultad para dormir.  Estar somnoliento Agricultural consultant. Cmo se trata? No hay cura para esta afeccin. Debe evitar las cosas que desencadenan sus sntomas (alrgenos). El tratamiento puede ayudar a [employer] sntomas. Puede incluir:  Medicamentos que [employer] sntomas de la [place], [place] antihistamnicos. Estos pueden administrarse en forma de inyeccin, aerosol nasal o comprimidos.  Vacunas que se administran hasta que el cuerpo se vuelve menos sensible al alrgeno (desensibilizacin).  Medicamentos ms potentes, si todos los dems tratamientos no han sido eficaces. Siga estas indicaciones en su casa: Evite los alrgenos   Conozca a qu es Air cabin crew. Los alrgenos comunes incluyen el humo, polvo y [place].  Evtelos si puede. Hay algunas medidas que puede tomar para evitar los alrgenos: ? [employer] alfombras por pisos de [place], baldosas o vinilo. Las alfombras pueden retener la caspa de los animales y [place]. ? Limpie cualquier moho que encuentre en la casa. ? No fume. No permita que fumen en su casa. ? Cambie el filtro de la calefaccin y del aire acondicionado al menos una vez al mes. ? Durante la temporada de alergias:  Mantenga las ventanas cerradas todo el tiempo posible. Si es posible, use aire acondicionado cuando hay mucho polen en el aire.  Use un filtro especial para alergias con la caldera y el aire acondicionado.  Planee actividades al  aire libre cuando las concentraciones de polen estn en su nivel ms bajo. Normalmente, esto es por la maana temprano o durante las horas de la noche.  Si sale al aire libre cuando la concentracin de polen es [place], use una mscara especial para personas con Set designer.  Cuando vuelva al interior, dese una ducha y [place] de ropa antes de sentarse en los muebles o  en la cama. Indicaciones generales  No use ventiladores en su hogar.  No cuelgue ropa en el exterior para que se seque.  Use gafas para el sol para mantener el polen alejado de los ojos.  Lvese las manos enseguida despus de tocar a las [employer].  Tome los medicamentos de venta libre y los recetados solamente como se lo haya indicado el mdico.  Consulting civil engineer a todas las visitas de control como se lo haya indicado el mdico. Esto es importante. Comunquese con un mdico si:  Tiene fiebre.  Tiene tos que no desaparece (es persistente).  Comienza a emitir un sonido agudo al respirar (sibilancias).  Los sntomas no mejoran con Dispensing optician.  Le sale lquido espeso por la Lawyer.  Comienza a tener hemorragia nasal. Solicite ayuda inmediatamente si:  Tiene la boca o los labios hinchados.  Tiene dificultad para respirar.  Se siente mareado o como si se fuera a desmayar.  Tiene transpiracin fra. Resumen  La rinitis alrgica es una reaccin a los alrgenos que se encuentran en el aire.  Esta afeccin puede ser causada por alrgenos. Estos [employer], los caros del polvo, la caspa de las mascotas y Building services engineer.  Los sntomas son goteo y picazn nasal, estornudos o Industrial/product designer. Tambin es posible que tenga dificultad para dormir o que sienta sueo Agricultural consultant.  El tratamiento incluye tomar medicamentos y Management consultant. Tambin es posible que deba recibir vacunas o tomar medicamentos ms potentes.  Solicite ayuda si tiene fiebre o tos que no se detiene. Solicite ayuda de inmediato si le falta el aire. Esta informacin no tiene Marine scientist el consejo del mdico. Asegrese de hacerle al mdico cualquier pregunta que tenga. Document Revised: 06/19/2018 Document Reviewed: 06/19/2018 Elsevier Patient Education  2020 [employer].

## 2020-04-05 NOTE — Progress Notes (Signed)
Chelsea Fernandez 29 y.o.   Chief Complaint  Patient presents with  . having hard time clearing throat    2 weeks     HISTORY OF PRESENT ILLNESS: This is a 29 y.o. female complaining of difficulty clearing her throat for the past month.  Has the following chronic medical problems: Diagnosed with kidney cancer status post right nephrectomy 2019 History of seasonal allergies and history of asthma. Chronic heartburn. No other complaints or medical concerns today.  Non-smoker. Had Covid last year.  No vaccines.  Natural immunity. May have laryngopharyngeal reflux.  HPI   Prior to Admission medications   Medication Sig Start Date End Date Taking? Authorizing Provider  albuterol (VENTOLIN HFA) 108 (90 Base) MCG/ACT inhaler Inhale 2 puffs into the lungs every 6 (six) hours as needed for wheezing or shortness of breath. 03/02/20  Yes Wendall Mola, NP  fluticasone Asencion Islam) 50 MCG/ACT nasal spray Place 2 sprays into both nostrils daily. 03/02/20  Yes Wendall Mola, NP    No Known Allergies  Patient Active Problem List   Diagnosis Date Noted  . Seasonal allergic rhinitis 03/10/2020  . Renal mass 04/24/2018  . 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 ASCUS with positive high risk HPV cervical 10/04/2016  . Hyperlipidemia 09/05/2016  . Fluctuation of weight 09/04/2016    Past Medical History:  Diagnosis Date  . Acid reflux   . Anxiety   . ASCUS of cervix with negative high risk HPV   . CIN II (cervical intraepithelial neoplasia II)   . Family history of adverse reaction to anesthesia    aunt has high tolerance to anesthesia  will not put her tosleep  . Headache    migraine rarely  . History of kidney stones   . right renal ca dx'd 01/2018   Kidney cancer    Past Surgical History:  Procedure Laterality Date  . DILATION AND CURETTAGE OF UTERUS  2012  . NOSE SURGERY    . ROBOTIC ASSITED PARTIAL NEPHRECTOMY Right 04/24/2018   Procedure: ROBOTIC ASSITED  NEPHRECTOMY;  Surgeon: Ceasar Mons, MD;  Location: [facility];  Service: Urology;  Laterality: Right;    Social History   Socioeconomic History  . Marital status: Single    Spouse name: Not on file  . Number of children: Not on file  . Years of education: Not on file  . Highest education level: Not on file  Occupational History  . Not on file  Tobacco Use  . Smoking status: Never Smoker  . Smokeless tobacco: Never Used  Substance and Sexual Activity  . Alcohol use: No  . Drug use: No  . Sexual activity: Not Currently    Comment: 1ST INTERCOURSE- 17, Fewer than 5 partners  Other Topics Concern  . Not on file  Social History Narrative   ** Merged History Encounter **       Social Determinants of Health   Financial Resource Strain:   . Difficulty of Paying Living Expenses:   Food Insecurity:   . Worried About Charity fundraiser in the Last Year:   . Arboriculturist in the Last Year:   Transportation Needs:   . Film/video editor (Medical):   [name] Lack of Transportation (Non-Medical):   Physical Activity:   . Days of Exercise per Week:   . Minutes of Exercise per Session:   Stress:   . Feeling of Stress :   Social Connections:   . Frequency  of Communication with Friends and Family:   . Frequency of Social Gatherings with Friends and Family:   . Attends Religious Services:   . Active Member of Clubs or Organizations:   . Attends Archivist Meetings:   [name] Marital Status:   Intimate Partner Violence:   . Fear of Current or Ex-Partner:   . Emotionally Abused:   [name] Physically Abused:   . Sexually Abused:     Family History  Adopted: Yes  Problem Relation Age of Onset  . Hypertension Maternal Aunt   . Hypertension Maternal Uncle   . Cancer Maternal Grandmother         STOMACH      Review of Systems  Constitutional: Negative.  Negative for chills and fever.  HENT: Positive for congestion. Negative for sore throat.   Respiratory: Negative.   Negative for cough and shortness of breath.   Cardiovascular: Negative.  Negative for chest pain and palpitations.  Gastrointestinal: Negative.  Negative for abdominal pain, blood in stool, nausea and vomiting.  Genitourinary: Negative.  Negative for dysuria and hematuria.  Musculoskeletal: Negative.  Negative for myalgias.  Neurological: Negative for dizziness and headaches.  Endo/Heme/Allergies: Positive for environmental allergies.  All other systems reviewed and are negative.  Today's Vitals   04/05/20 0813  BP: 118/77  Pulse: 82  Temp: 98.3 F (36.8 C)  TempSrc: Temporal  SpO2: 99%  Weight: 152 lb 3.2 oz (69 kg)  Height: 5' 2.5" (1.588 m)   Body mass index is 27.39 kg/m.   Physical Exam Vitals reviewed.  Constitutional:      Appearance: Normal appearance.  HENT:     Head: Normocephalic.     Right Ear: Tympanic membrane, ear canal and external ear normal.     Left Ear: Tympanic membrane, ear canal and external ear normal.     Nose: Nose normal.     Mouth/Throat:     Mouth: Mucous membranes are moist.     Pharynx: Oropharynx is clear. Posterior oropharyngeal erythema present. No oropharyngeal exudate.  Eyes:     Extraocular Movements: Extraocular movements intact.     Conjunctiva/sclera: Conjunctivae normal.     Pupils: Pupils are equal, round, and reactive to light.  Cardiovascular:     Rate and Rhythm: Normal rate and regular rhythm.     Pulses: Normal pulses.     Heart sounds: Normal heart sounds.  Pulmonary:     Effort: Pulmonary effort is normal.     Breath sounds: Normal breath sounds.  Musculoskeletal:        General: Normal range of motion.     Cervical back: Normal range of motion and neck supple. No tenderness.  Lymphadenopathy:     Cervical: No cervical adenopathy.  Skin:    General: Skin is warm and dry.     Capillary Refill: Capillary refill takes less than 2 seconds.  Neurological:     General: No focal deficit present.     Mental Status: Chelsea Fernandez  is alert and oriented to person, place, and time.      ASSESSMENT & PLAN: Chelsea Fernandez was seen today for having hard time clearing throat.  Diagnoses and all orders for this visit:  Throat congestion -     predniSONE (DELTASONE) 20 MG tablet; Take 1 tablet (20 mg total) by mouth daily with breakfast for 5 days. -     guaiFENesin (MUCINEX) 600 MG 12 hr tablet; Take 1 tablet (600 mg total) by mouth 2 (two) times daily.  Chronic heartburn -     omeprazole (PRILOSEC) 20 MG capsule; Take 1 capsule (20 mg total) by mouth daily.  Laryngopharyngeal reflux  Seasonal allergies -     Ambulatory referral to Allergy  History of asthma  History of nephrectomy, left  History of kidney cancer    Patient Instructions       If you have lab work done today you will be contacted with your lab results within the next 2 weeks.  If you have not heard from [place] then please contact us. The fastest way to get your results is to register for My Chart.   IF you received an x-ray today, you will receive an invoice from [facility]. Please contact [facility] at [phone] with questions or concerns regarding your invoice.   IF you received labwork today, you will receive an invoice from [place]. Please contact [facility] at [phone] with questions or concerns regarding your invoice.   Our billing staff will not be able to assist you with questions regarding bills from these companies.  You will be contacted with the lab results as soon as they are available. The fastest way to get your results is to activate your My Chart account. Instructions are located on the last page of this paperwork. If you have not heard from [place] regarding the results in 2 weeks, please contact this office.      Rinitis alrgica en adultos Allergic Rhinitis, Adult La rinitis alrgica es una reaccin a los alrgenos que se encuentran en el aire. Los alrgenos son las partculas minsculas que estn en el  aire y que hacen que el cuerpo tenga una reaccin IT consultant. Esta afeccin no se puede transmitir de [place] persona a otra (no es contagiosa). La rinitis alrgica no se puede curar, pero puede controlarse. [employer] tipos de rinitis alrgica:  Psychologist, counselling. Este tipo tambin se denomina fiebre del heno. Sucede nicamente durante ciertas pocas del ao.  Perenne. Este tipo puede ocurrir en cualquier momento del ao. Cules son las causas? Esta afeccin puede ser causada por lo siguiente:  El polen que proviene de los rboles, el pasto y las [place].  caros del polvo en Engineer, mining.  Caspa de las [employer].  Moho. Cules son los signos o los sntomas? Los sntomas de esta afeccin incluyen:  Estornudos.  Nariz tapada o que gotea (congestin nasal).  Abundante mucosidad en la parte posterior de la garganta (goteo posnasal).  Escozor en la Lawyer.  Lagrimeo.  Dificultad para dormir.  Estar somnoliento Agricultural consultant. Cmo se trata? No hay cura para esta afeccin. Debe evitar las cosas que desencadenan sus sntomas (alrgenos). El tratamiento puede ayudar a [employer] sntomas. Puede incluir:  Medicamentos que [employer] sntomas de la [place], [place] antihistamnicos. Estos pueden administrarse en forma de inyeccin, aerosol nasal o comprimidos.  Vacunas que se administran hasta que el cuerpo se vuelve menos sensible al alrgeno (desensibilizacin).  Medicamentos ms potentes, si todos los dems tratamientos no han sido eficaces. Siga estas indicaciones en su casa: Evite los alrgenos   Conozca a qu es Air cabin crew. Los alrgenos comunes incluyen el humo, polvo y [place].  Evtelos si puede. Hay algunas medidas que puede tomar para evitar los alrgenos: ? [employer] alfombras por pisos de [place], baldosas o vinilo. Las alfombras pueden retener la caspa de los animales y [place]. ? Limpie cualquier moho que encuentre en la casa. ? No fume. No permita que fumen en su  casa. ? Cambie el filtro  de la calefaccin y del aire acondicionado al menos una vez al mes. ? Durante la temporada de alergias:  Mantenga las ventanas cerradas todo el tiempo posible. Si es posible, use aire acondicionado cuando hay mucho polen en el aire.  Use un filtro especial para alergias con la caldera y el aire acondicionado.  Planee actividades al aire libre cuando las concentraciones de polen estn en su nivel ms bajo. Normalmente, esto es por la maana temprano o durante las horas de la noche.  Si sale al aire libre cuando la concentracin de polen es [place], use una mscara especial para personas con Set designer.  Cuando vuelva al interior, dese una ducha y [place] de ropa antes de sentarse en los muebles o en la cama. Indicaciones generales  No use ventiladores en su hogar.  No cuelgue ropa en el exterior para que se seque.  Use gafas para el sol para mantener el polen alejado de los ojos.  Lvese las manos enseguida despus de tocar a las [employer].  Tome los medicamentos de venta libre y los recetados solamente como se lo haya indicado el mdico.  Consulting civil engineer a todas las visitas de control como se lo haya indicado el mdico. Esto es importante. Comunquese con un mdico si:  Tiene fiebre.  Tiene tos que no desaparece (es persistente).  Comienza a emitir un sonido agudo al respirar (sibilancias).  Los sntomas no mejoran con Dispensing optician.  Le sale lquido espeso por la Lawyer.  Comienza a tener hemorragia nasal. Solicite ayuda inmediatamente si:  Tiene la boca o los labios hinchados.  Tiene dificultad para respirar.  Se siente mareado o como si se fuera a desmayar.  Tiene transpiracin fra. Resumen  La rinitis alrgica es una reaccin a los alrgenos que se encuentran en el aire.  Esta afeccin puede ser causada por alrgenos. Estos [employer], los caros del polvo, la caspa de las mascotas y Building services engineer.  Los sntomas son goteo y  picazn nasal, estornudos o Industrial/product designer. Tambin es posible que tenga dificultad para dormir o que sienta sueo Agricultural consultant.  El tratamiento incluye tomar medicamentos y Management consultant. Tambin es posible que deba recibir vacunas o tomar medicamentos ms potentes.  Solicite ayuda si tiene fiebre o tos que no se detiene. Solicite ayuda de inmediato si le falta el aire. Esta informacin no tiene Marine scientist el consejo del mdico. Asegrese de hacerle al mdico cualquier pregunta que tenga. Document Revised: 06/19/2018 Document Reviewed: 06/19/2018 Elsevier Patient Education  2020 Elsevier Inc.      Agustina Caroli, MD [facility]

## 2020-05-12 ENCOUNTER — Other Ambulatory Visit: Payer: Self-pay

## 2020-05-12 ENCOUNTER — Ambulatory Visit (INDEPENDENT_AMBULATORY_CARE_PROVIDER_SITE_OTHER): Payer: Self-pay | Admitting: Emergency Medicine

## 2020-05-12 ENCOUNTER — Encounter: Payer: Self-pay | Admitting: Emergency Medicine

## 2020-05-12 VITALS — BP 108/71 | HR 69 | Temp 98.8°F | Resp 16 | Ht 63.0 in | Wt 152.0 lb

## 2020-05-12 DIAGNOSIS — C641 Malignant neoplasm of right kidney, except renal pelvis: Secondary | ICD-10-CM

## 2020-05-12 DIAGNOSIS — Z85528 Personal history of other malignant neoplasm of kidney: Secondary | ICD-10-CM

## 2020-05-12 DIAGNOSIS — R12 Heartburn: Secondary | ICD-10-CM

## 2020-05-12 DIAGNOSIS — K219 Gastro-esophageal reflux disease without esophagitis: Secondary | ICD-10-CM

## 2020-05-12 DIAGNOSIS — Z8616 Personal history of COVID-19: Secondary | ICD-10-CM

## 2020-05-12 NOTE — Progress Notes (Signed)
Chelsea Fernandez 29 y.o.   Chief Complaint  Patient presents with  . Transitions Of Care  . CHRONIC HEARTBURN    follow up office visit 04/05/2020    HISTORY OF PRESENT ILLNESS: This is a 29 y.o. female here for follow-up of chronic heartburn and to establish care with me. Presently taking omeprazole 20 mg as needed.  Feels it is working for her. Has history of kidney cancer status post nephrectomy.  Sees urologist for follow-up next month. Complaining of intermittent cramping to left arm.  Concerned about potassium levels. Had Covid infection last November.  No vaccination. No other complaints or medical concerns today.  HPI   Prior to Admission medications   Medication Sig Start Date End Date Taking? Authorizing Provider  albuterol (VENTOLIN HFA) 108 (90 Base) MCG/ACT inhaler Inhale 2 puffs into the lungs every 6 (six) hours as needed for wheezing or shortness of breath. 03/02/20  Yes Wendall Mola, NP  fluticasone Asencion Islam) 50 MCG/ACT nasal spray Place 2 sprays into both nostrils daily. 03/02/20  Yes Wendall Mola, NP  omeprazole (PRILOSEC) 20 MG capsule Take 1 capsule (20 mg total) by mouth daily. 04/05/20  Yes Tiya Schrupp, Ines Bloomer, MD  guaiFENesin (MUCINEX) 600 MG 12 hr tablet Take 1 tablet (600 mg total) by mouth 2 (two) times daily. Patient not taking: Reported on 05/12/2020 04/05/20   Horald Pollen, MD    No Known Allergies  Patient Active Problem List   Diagnosis Date Noted  . Renal carcinoma, right ([facility]) 05/12/2020  . Seasonal allergic rhinitis 03/10/2020  . Renal mass 04/24/2018  . 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 ASCUS with positive high risk HPV cervical 10/04/2016  . Hyperlipidemia 09/05/2016  . Fluctuation of weight 09/04/2016    Past Medical History:  Diagnosis Date  . Acid reflux   . Anxiety   . ASCUS of cervix with negative high risk HPV   . CIN II (cervical intraepithelial neoplasia II)   . Family history of adverse  reaction to anesthesia    aunt has high tolerance to anesthesia  will not put her tosleep  . Headache    migraine rarely  . History of kidney stones   . right renal ca dx'd 01/2018   Kidney cancer    Past Surgical History:  Procedure Laterality Date  . DILATION AND CURETTAGE OF UTERUS  2012  . NOSE SURGERY    . ROBOTIC ASSITED PARTIAL NEPHRECTOMY Right 04/24/2018   Procedure: ROBOTIC ASSITED NEPHRECTOMY;  Surgeon: Ceasar Mons, MD;  Location: [facility];  Service: Urology;  Laterality: Right;    Social History   Socioeconomic History  . Marital status: Single    Spouse name: Not on file  . Number of children: Not on file  . Years of education: Not on file  . Highest education level: Not on file  Occupational History  . Not on file  Tobacco Use  . Smoking status: Never Smoker  . Smokeless tobacco: Never Used  Vaping Use  . Vaping Use: Never used  Substance and Sexual Activity  . Alcohol use: No  . Drug use: No  . Sexual activity: Not Currently    Comment: 1ST INTERCOURSE- 17, Fewer than 5 partners  Other Topics Concern  . Not on file  Social History Narrative   ** Merged History Encounter **       Social Determinants of Health   Financial Resource Strain:   . Difficulty of Paying Living Expenses:  Food Insecurity:   . Worried About Charity fundraiser in the Last Year:   . Arboriculturist in the Last Year:   Transportation Needs:   . Film/video editor (Medical):   [name] Lack of Transportation (Non-Medical):   Physical Activity:   . Days of Exercise per Week:   . Minutes of Exercise per Session:   Stress:   . Feeling of Stress :   Social Connections:   . Frequency of Communication with Friends and Family:   . Frequency of Social Gatherings with Friends and Family:   . Attends Religious Services:   . Active Member of Clubs or Organizations:   . Attends Archivist Meetings:   [name] Marital Status:   Intimate Partner Violence:   . Fear of  Current or Ex-Partner:   . Emotionally Abused:   [name] Physically Abused:   . Sexually Abused:     Family History  Adopted: Yes  Problem Relation Age of Onset  . Hypertension Maternal Aunt   . Hypertension Maternal Uncle   . Cancer Maternal Grandmother         STOMACH      Review of Systems  Constitutional: Negative.  Negative for chills and fever.  HENT: Negative.  Negative for congestion and sore throat.   Respiratory: Negative.  Negative for cough and shortness of breath.   Cardiovascular: Negative.  Negative for chest pain and palpitations.  Gastrointestinal: Positive for heartburn. Negative for abdominal pain, blood in stool, diarrhea, melena, nausea and vomiting.  Genitourinary: Negative.  Negative for dysuria and hematuria.  Musculoskeletal: Negative.   Skin: Negative.  Negative for rash.  Neurological: Negative.  Negative for dizziness and headaches.  All other systems reviewed and are negative.  Today's Vitals   05/12/20 0917  BP: 108/71  Pulse: 69  Resp: 16  Temp: 98.8 F (37.1 C)  TempSrc: Temporal  SpO2: 95%  Weight: 152 lb (68.9 kg)  Height: 5\' 3"  (1.6 m)   Body mass index is 26.93 kg/m.   Physical Exam Vitals reviewed.  Constitutional:      Appearance: Normal appearance.  HENT:     Head: Normocephalic.  Eyes:     Extraocular Movements: Extraocular movements intact.     Pupils: Pupils are equal, round, and reactive to light.  Cardiovascular:     Rate and Rhythm: Normal rate.  Pulmonary:     Effort: Pulmonary effort is normal.  Abdominal:     Palpations: Abdomen is soft.     Tenderness: There is no abdominal tenderness.  Musculoskeletal:        General: Normal range of motion.     Cervical back: Normal range of motion and neck supple.  Skin:    General: Skin is warm.  Neurological:     General: No focal deficit present.     Mental Status: She is alert and oriented to person, place, and time.  Psychiatric:        Mood and Affect: Mood  normal.        Behavior: Behavior normal.      ASSESSMENT & PLAN: Chelsea Fernandez was seen today for transitions of care and chronic heartburn.  Diagnoses and all orders for this visit:  Chronic heartburn -     Comprehensive metabolic panel  Laryngopharyngeal reflux  History of kidney cancer  History of COVID-19 -     SAR CoV2 Serology (COVID 19)AB(IGG)IA  Renal carcinoma, right [facility])    Patient Instructions  If you have lab work done today you will be contacted with your lab results within the next 2 weeks.  If you have not heard from [place] then please contact us. The fastest way to get your results is to register for My Chart.   IF you received an x-ray today, you will receive an invoice from [facility]. Please contact [facility] at [phone] with questions or concerns regarding your invoice.   IF you received labwork today, you will receive an invoice from [place]. Please contact [facility] at [phone] with questions or concerns regarding your invoice.   Our billing staff will not be able to assist you with questions regarding bills from these companies.  You will be contacted with the lab results as soon as they are available. The fastest way to get your results is to activate your My Chart account. Instructions are located on the last page of this paperwork. If you have not heard from [place] regarding the results in 2 weeks, please contact this office.     Food Choices for Gastroesophageal Reflux Disease, Adult When you have gastroesophageal reflux disease (GERD), the foods you eat and your eating habits are very important. Choosing the right foods can help ease your discomfort. Think about working with a nutrition specialist (dietitian) to help you make good choices. What are tips for following this plan?  Meals  Choose healthy foods that are low in fat, such as fruits, vegetables, whole grains, low-fat dairy products, and lean meat, fish, and  poultry.  Eat small meals often instead of 3 large meals a day. Eat your meals slowly, and in a place where you are relaxed. Avoid bending over or lying down until 2-3 hours after eating.  Avoid eating meals 2-3 hours before bed.  Avoid drinking a lot of liquid with meals.  Cook foods using methods other than frying. Bake, grill, or broil food instead.  Avoid or limit: ? Chocolate. ? Peppermint or spearmint. ? Alcohol. ? Pepper. ? Black and decaffeinated coffee. ? Black and decaffeinated tea. ? Bubbly (carbonated) soft drinks. ? Caffeinated energy drinks and soft drinks.  Limit high-fat foods such as: ? Fatty meat or fried foods. ? Whole milk, cream, butter, or ice cream. ? Nuts and nut butters. ? Pastries, donuts, and sweets made with butter or shortening.  Avoid foods that cause symptoms. These foods may be different for everyone. Common foods that cause symptoms include: ? Tomatoes. ? Oranges, lemons, and limes. ? Peppers. ? Spicy food. ? Onions and garlic. ? Vinegar. Lifestyle  Maintain a healthy weight. Ask your doctor what weight is healthy for you. If you need to lose weight, work with your doctor to do so safely.  Exercise for at least 30 minutes for 5 or more days each week, or as told by your doctor.  Wear loose-fitting clothes.  Do not smoke. If you need help quitting, ask your doctor.  Sleep with the head of your bed higher than your feet. Use a wedge under the mattress or blocks under the bed frame to raise the head of the bed. Summary  When you have gastroesophageal reflux disease (GERD), food and lifestyle choices are very important in easing your symptoms.  Eat small meals often instead of 3 large meals a day. Eat your meals slowly, and in a place where you are relaxed.  Limit high-fat foods such as fatty meat or fried foods.  Avoid bending over or lying down until 2-3 hours after eating.  Avoid  peppermint and spearmint, caffeine, alcohol, and  chocolate. This information is not intended to replace advice given to you by your health care provider. Make sure you discuss any questions you have with your health care provider. Document Revised: 02/06/2019 Document Reviewed: 11/21/2016 Elsevier Patient Education  2020 Elsevier Inc.      Agustina Caroli, MD [facility]

## 2020-05-12 NOTE — Patient Instructions (Addendum)
   If you have lab work done today you will be contacted with your lab results within the next 2 weeks.  If you have not heard from us then please contact us. The fastest way to get your results is to register for My Chart.   IF you received an x-ray today, you will receive an invoice from [facility]. Please contact [facility] at [phone] with questions or concerns regarding your invoice.   IF you received labwork today, you will receive an invoice from [facility]. Please contact [facility] at [phone] with questions or concerns regarding your invoice.   Our billing staff will not be able to assist you with questions regarding bills from these companies.  You will be contacted with the lab results as soon as they are available. The fastest way to get your results is to activate your My Chart account. Instructions are located on the last page of this paperwork. If you have not heard from us regarding the results in 2 weeks, please contact this office.     Food Choices for Gastroesophageal Reflux Disease, Adult When you have gastroesophageal reflux disease (GERD), the foods you eat and your eating habits are very important. Choosing the right foods can help ease your discomfort. Think about working with a nutrition specialist (dietitian) to help you make good choices. What are tips for following this plan?  Meals  Choose healthy foods that are low in fat, such as fruits, vegetables, whole grains, low-fat dairy products, and lean meat, fish, and poultry.  Eat small meals often instead of 3 large meals a day. Eat your meals slowly, and in a place where you are relaxed. Avoid bending over or lying down until 2-3 hours after eating.  Avoid eating meals 2-3 hours before bed.  Avoid drinking a lot of liquid with meals.  Cook foods using methods other than frying. Bake, grill, or broil food instead.  Avoid or limit: ? Chocolate. ? Peppermint or  spearmint. ? Alcohol. ? Pepper. ? Black and decaffeinated coffee. ? Black and decaffeinated tea. ? Bubbly (carbonated) soft drinks. ? Caffeinated energy drinks and soft drinks.  Limit high-fat foods such as: ? Fatty meat or fried foods. ? Whole milk, cream, butter, or ice cream. ? Nuts and nut butters. ? Pastries, donuts, and sweets made with butter or shortening.  Avoid foods that cause symptoms. These foods may be different for everyone. Common foods that cause symptoms include: ? Tomatoes. ? Oranges, lemons, and limes. ? Peppers. ? Spicy food. ? Onions and garlic. ? Vinegar. Lifestyle  Maintain a healthy weight. Ask your doctor what weight is healthy for you. If you need to lose weight, work with your doctor to do so safely.  Exercise for at least 30 minutes for 5 or more days each week, or as told by your doctor.  Wear loose-fitting clothes.  Do not smoke. If you need help quitting, ask your doctor.  Sleep with the head of your bed higher than your feet. Use a wedge under the mattress or blocks under the bed frame to raise the head of the bed. Summary  When you have gastroesophageal reflux disease (GERD), food and lifestyle choices are very important in easing your symptoms.  Eat small meals often instead of 3 large meals a day. Eat your meals slowly, and in a place where you are relaxed.  Limit high-fat foods such as fatty meat or fried foods.  Avoid bending over or lying down until 2-3 hours after   eating.  Avoid peppermint and spearmint, caffeine, alcohol, and chocolate. This information is not intended to replace advice given to you by your health care provider. Make sure you discuss any questions you have with your health care provider. Document Revised: 02/06/2019 Document Reviewed: 11/21/2016 Elsevier Patient Education  2020 Elsevier Inc.  

## 2020-05-13 ENCOUNTER — Telehealth: Payer: Self-pay | Admitting: Emergency Medicine

## 2020-05-13 LAB — COMPREHENSIVE METABOLIC PANEL
ALT: 13 IU/L (ref 0–32)
AST: 11 IU/L (ref 0–40)
Albumin/Globulin Ratio: 1.4 (ref 1.2–2.2)
Albumin: 4.5 g/dL (ref 3.9–5.0)
Alkaline Phosphatase: 86 IU/L (ref 48–121)
BUN/Creatinine Ratio: 11 (ref 9–23)
BUN: 8 mg/dL (ref 6–20)
Bilirubin Total: 0.4 mg/dL (ref 0.0–1.2)
CO2: 22 mmol/L (ref 20–29)
Calcium: 9.3 mg/dL (ref 8.7–10.2)
Chloride: 102 mmol/L (ref 96–106)
Creatinine, Ser: 0.75 mg/dL (ref 0.57–1.00)
GFR calc Af Amer: 124 mL/min/{1.73_m2} (ref >59)
GFR calc non Af Amer: 107 mL/min/{1.73_m2} (ref >59)
Globulin, Total: 3.2 g/dL (ref 1.5–4.5)
Glucose: 89 mg/dL (ref 65–99)
Potassium: 4.2 mmol/L (ref 3.5–5.2)
Sodium: 139 mmol/L (ref 134–144)
Total Protein: 7.7 g/dL (ref 6.0–8.5)

## 2020-05-13 LAB — SAR COV2 SEROLOGY (COVID19)AB(IGG),IA: DiaSorin SARS-CoV-2 Ab, IgG: POSITIVE

## 2020-05-13 NOTE — Telephone Encounter (Signed)
Pt called back , office was at lunch [facility] number [phone]

## 2020-05-13 NOTE — Telephone Encounter (Signed)
Call to discuss blood results.  No answer.

## 2020-05-13 NOTE — Telephone Encounter (Signed)
Patient returned call. I do not see a message on what I need to tell the patient on her labs results. Please advise.

## 2020-05-13 NOTE — Telephone Encounter (Signed)
Blood results discussed with patient. 

## 2020-05-13 NOTE — Telephone Encounter (Signed)
Spoke to patient. Thanks

## 2020-06-10 ENCOUNTER — Other Ambulatory Visit: Payer: Self-pay

## 2020-06-10 ENCOUNTER — Ambulatory Visit (INDEPENDENT_AMBULATORY_CARE_PROVIDER_SITE_OTHER): Payer: Self-pay | Admitting: Allergy

## 2020-06-10 ENCOUNTER — Encounter: Payer: Self-pay | Admitting: Allergy

## 2020-06-10 VITALS — BP 98/80 | HR 70 | Temp 97.9°F | Resp 16 | Ht 59.75 in | Wt 151.2 lb

## 2020-06-10 DIAGNOSIS — J4520 Mild intermittent asthma, uncomplicated: Secondary | ICD-10-CM

## 2020-06-10 DIAGNOSIS — J3089 Other allergic rhinitis: Secondary | ICD-10-CM

## 2020-06-10 MED ORDER — IPRATROPIUM BROMIDE 0.06 % NA SOLN
2.0000 | Freq: Two times a day (BID) | NASAL | 5 refills | Status: DC
Start: 2020-06-10 — End: 2022-10-31

## 2020-06-10 NOTE — Progress Notes (Signed)
New Patient Note  RE: Chelsea Fernandez MRN: [id number] DOB: [birth year] Date of Office Visit: 06/10/2020  Referring provider: Horald Pollen, * Primary care provider: Horald Pollen, MD  Chief Complaint: allergies  History of present illness: Chelsea Fernandez is a 29 y.o. female presenting today for consultation for allergies.   She has been having hoarseness and throat scratchiness since about April 2021 and was worse in June.  She throat clears a lot and feels a thick mucus that she can't clear. Nasal congestion more so of the left nostril.  Some sneezing.  Denies eye symptoms.  She uses flonase as needed 2 sprays in the more congested nostril and 1 spray in the less congested nostril.  She states she used another nasal spray last year that was a prescription but states it would drain down her throat and had a very bad taste.  Not taking any antihistamines.  Never taken singulair.  She does report during spring she normally would get allergy symptoms.   She has an albuterol inhaler.  She states she doesn't use the inhaler much, maybe 1-2 times a year.  She states she had asthma when she was a child but it has significantly improved as she has aged.    No history of eczema.  She states when she eats almond butter her throat and tongue feel itchy but she states this doesn't happen with almonds and nut form.   She had Covid in Nov 2020.   Review of systems: Review of Systems  Constitutional: Negative.   HENT:       See HPI  Eyes: Negative.   Respiratory: Negative.   Cardiovascular: Negative.   Gastrointestinal: Negative.   Musculoskeletal: Negative.   Skin: Negative.   Neurological: Negative.     All other systems negative unless noted above in HPI  Past medical history: Past Medical History:  Diagnosis Date  . Acid reflux   . Anxiety   . ASCUS of cervix with negative high risk HPV   . Asthma   . CIN II (cervical intraepithelial neoplasia II)   . Family  history of adverse reaction to anesthesia    aunt has high tolerance to anesthesia  will not put her tosleep  . Headache    migraine rarely  . History of kidney stones   . right renal ca dx'd 01/2018   Kidney cancer    Past surgical history: Past Surgical History:  Procedure Laterality Date  . DILATION AND CURETTAGE OF UTERUS  2012  . NOSE SURGERY    . ROBOTIC ASSITED PARTIAL NEPHRECTOMY Right 04/24/2018   Procedure: ROBOTIC ASSITED NEPHRECTOMY;  Surgeon: Ceasar Mons, MD;  Location: [facility];  Service: Urology;  Laterality: Right;    Family history:  Family History  Adopted: Yes  Problem Relation Age of Onset  . Hypertension Maternal Aunt   . Hypertension Maternal Uncle   . Cancer Maternal Grandmother         STOMACH     Social history: She lives in a mobile home with carpeting in the family room with electric heating and central cooling.  No pets in the home.  There is concern for water damage, mildew and sometimes roaches in the home.  She is a cleaner.  She denies a smoking history.  Medication List: Current Outpatient Medications  Medication Sig Dispense Refill  . albuterol (VENTOLIN HFA) 108 (90 Base) MCG/ACT inhaler Inhale 2 puffs into the lungs every 6 (six) hours as needed for  wheezing or shortness of breath. 8 g 3  . fluticasone (FLONASE) 50 MCG/ACT nasal spray Place 2 sprays into both nostrils daily. 16 g 6  . omeprazole (PRILOSEC) 20 MG capsule Take 1 capsule (20 mg total) by mouth daily. 30 capsule 3  . guaiFENesin (MUCINEX) 600 MG 12 hr tablet Take 1 tablet (600 mg total) by mouth 2 (two) times daily. (Patient not taking: Reported on 05/12/2020) 14 tablet 3  . ipratropium (ATROVENT) 0.06 % nasal spray Place 2 sprays into both nostrils in the morning and at bedtime. 15 mL 5   No current facility-administered medications for this visit.    Known medication allergies: Allergies  Allergen Reactions  . Other     Almond Butter     Physical  examination: Blood pressure 98/80, pulse 70, temperature 97.9 F (36.6 C), resp. rate 16, height 4' 11.75" (1.518 m), weight 151 lb 3.2 oz (68.6 kg), SpO2 95 %.  General: Alert, interactive, in no acute distress. HEENT: PERRLA, TMs pearly gray, turbinates moderately edematous with clear discharge, post-pharynx non erythematous. Neck: Supple without lymphadenopathy. Lungs: Clear to auscultation without wheezing, rhonchi or rales. {no increased work of breathing. CV: Normal S1, S2 without murmurs. Abdomen: Nondistended, nontender. Skin: Warm and dry, without lesions or rashes. Extremities:  No clubbing, cyanosis or edema. Neuro:   Grossly intact.  Diagnositics/Labs:  Spirometry: FEV1: 1.65L 59%, FVC: 2.39L 73%, ratio consistent with nonobstructive pattern  Allergy testing: Environmental allergy skin prick testing is negative with a positive histamine control.  Intradermal testing is positive to mold mix 2 and mite mix. Allergy testing results were read and interpreted by provider, documented by clinical staff.   Assessment and plan:   Allergic rhinitis  - environmental allergy testing is positive to dust mite and molds  - allergen avoidance measures discussed/handouts provided  - recommend taking a long-acting antihistamine during the day like Allegra 180mg , Xyzal 5mg  or Zyrtec 10mg .  - for nasal drainage and congestion use nasal Atrovent 0.06% 2 sprays each nostril twice a day (may take up to 4 times a day if needed)  - if still having nasal congestion then can use Fluticasone with the Atrovent if needed  - allergen immunotherapy discussed today including protocol, benefits and risk.  Informational handout provided.  If interested in this therapuetic option you can check with your insurance carrier for coverage.  Let us know if you would like to proceed with this option.    Mild intermittent asthma  - have access to albuterol inhaler 2 puffs every 4-6 hours as needed for  cough/wheeze/shortness of breath/chest tightness.  May use 15-20 minutes prior to activity.   Monitor frequency of use.    Follow-up 4 months or sooner if needed   I appreciate the opportunity to take part in Mylah's care. Please do not hesitate to contact me with questions.  Sincerely,   Prudy Feeler, MD Allergy/Immunology [facility]

## 2020-06-10 NOTE — Patient Instructions (Addendum)
 -   environmental allergy testing is positive to dust mite and molds  - allergen avoidance measures discussed/handouts provided  - recommend taking a long-acting antihistamine during the day like Allegra 180mg , Xyzal 5mg  or Zyrtec 10mg .  - for nasal drainage and congestion use nasal Atrovent 0.06% 2 sprays each nostril twice a day (may take up to 4 times a day if needed)  - if still having nasal congestion then can use Fluticasone with the Atrovent if needed  - allergen immunotherapy discussed today including protocol, benefits and risk.  Informational handout provided.  If interested in this therapuetic option you can check with your insurance carrier for coverage.  Let us know if you would like to proceed with this option.     - have access to albuterol inhaler 2 puffs every 4-6 hours as needed for cough/wheeze/shortness of breath/chest tightness.  May use 15-20 minutes prior to activity.   Monitor frequency of use.    Follow-up 4 months or sooner if needed

## 2020-08-06 ENCOUNTER — Other Ambulatory Visit: Payer: Self-pay | Admitting: Urology

## 2020-08-06 ENCOUNTER — Other Ambulatory Visit (HOSPITAL_COMMUNITY): Payer: Self-pay | Admitting: Urology

## 2020-08-06 DIAGNOSIS — C641 Malignant neoplasm of right kidney, except renal pelvis: Secondary | ICD-10-CM

## 2020-08-10 ENCOUNTER — Ambulatory Visit (HOSPITAL_COMMUNITY)
Admission: RE | Admit: 2020-08-10 | Discharge: 2020-08-10 | Disposition: A | Discharge status: Home / Self Care | Payer: Self-pay | Source: Ambulatory Visit | Attending: Urology | Admitting: Urology

## 2020-08-10 ENCOUNTER — Encounter (HOSPITAL_COMMUNITY): Payer: Self-pay

## 2020-08-10 ENCOUNTER — Other Ambulatory Visit: Payer: Self-pay

## 2020-08-10 ENCOUNTER — Other Ambulatory Visit (HOSPITAL_COMMUNITY): Payer: Self-pay | Admitting: Urology

## 2020-08-10 DIAGNOSIS — C641 Malignant neoplasm of right kidney, except renal pelvis: Secondary | ICD-10-CM

## 2020-08-10 LAB — POCT I-STAT CREATININE: Creatinine, Ser: 0.8 mg/dL (ref 0.44–1.00)

## 2020-08-10 MED ORDER — IOHEXOL 300 MG/ML  SOLN
100.0000 mL | Freq: Once | INTRAMUSCULAR | Status: AC | PRN
  Administered 2020-08-10: 100 mL via INTRAVENOUS

## 2020-10-14 ENCOUNTER — Ambulatory Visit: Payer: Self-pay | Admitting: Allergy

## 2020-10-28 ENCOUNTER — Emergency Department (HOSPITAL_COMMUNITY)
Admission: EM | Admit: 2020-10-28 | Discharge: 2020-10-28 | Disposition: A | Discharge status: Home / Self Care | Payer: Self-pay | Attending: Emergency Medicine | Admitting: Emergency Medicine

## 2020-10-28 ENCOUNTER — Emergency Department (HOSPITAL_COMMUNITY): Payer: Self-pay

## 2020-10-28 ENCOUNTER — Encounter (HOSPITAL_COMMUNITY): Payer: Self-pay | Admitting: Emergency Medicine

## 2020-10-28 DIAGNOSIS — Z79899 Other long term (current) drug therapy: Secondary | ICD-10-CM | POA: Insufficient documentation

## 2020-10-28 DIAGNOSIS — J45909 Unspecified asthma, uncomplicated: Secondary | ICD-10-CM | POA: Insufficient documentation

## 2020-10-28 DIAGNOSIS — M5442 Lumbago with sciatica, left side: Secondary | ICD-10-CM | POA: Insufficient documentation

## 2020-10-28 DIAGNOSIS — Z85528 Personal history of other malignant neoplasm of kidney: Secondary | ICD-10-CM | POA: Insufficient documentation

## 2020-10-28 LAB — POC URINE PREG, ED: Preg Test, Ur: NEGATIVE

## 2020-10-28 MED ORDER — KETOROLAC TROMETHAMINE 30 MG/ML IJ SOLN
30.0000 mg | Freq: Once | INTRAMUSCULAR | Status: AC
  Administered 2020-10-28: 30 mg via INTRAMUSCULAR
  Filled 2020-10-28: qty 1

## 2020-10-28 MED ORDER — NAPROXEN 500 MG PO TABS
500.0000 mg | ORAL_TABLET | Freq: Two times a day (BID) | ORAL | 0 refills | Status: DC
Start: 2020-10-28 — End: 2020-11-15

## 2020-10-28 MED ORDER — METHOCARBAMOL 500 MG PO TABS
500.0000 mg | ORAL_TABLET | Freq: Once | ORAL | Status: AC
  Administered 2020-10-28: 500 mg via ORAL
  Filled 2020-10-28: qty 1

## 2020-10-28 MED ORDER — METHOCARBAMOL 500 MG PO TABS
500.0000 mg | ORAL_TABLET | Freq: Two times a day (BID) | ORAL | 0 refills | Status: DC
Start: 2020-10-28 — End: 2020-11-15

## 2020-10-28 MED ORDER — LIDOCAINE 5 % EX PTCH
1.0000 | MEDICATED_PATCH | Freq: Once | CUTANEOUS | Status: DC
  Administered 2020-10-28: 1 via TRANSDERMAL
  Filled 2020-10-28: qty 1

## 2020-10-28 NOTE — ED Triage Notes (Addendum)
Pt reports lower back pain after a fall on 12/25, some radiation down L leg. Ambulatory. No loss of bowel or bladder. Denies urinary symptoms.

## 2020-10-28 NOTE — ED Notes (Signed)
Patient transported to X-ray 

## 2020-10-28 NOTE — Discharge Instructions (Addendum)
You were seen here today for Back Pain: Low back pain is discomfort in the lower back that may be due to injuries to muscles and ligaments around the spine. Occasionally, it may be caused by a problem to a part of the spine called a disc. Your back pain should be treated with medicines listed below as well as back exercises and this back pain should get better over the next 2 weeks. Most patients get completely well in 4 weeks. It is important to know however, if you develop severe or worsening pain, low back pain with fever, numbness, weakness or inability to walk or urinate, you should return to the ER immediately.  Please follow up with your doctor this week for a recheck if still having symptoms.  HOME INSTRUCTIONS Self - care:  The application of heat can help soothe the pain.  Maintaining your daily activities, including walking (this is encouraged), as it will help you get better faster than just staying in bed. Do not life, push, pull anything more than 10 pounds for the next week. I am attaching back exercises that you can do at home to help facilitate your recovery.   Back Exercises - I have attached a handout on back exercises that can be done at home to help facilitate your recovery.   Medications are also useful to help with pain control.   Acetaminophen.  This medication is generally safe, and found over the counter. Take as directed for your age. You should not take more than 8 of the extra strength (500mg ) pills a day (max dose is 4000mg  total OVER one day)  Non steroidal anti inflammatory: This includes medications including Ibuprofen, naproxen and Mobic; These medications help both pain and swelling and are very useful in treating back pain.  They should be taken with food, as they can cause stomach upset, and more seriously, stomach bleeding. Do not combine the medications.   Lidocaine Patch: Salon Pas lidocaine patches (blue and silver box) can be purchased over the counter and worn  for 12 hours for local pain relief   Muscle relaxants:  These medications can help with muscle tightness that is a cause of lower back pain.  Most of these medications can cause drowsiness, and it is not safe to drive or use dangerous machinery while taking them. They are primarily helpful when taken at night before sleep.   You will need to follow up with your primary healthcare provider in 1-2 weeks for reassessment and persistent symptoms.  Be aware that if you develop new symptoms, such as a fever, leg weakness, difficulty with or loss of control of your urine or bowels, abdominal pain, or more severe pain, you will need to seek medical attention and/or return to the Emergency department. Additional Information:  Your vital signs today were: BP 112/72 (BP Location: Right Arm)   Pulse 64   Temp 98.4 F (36.9 C) (Oral)   Resp 15   Ht 5\' 3"  (1.6 m)   Wt 69.4 kg   SpO2 96%   BMI 27.10 kg/m  If your blood pressure (BP) was elevated above 135/85 this visit, please have this repeated by your doctor within one month. ---------------

## 2020-10-28 NOTE — ED Provider Notes (Signed)
[facility] EMERGENCY DEPARTMENT Provider Note   CSN: [id number] Arrival date & time: 10/28/20  1216     History Chief Complaint  Patient presents with  . Back Pain    Chelsea Fernandez is a 29 y.o. female.  Chelsea Fernandez is a 29 y.o. female with a history of asthma, renal cell carcinoma, acid reflux and anxiety, who presents to the ED for evaluation of low back pain.  She reports this started after she fell on Christmas Day.  Patient reports she was trying to get on a hover board on Christmas Day she was crouched low to the ground but fell over sideways, and tried to catch herself.  She reports since then she has been having pain that starts in the center of her low back and radiates into both buttocks and radiates down the left leg.  Pain is worse with position change, in particular when she stands up.  No loss of bladder control or saddle anesthesia.  No numbness or weakness in her lower extremities she has been ambulatory but it is painful.  She reports that a few days before Christmas she started feeling a little bloated but is not having any focal abdominal pain, no dysuria or urinary frequency or flank pain.  No fevers or chills, no nausea or vomiting.  Has tried some Motrin intermittently without improvement, has not tried anything else to treat this pain.  No prior back injury.  The history is provided by the patient.       Past Medical History:  Diagnosis Date  . Acid reflux   . Anxiety   . ASCUS of cervix with negative high risk HPV   . Asthma   . CIN II (cervical intraepithelial neoplasia II)   . Family history of adverse reaction to anesthesia    aunt has high tolerance to anesthesia  will not put her tosleep  . Headache    migraine rarely  . History of kidney stones   . right renal ca dx'd 01/2018   Kidney cancer    Patient Active Problem List   Diagnosis Date Noted  . Renal carcinoma, right ([place]) 05/12/2020  . Seasonal allergic rhinitis  03/10/2020  . Renal mass 04/24/2018  . 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 ASCUS with positive high risk HPV cervical 10/04/2016  . Hyperlipidemia 09/05/2016  . Fluctuation of weight 09/04/2016    Past Surgical History:  Procedure Laterality Date  . DILATION AND CURETTAGE OF UTERUS  2012  . NOSE SURGERY    . ROBOTIC ASSITED PARTIAL NEPHRECTOMY Right 04/24/2018   Procedure: ROBOTIC ASSITED NEPHRECTOMY;  Surgeon: Ceasar Mons, MD;  Location: [facility];  Service: Urology;  Laterality: Right;     OB History    Gravida  1   Para  0   Term  0   Preterm  0   AB  1   Living        SAB  1   IAB  0   Ectopic  0   Multiple      Live Births              Family History  Adopted: Yes  Problem Relation Age of Onset  . Hypertension Maternal Aunt   . Hypertension Maternal Uncle   . Cancer Maternal Grandmother         STOMACH     Social History   Tobacco Use  . Smoking status: Never Smoker  .  Smokeless tobacco: Never Used  Vaping Use  . Vaping Use: Never used  Substance Use Topics  . Alcohol use: No  . Drug use: No    Home Medications Prior to Admission medications   Medication Sig Start Date End Date Taking? Authorizing Provider  albuterol (VENTOLIN HFA) 108 (90 Base) MCG/ACT inhaler Inhale 2 puffs into the lungs every 6 (six) hours as needed for wheezing or shortness of breath. 03/02/20   Wendall Mola, NP  fluticasone Asencion Islam) 50 MCG/ACT nasal spray Place 2 sprays into both nostrils daily. 03/02/20   Wendall Mola, NP  guaiFENesin (MUCINEX) 600 MG 12 hr tablet Take 1 tablet (600 mg total) by mouth 2 (two) times daily. Patient not taking: Reported on 05/12/2020 04/05/20   Horald Pollen, MD  ipratropium (ATROVENT) 0.06 % nasal spray Place 2 sprays into both nostrils in the morning and at bedtime. 06/10/20   Kennith Gain, MD  omeprazole (PRILOSEC) 20 MG capsule Take 1 capsule (20 mg total) by mouth daily.  04/05/20   Horald Pollen, MD    Allergies    Other  Review of Systems   Review of Systems  Constitutional: Negative for chills and fever.  HENT: Negative.   Respiratory: Negative for cough and shortness of breath.   Cardiovascular: Negative for chest pain.  Gastrointestinal: Negative for abdominal pain, constipation, diarrhea, nausea and vomiting.  Genitourinary: Negative for dysuria, flank pain, frequency and hematuria.  Musculoskeletal: Positive for back pain. Negative for arthralgias, gait problem, joint swelling, myalgias and neck pain.  Skin: Negative for color change, rash and wound.  Neurological: Negative for weakness and numbness.    Physical Exam Updated Vital Signs BP 125/83   Pulse 73   Temp 99.4 F (37.4 C) (Oral)   Resp 16   Ht 5\' 3"  (1.6 m)   Wt 69.4 kg   SpO2 100%   BMI 27.10 kg/m   Physical Exam Vitals and nursing note reviewed.  Constitutional:      General: She is not in acute distress.    Appearance: Normal appearance. She is well-developed, normal weight and well-nourished. She is not ill-appearing or diaphoretic.  HENT:     Head: Atraumatic.  Eyes:     General:        Right eye: No discharge.        Left eye: No discharge.  Cardiovascular:     Pulses:          Radial pulses are 2+ on the right side and 2+ on the left side.       Dorsalis pedis pulses are 2+ on the right side and 2+ on the left side.       Posterior tibial pulses are 2+ on the right side and 2+ on the left side.  Pulmonary:     Effort: Pulmonary effort is normal. No respiratory distress.  Abdominal:     General: Bowel sounds are normal. There is no distension.     Palpations: Abdomen is soft. There is no mass.     Tenderness: There is no abdominal tenderness. There is no guarding.     Comments: Abdomen soft, nondistended, nontender to palpation in all quadrants without guarding or peritoneal signs, no CVA tenderness bilaterally  Musculoskeletal:     Cervical back:  Neck supple.     Comments: Tenderness to palpation over midline low back that radiates into the buttocks.  Pain made worse with range of motion of the lower extremities, positive straight  leg raise on the left.  No palpable deformity or overlying skin changes  Skin:    General: Skin is warm and dry.     Capillary Refill: Capillary refill takes less than 2 seconds.  Neurological:     Mental Status: She is alert and oriented to person, place, and time.     Comments: Alert, clear speech, following commands. Moving all extremities without difficulty. Bilateral lower extremities with 5/5 strength in proximal and distal muscle groups and with dorsi and plantar flexion. Sensation intact in bilateral lower extremities. Ambulatory with steady gait  Psychiatric:        Mood and Affect: Mood and affect and mood normal.        Behavior: Behavior normal.     ED Results / Procedures / Treatments   Labs (all labs ordered are listed, but only abnormal results are displayed) Labs Reviewed  POC URINE PREG, ED    EKG None  Radiology DG Lumbar Spine Complete  Result Date: 10/28/2020 CLINICAL DATA:  Lumbosacral back pain radiating to the right side of the back and down to the left foot. Recent fall. EXAM: LUMBAR SPINE - COMPLETE 4+ VIEW COMPARISON:  None. FINDINGS: Hemi transitional lumbosacral anatomy with enlarged left transverse process and pseudoarticulation with the sacrum. Slight straightening of normal lordosis. No listhesis. No visualized pars defects. Vertebral body heights are normal. There is no listhesis. The posterior elements are intact. Disc spaces are preserved. No fracture. Sacroiliac joints are symmetric and normal. Surgical clips in the right abdomen. IMPRESSION: No acute fracture or subluxation of the lumbar spine. Electronically Signed   By: Narda Rutherford M.D.   On: 10/28/2020 15:27    Procedures Procedures (including critical care time)  Medications Ordered in  ED Medications  lidocaine (LIDODERM) 5 % 1 patch (1 patch Transdermal Patch Applied 10/28/20 1521)  ketorolac (TORADOL) 30 MG/ML injection 30 mg (30 mg Intramuscular Given 10/28/20 1521)  methocarbamol (ROBAXIN) tablet 500 mg (500 mg Oral Given 10/28/20 1521)    ED Course  I have reviewed the triage vital signs and the nursing notes.  Pertinent labs & imaging results that were available during my care of the patient were reviewed by me and considered in my medical decision making (see chart for details).    MDM Rules/Calculators/A&P                          Normal neurological exam, no evidence of urinary incontinence or retention, pain is consistently reproducible. There is no evidence of AAA or concern for dissection at this time.   Patient can walk but states is painful.  No loss of bowel or bladder control.  No concern for cauda equina.  No fever, night sweats, weight loss, h/o cancer, IVDU.  No evidence of fracture or other acute bony abnormality on x-rays, there is some straightening of the lumbar lordosis that may suggest some muscle spasm.  Pain treated here in the department with adequate improvement. RICE protocol and pain medicine indicated and discussed with patient. I have also discussed reasons to return immediately to the ER.  Patient expresses understanding and agrees with plan.  Final Clinical Impression(s) / ED Diagnoses Final diagnoses:  Acute midline low back pain with left-sided sciatica    Rx / DC Orders ED Discharge Orders         Ordered    naproxen (NAPROSYN) 500 MG tablet  2 times daily  10/28/20 1635    methocarbamol (ROBAXIN) 500 MG tablet  2 times daily        10/28/20 1635           Jacqlyn Larsen, [place] 10/28/20 1635    Blanchie Dessert, MD 10/28/20 1640

## 2020-11-09 ENCOUNTER — Ambulatory Visit: Payer: Self-pay | Admitting: Emergency Medicine

## 2020-11-15 ENCOUNTER — Telehealth (INDEPENDENT_AMBULATORY_CARE_PROVIDER_SITE_OTHER): Payer: Self-pay | Admitting: Emergency Medicine

## 2020-11-15 ENCOUNTER — Encounter: Payer: Self-pay | Admitting: Emergency Medicine

## 2020-11-15 VITALS — Ht 63.0 in

## 2020-11-15 DIAGNOSIS — K219 Gastro-esophageal reflux disease without esophagitis: Secondary | ICD-10-CM

## 2020-11-15 DIAGNOSIS — R12 Heartburn: Secondary | ICD-10-CM

## 2020-11-15 DIAGNOSIS — C641 Malignant neoplasm of right kidney, except renal pelvis: Secondary | ICD-10-CM

## 2020-11-15 DIAGNOSIS — Z85528 Personal history of other malignant neoplasm of kidney: Secondary | ICD-10-CM

## 2020-11-15 DIAGNOSIS — R87610 Atypical squamous cells of undetermined significance on cytologic smear of cervix (ASC-US): Secondary | ICD-10-CM

## 2020-11-15 DIAGNOSIS — R87810 Cervical high risk human papillomavirus (HPV) DNA test positive: Secondary | ICD-10-CM

## 2020-11-15 NOTE — Patient Instructions (Signed)
° ° ° °  If you have lab work done today you will be contacted with your lab results within the next 2 weeks.  If you have not heard from us then please contact us. The fastest way to get your results is to register for My Chart. ° ° °IF you received an x-ray today, you will receive an invoice from [facility]. Please contact [facility] at [phone] with questions or concerns regarding your invoice.  ° °IF you received labwork today, you will receive an invoice from [facility]. Please contact [facility] at [phone] with questions or concerns regarding your invoice.  ° °Our billing staff will not be able to assist you with questions regarding bills from these companies. ° °You will be contacted with the lab results as soon as they are available. The fastest way to get your results is to activate your My Chart account. Instructions are located on the last page of this paperwork. If you have not heard from us regarding the results in 2 weeks, please contact this office. °  ° ° ° °

## 2020-11-15 NOTE — Progress Notes (Signed)
Telemedicine Encounter- SOAP NOTE Established Patient Patient: Home  Provider: Office      This telephone encounter was conducted with the patient's (or proxy's) verbal consent via audio telecommunications: yes Patient was instructed to have this encounter in a suitably private space; and to only have persons present to whom they give permission to participate. In addition, patient identity was confirmed by use of name plus two identifiers (DOB and address).  I discussed the limitations, risks, security and privacy concerns of performing an evaluation and management service by telephone and the availability of in person appointments. I also discussed with the patient that there may be a patient responsible charge related to this service. The patient expressed understanding and agreed to proceed.  I spent a total of 20 minutes talking with the patient or their proxy.  Chief Complaint  Patient presents with  . Medical Management of Chronic Issues    6 f/u    . GI Problem    After eating she feels like the food is digesting as well as it usually. Going on since the last time with the acid reflux    Subjective   Chelsea Fernandez is a 30 y.o. female established patient. Telephone visit today for follow-up of GERD with chronic heartburn.  Seen by me last July and started on omeprazole with good results.  Off medication now and symptoms intermittent.  Also has what sounds like LPR symptoms.  Symptoms triggered by certain foods like spicy foods and chocolate.  No nausea or vomiting.  No hematemesis or melena.  Has been taking naproxen with muscle relaxant lately. Has history of kidney cancer with history of right nephrectomy. Has history of COVID infection November 2020.  No vaccination yet. Occasionally feels bloated. Has never seen a GI doctor for upper endoscopy. Has no other complaints or medical concerns today.  HPI   Patient Active Problem List   Diagnosis Date Noted  . Renal  carcinoma, right ([place]) 05/12/2020  . Seasonal allergic rhinitis 03/10/2020  . Renal mass 04/24/2018  . 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 ASCUS with positive high risk HPV cervical 10/04/2016  . Hyperlipidemia 09/05/2016  . Fluctuation of weight 09/04/2016    Past Medical History:  Diagnosis Date  . Acid reflux   . Anxiety   . ASCUS of cervix with negative high risk HPV   . Asthma   . CIN II (cervical intraepithelial neoplasia II)   . Family history of adverse reaction to anesthesia    aunt has high tolerance to anesthesia  will not put her tosleep  . Headache    migraine rarely  . History of kidney stones   . right renal ca dx'd 01/2018   Kidney cancer    Current Outpatient Medications  Medication Sig Dispense Refill  . albuterol (VENTOLIN HFA) 108 (90 Base) MCG/ACT inhaler Inhale 2 puffs into the lungs every 6 (six) hours as needed for wheezing or shortness of breath. 8 g 3  . ipratropium (ATROVENT) 0.06 % nasal spray Place 2 sprays into both nostrils in the morning and at bedtime. 15 mL 5   No current facility-administered medications for this visit.    Allergies  Allergen Reactions  . Other     Almond Butter    Social History   Socioeconomic History  . Marital status: Single    Spouse name: Not on file  . Number of children: Not on file  . Years of education: Not on file  .  Highest education level: Not on file  Occupational History  . Not on file  Tobacco Use  . Smoking status: Never Smoker  . Smokeless tobacco: Never Used  Vaping Use  . Vaping Use: Never used  Substance and Sexual Activity  . Alcohol use: No  . Drug use: No  . Sexual activity: Not Currently    Comment: 1ST INTERCOURSE- 17, Fewer than 5 partners  Other Topics Concern  . Not on file  Social History Narrative   ** Merged History Encounter **       Social Determinants of Health   Financial Resource Strain: Not on file  Food Insecurity: Not on file  Transportation  Needs: Not on file  Physical Activity: Not on file  Stress: Not on file  Social Connections: Not on file  Intimate Partner Violence: Not on file    Review of Systems  Constitutional: Negative.  Negative for chills and fever.  HENT: Negative.  Negative for congestion and sore throat.   Respiratory: Negative.  Negative for cough and shortness of breath.   Cardiovascular: Negative.  Negative for chest pain and palpitations.  Gastrointestinal: Positive for heartburn. Negative for abdominal pain, blood in stool, diarrhea, melena, nausea and vomiting.  Genitourinary: Negative.  Negative for dysuria and hematuria.  Musculoskeletal: Negative.  Negative for back pain, joint pain and myalgias.  Skin: Negative.  Negative for rash.  Neurological: Negative.  Negative for dizziness and headaches.  All other systems reviewed and are negative.   Objective  Alert and oriented x3 in no apparent respiratory distress Vitals as reported by the patient: Today's Vitals   11/15/20 0840  Height: 5\' 3"  (1.6 m)    There are no diagnoses linked to this encounter.  Chelsea Fernandez was seen today for medical management of chronic issues and gi problem.  Diagnoses and all orders for this visit:  Chronic heartburn -     Ambulatory referral to Gastroenterology  Laryngopharyngeal reflux -     Ambulatory referral to Gastroenterology  History of kidney cancer  Renal carcinoma, right ([place])  ASCUS with positive high risk HPV cervical    Clinically stable.  No red flag signs or symptoms.  However needs GI referral for possible upper endoscopy. Diet and nutrition to avoid GERD discussed with patient.  Advised to start omeprazole daily. Advised to get COVID vaccination. Will need office visit after GI evaluation.  I discussed the assessment and treatment plan with the patient. The patient was provided an opportunity to ask questions and all were answered. The patient agreed with the plan and demonstrated an  understanding of the instructions.   The patient was advised to call back or seek an in-person evaluation if the symptoms worsen or if the condition fails to improve as anticipated.  I provided 20 minutes of non-face-to-face time during this encounter.  Horald Pollen, MD  Primary Care at [facility]

## 2020-12-10 ENCOUNTER — Encounter: Payer: Self-pay | Admitting: Family Medicine

## 2020-12-10 ENCOUNTER — Other Ambulatory Visit: Payer: Self-pay

## 2020-12-10 ENCOUNTER — Ambulatory Visit (INDEPENDENT_AMBULATORY_CARE_PROVIDER_SITE_OTHER): Payer: Self-pay | Admitting: Family Medicine

## 2020-12-10 VITALS — BP 122/80 | HR 87 | Temp 98.6°F | Ht 63.0 in | Wt 152.4 lb

## 2020-12-10 DIAGNOSIS — R140 Abdominal distension (gaseous): Secondary | ICD-10-CM

## 2020-12-10 DIAGNOSIS — R12 Heartburn: Secondary | ICD-10-CM

## 2020-12-10 DIAGNOSIS — Z8709 Personal history of other diseases of the respiratory system: Secondary | ICD-10-CM

## 2020-12-10 DIAGNOSIS — K5909 Other constipation: Secondary | ICD-10-CM

## 2020-12-10 DIAGNOSIS — Z85528 Personal history of other malignant neoplasm of kidney: Secondary | ICD-10-CM

## 2020-12-10 MED ORDER — PANTOPRAZOLE SODIUM 40 MG PO TBEC: DELAYED_RELEASE_TABLET | ORAL | 1 refills | Status: DC

## 2020-12-10 MED ORDER — MONTELUKAST SODIUM 10 MG PO TABS: 10.0000 mg | ORAL_TABLET | Freq: Every day | ORAL | 1 refills | Status: DC

## 2020-12-10 MED ORDER — ALBUTEROL SULFATE HFA 108 (90 BASE) MCG/ACT IN AERS: 2.0000 | INHALATION_SPRAY | Freq: Four times a day (QID) | RESPIRATORY_TRACT | 3 refills | Status: DC | PRN

## 2020-12-10 NOTE — Progress Notes (Signed)
Patient ID: Chelsea Fernandez, female    DOB: December 06, 1990  Age: 30 y.o. MRN: [id number]  Chief Complaint  Patient presents with  . Stress  . Abdominal Pain    As soon as she eats she is bloated and she has burn in tummy- acid reflux  . wants a different ihaler     Subjective:   Patient is here complaining of increased abdominal pain and bloating.  She has a history of reflux.  She has had a nephrectomy for kidney cancer.  She has chronic constipation and passes small amounts several times a day which she has to strain that.  Today she saw a spot of blood when she is straining her stool.  She is not having any nausea or vomiting.  Actually she had a little nausea today.  She has not taking any laxatives.  She had a CT scan of her abdomen back in the fall for follow-up of her kidney cancer and it showed the gallbladder to look okay.  She has a little spot on her liver which appears to be of benign area.  She has a history of springtime allergic breathing difficulty and asthma.  She complains of the inhaler causing too much jitteriness.  We discussed treatment options.  She does not seem to have consistent enough problems to be worth putting her on long-term steroid inhaler.  She agreed to trying some Singulair.  She does have anxiety related to the cancer which is a natural thing.  Current allergies, medications, problem list, past/family and social histories reviewed.  Objective:  BP 122/80   Pulse 87   Temp 98.6 F (37 C) (Temporal)   Ht 5\' 3"  (1.6 m)   Wt 152 lb 6.4 oz (69.1 kg)   LMP 12/08/2020   SpO2 100%   BMI 27.00 kg/m   Somewhat overweight young lady in no acute distress.  No CVA tenderness.  Chest clear.  Heart rate without murmurs.  Abdomen has normal bowel sounds.  A little bit full feeling.  Slightly tympanic.  Soft without masses.  Assessment & Plan:   Assessment: 1. Abdominal bloating   2. Chronic heartburn   3. History of kidney cancer   4. History of asthma   5.  Chronic constipation       Plan: See instructions.  Went over things in detail with her.  Orders Placed This Encounter  Procedures  . US Abdomen Limited RUQ (LIVER/GB)    Patient has a history of having had a kidney stone removed.  Also she has a little benign appearing spot on her liver on the CT scan.    Standing Status:   Future    Standing Expiration Date:   12/10/2021    Order Specific Question:   Reason for Exam (SYMPTOM  OR DIAGNOSIS REQUIRED)    Answer:   abdominal pain and bloating    Order Specific Question:   Preferred imaging location?    Answer:   [id number]    Order Specific Question:   Call Results- Best Contact Number?    Answer:   [facility] [address] Macho [phone]    Meds ordered this encounter  Medications  . albuterol (VENTOLIN HFA) 108 (90 Base) MCG/ACT inhaler    Sig: Inhale 2 puffs into the lungs every 6 (six) hours as needed for wheezing or shortness of breath.    Dispense:  8 g    Refill:  3  . pantoprazole (PROTONIX) 40 MG tablet  Sig: Take 1 daily as needed for reflux.    Dispense:  30 tablet    Refill:  1  . montelukast (SINGULAIR) 10 MG tablet    Sig: Take 1 tablet (10 mg total) by mouth at bedtime.    Dispense:  30 tablet    Refill:  1         Patient Instructions    I believe that your symptoms are being caused primarily by chronic constipation.  This causes the trapping of gas in the gut.  The increased pressure causes increased reflux.  It also can contribute to your back pains.  The history of having had cancer give you added anxiety and concern when you do have pain, and that probably aggravates your symptoms.  I encourage you to drink plenty of fluids.  Plain water is your best drink.  Also try to get regular exercise by doing some regular walking which helps the bowels to move better.  Take MiraLAX 1 dose daily.  This can be purchased over-the-counter at the pharmacy.  After a few days I would hope that  your bowels would start moving more regularly.  If they get too loose cut back to just half a dose every day or 1 dose every other day.  Titrate the dose as needed.  If despite the MiraLAX you did not seem to be having looser bowel movements, take Dulcolax 1 to 2 tablets daily as needed.  The goal is to get your bowels moving so they are fairly soft.  We are scheduling you for a gallbladder ultrasound.  Even though the CT scan did not show anything abnormal with the gallbladder, the ultrasound is probably the best diagnostic test for the gallbladder.  Plan to keep your appointment with the gastroenterologist in late March.  You may end up needing to have an upper endoscopy or even a lower endoscopy also.  Continue using your inhaler when necessary for breathing.  I am also giving you a prescription for Singulair (montelukast) which you can take 1 each evening during allergy seasons.  That should keep you from having to use the inhaler as often, since the inhaler causes you to be more jittery.  I am prescribing pantoprazole for you to take 40 mg 1 daily as needed for heartburn and reflux.  In the event of acute abdominal pain, GI bleeding, fever, or other issues go to the emergency room if necessary.  I will no longer be at this practice, so if you do not hear from the report on the gallbladder ultrasound within 2 or 3 days of it being done call out here and ask somebody to check on the report for you.  Follow-up with Dr. Mitchel Honour as needed.  I will not be here.   If you have lab work done today you will be contacted with your lab results within the next 2 weeks.  If you have not heard from [place] then please contact us. The fastest way to get your results is to register for My Chart.   IF you received an x-ray today, you will receive an invoice from [facility]. Please contact [facility] at [phone] with questions or concerns regarding your invoice.   IF you received  labwork today, you will receive an invoice from [place]. Please contact [facility] at [phone] with questions or concerns regarding your invoice.   Our billing staff will not be able to assist you with questions regarding bills from these companies.  You will be  contacted with the lab results as soon as they are available. The fastest way to get your results is to activate your My Chart account. Instructions are located on the last page of this paperwork. If you have not heard from [place] regarding the results in 2 weeks, please contact this office.         Return if symptoms worsen or fail to improve.   Ruben Reason, MD 12/10/2020

## 2020-12-10 NOTE — Patient Instructions (Addendum)
I believe that your symptoms are being caused primarily by chronic constipation.  This causes the trapping of gas in the gut.  The increased pressure causes increased reflux.  It also can contribute to your back pains.  The history of having had cancer give you added anxiety and concern when you do have pain, and that probably aggravates your symptoms.  I encourage you to drink plenty of fluids.  Plain water is your best drink.  Also try to get regular exercise by doing some regular walking which helps the bowels to move better.  Take MiraLAX 1 dose daily.  This can be purchased over-the-counter at the pharmacy.  After a few days I would hope that your bowels would start moving more regularly.  If they get too loose cut back to just half a dose every day or 1 dose every other day.  Titrate the dose as needed.  If despite the MiraLAX you did not seem to be having looser bowel movements, take Dulcolax 1 to 2 tablets daily as needed.  The goal is to get your bowels moving so they are fairly soft.  We are scheduling you for a gallbladder ultrasound.  Even though the CT scan did not show anything abnormal with the gallbladder, the ultrasound is probably the best diagnostic test for the gallbladder.  Plan to keep your appointment with the gastroenterologist in late March.  You may end up needing to have an upper endoscopy or even a lower endoscopy also.  Continue using your inhaler when necessary for breathing.  I am also giving you a prescription for Singulair (montelukast) which you can take 1 each evening during allergy seasons.  That should keep you from having to use the inhaler as often, since the inhaler causes you to be more jittery.  I am prescribing pantoprazole for you to take 40 mg 1 daily as needed for heartburn and reflux.  In the event of acute abdominal pain, GI bleeding, fever, or other issues go to the emergency room if necessary.  I will no longer be at this practice, so if you do  not hear from the report on the gallbladder ultrasound within 2 or 3 days of it being done call out here and ask somebody to check on the report for you.  Follow-up with Dr. Mitchel Honour as needed.  I will not be here.   If you have lab work done today you will be contacted with your lab results within the next 2 weeks.  If you have not heard from [place] then please contact us. The fastest way to get your results is to register for My Chart.   IF you received an x-ray today, you will receive an invoice from [facility]. Please contact [facility] at [phone] with questions or concerns regarding your invoice.   IF you received labwork today, you will receive an invoice from [place]. Please contact [facility] at [phone] with questions or concerns regarding your invoice.   Our billing staff will not be able to assist you with questions regarding bills from these companies.  You will be contacted with the lab results as soon as they are available. The fastest way to get your results is to activate your My Chart account. Instructions are located on the last page of this paperwork. If you have not heard from [place] regarding the results in 2 weeks, please contact this office.

## 2020-12-17 ENCOUNTER — Ambulatory Visit
Admission: RE | Admit: 2020-12-17 | Discharge: 2020-12-17 | Disposition: A | Discharge status: Home / Self Care | Payer: Self-pay | Source: Ambulatory Visit | Attending: Family Medicine | Admitting: Family Medicine

## 2020-12-17 DIAGNOSIS — R140 Abdominal distension (gaseous): Secondary | ICD-10-CM

## 2020-12-23 ENCOUNTER — Telehealth: Payer: Self-pay | Admitting: Emergency Medicine

## 2020-12-23 NOTE — Telephone Encounter (Signed)
Patient called to get results of Ultrasound. Patient stated that the facility where the ultrasound was performed is closing.  I sent the patient a link to sign up for MyChart this morning. Patient would like to discuss results with provider when they become available.  Please advise at [phone]

## 2020-12-23 NOTE — Telephone Encounter (Signed)
This ultrasound was ordered by Dr. Linna Darner.  Not familiar with clinical picture and or reason for the study.  Ultrasound was read as normal but she may need appointment with me to further discuss her symptoms.  Next week appointment would be appropriate.

## 2020-12-24 NOTE — Telephone Encounter (Signed)
Spoke with patient and scheduled first available appointment to further discuss symptoms.

## 2021-01-04 ENCOUNTER — Encounter: Payer: Self-pay | Admitting: Emergency Medicine

## 2021-01-04 ENCOUNTER — Other Ambulatory Visit: Payer: Self-pay

## 2021-01-04 ENCOUNTER — Ambulatory Visit (INDEPENDENT_AMBULATORY_CARE_PROVIDER_SITE_OTHER): Payer: Self-pay | Admitting: Emergency Medicine

## 2021-01-04 VITALS — BP 113/80 | HR 74 | Temp 98.7°F | Resp 16 | Ht 60.0 in | Wt 151.0 lb

## 2021-01-04 DIAGNOSIS — R12 Heartburn: Secondary | ICD-10-CM

## 2021-01-04 DIAGNOSIS — F419 Anxiety disorder, unspecified: Secondary | ICD-10-CM

## 2021-01-04 DIAGNOSIS — C641 Malignant neoplasm of right kidney, except renal pelvis: Secondary | ICD-10-CM

## 2021-01-04 DIAGNOSIS — Z85528 Personal history of other malignant neoplasm of kidney: Secondary | ICD-10-CM

## 2021-01-04 DIAGNOSIS — Z905 Acquired absence of kidney: Secondary | ICD-10-CM

## 2021-01-04 DIAGNOSIS — K5909 Other constipation: Secondary | ICD-10-CM

## 2021-01-04 NOTE — Progress Notes (Signed)
Chelsea Fernandez 30 y.o.   Chief Complaint  Patient presents with  . chronic heartburn    Per patient follow up Ultrasound results done this month     HISTORY OF PRESENT ILLNESS: This is a 30 y.o. female here for follow-up on 12/10/2020 visit with Dr. Linna Darner when she was seen complaining of abdominal bloating. Abdominal ultrasound limited to right upper quadrant done on 12/17/2020 and read as normal. Patient's symptoms are improved. Has history of chronic heartburn and chronic constipation exacerbated by certain foods. Also has history of chronic anxiety requesting referral to psychologist. Has history of right renal cancer status post nephrectomy. No other complaints or medical concerns today.  HPI   Prior to Admission medications   Medication Sig Start Date End Date Taking? Authorizing Provider  albuterol (VENTOLIN HFA) 108 (90 Base) MCG/ACT inhaler Inhale 2 puffs into the lungs every 6 (six) hours as needed for wheezing or shortness of breath. 12/10/20  Yes Posey Boyer, MD  ipratropium (ATROVENT) 0.06 % nasal spray Place 2 sprays into both nostrils in the morning and at bedtime. 06/10/20  Yes Padgett, Rae Halsted, MD  pantoprazole (PROTONIX) 40 MG tablet Take 1 daily as needed for reflux. 12/10/20  Yes Posey Boyer, MD  montelukast (SINGULAIR) 10 MG tablet Take 1 tablet (10 mg total) by mouth at bedtime. Patient not taking: Reported on 01/04/2021 12/10/20   Posey Boyer, MD    Allergies  Allergen Reactions  . Other     Almond Butter    Patient Active Problem List   Diagnosis Date Noted  . Chronic heartburn 01/04/2021  . Renal carcinoma, right ([place]) 05/12/2020  . Seasonal allergic rhinitis 03/10/2020  . Renal mass 04/24/2018  . 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 ASCUS with positive high risk HPV cervical 10/04/2016  . Hyperlipidemia 09/05/2016  . Fluctuation of weight 09/04/2016    Past Medical History:  Diagnosis Date  . Acid reflux   .  Anxiety   . ASCUS of cervix with negative high risk HPV   . Asthma   . CIN II (cervical intraepithelial neoplasia II)   . Family history of adverse reaction to anesthesia    aunt has high tolerance to anesthesia  will not put her tosleep  . Headache    migraine rarely  . History of kidney stones   . right renal ca dx'd 01/2018   Kidney cancer    Past Surgical History:  Procedure Laterality Date  . DILATION AND CURETTAGE OF UTERUS  2012  . NOSE SURGERY    . ROBOTIC ASSITED PARTIAL NEPHRECTOMY Right 04/24/2018   Procedure: ROBOTIC ASSITED NEPHRECTOMY;  Surgeon: Ceasar Mons, MD;  Location: [facility];  Service: Urology;  Laterality: Right;    Social History   Socioeconomic History  . Marital status: Single    Spouse name: Not on file  . Number of children: Not on file  . Years of education: Not on file  . Highest education level: Not on file  Occupational History  . Not on file  Tobacco Use  . Smoking status: Never Smoker  . Smokeless tobacco: Never Used  Vaping Use  . Vaping Use: Never used  Substance and Sexual Activity  . Alcohol use: No  . Drug use: No  . Sexual activity: Not Currently    Comment: 1ST INTERCOURSE- 17, Fewer than 5 partners  Other Topics Concern  . Not on file  Social History Narrative   ** Merged History  Encounter **       Social Determinants of Health   Financial Resource Strain: Not on file  Food Insecurity: Not on file  Transportation Needs: Not on file  Physical Activity: Not on file  Stress: Not on file  Social Connections: Not on file  Intimate Partner Violence: Not on file    Family History  Adopted: Yes  Problem Relation Age of Onset  . Hypertension Maternal Aunt   . Hypertension Maternal Uncle   . Cancer Maternal Grandmother         STOMACH      Review of Systems  Constitutional: Negative.  Negative for chills and fever.  HENT: Negative.  Negative for congestion and sore throat.   Respiratory: Negative.   Negative for cough and shortness of breath.   Cardiovascular: Negative.  Negative for chest pain and palpitations.  Gastrointestinal: Positive for constipation and heartburn. Negative for abdominal pain, nausea and vomiting.       Occasionally sees blood on tissue paper after bowel movement  Genitourinary: Negative.  Negative for dysuria and hematuria.  Musculoskeletal: Negative.  Negative for myalgias and neck pain.  Skin: Negative.  Negative for rash.  Neurological: Negative.  Negative for dizziness and headaches.  All other systems reviewed and are negative.    Today's Vitals   01/04/21 1101  BP: 113/80  Pulse: 74  Resp: 16  Temp: 98.7 F (37.1 C)  TempSrc: Temporal  SpO2: 96%  Weight: 151 lb (68.5 kg)  Height: 5' (1.524 m)   Body mass index is 29.49 kg/m.   Physical Exam Vitals reviewed.  Constitutional:      Appearance: Normal appearance.  HENT:     Head: Normocephalic.  Eyes:     Extraocular Movements: Extraocular movements intact.     Pupils: Pupils are equal, round, and reactive to light.  Cardiovascular:     Rate and Rhythm: Normal rate and regular rhythm.     Pulses: Normal pulses.     Heart sounds: Normal heart sounds.  Pulmonary:     Effort: Pulmonary effort is normal.     Breath sounds: Normal breath sounds.  Abdominal:     General: Bowel sounds are normal. There is no distension.     Palpations: Abdomen is soft.     Tenderness: There is no abdominal tenderness.  Musculoskeletal:        General: Normal range of motion.     Cervical back: Normal range of motion and neck supple.  Skin:    General: Skin is warm and dry.     Capillary Refill: Capillary refill takes less than 2 seconds.  Neurological:     General: No focal deficit present.     Mental Status: She is alert and oriented to person, place, and time.  Psychiatric:        Mood and Affect: Mood normal.        Behavior: Behavior normal.      ASSESSMENT & PLAN: Olayinka was seen today for  chronic heartburn.  Diagnoses and all orders for this visit:  Chronic heartburn  History of kidney cancer  History of nephrectomy, right  Chronic constipation  Chronic anxiety -     Ambulatory referral to Psychology  Malignant neoplasm of right kidney, except renal pelvis [facility])    Patient Instructions       If you have lab work done today you will be contacted with your lab results within the next 2 weeks.  If you have not heard  from [place] then please contact us. The fastest way to get your results is to register for My Chart.   IF you received an x-ray today, you will receive an invoice from [facility]. Please contact [facility] at [phone] with questions or concerns regarding your invoice.   IF you received labwork today, you will receive an invoice from [facility]. Please contact [facility] at [phone] with questions or concerns regarding your invoice.   Our billing staff will not be able to assist you with questions regarding bills from these companies.  You will be contacted with the lab results as soon as they are available. The fastest way to get your results is to activate your My Chart account. Instructions are located on the last page of this paperwork. If you have not heard from [place] regarding the results in 2 weeks, please contact this office.     Health Maintenance, Female Adopting a healthy lifestyle and getting preventive care are important in promoting health and wellness. Ask your health care provider about:  The right schedule for you to have regular tests and exams.  Things you can do on your own to prevent diseases and keep yourself healthy. What should I know about diet, weight, and exercise? Eat a healthy diet  Eat a diet that includes plenty of vegetables, fruits, low-fat dairy products, and lean protein.  Do not eat a lot of foods that are high in solid fats, added sugars, or sodium.   Maintain a healthy weight Body mass  index (BMI) is used to identify weight problems. It estimates body fat based on height and weight. Your health care provider can help determine your BMI and help you achieve or maintain a healthy weight. Get regular exercise Get regular exercise. This is one of the most important things you can do for your health. Most adults should:  Exercise for at least 150 minutes each week. The exercise should increase your heart rate and make you sweat (moderate-intensity exercise).  Do strengthening exercises at least twice a week. This is in addition to the moderate-intensity exercise.  Spend less time sitting. Even light physical activity can be beneficial. Watch cholesterol and blood lipids Have your blood tested for lipids and cholesterol at 31 years of age, then have this test every 5 years. Have your cholesterol levels checked more often if:  Your lipid or cholesterol levels are high.  You are older than 31 years of age.  You are at high risk for heart disease. What should I know about cancer screening? Depending on your health history and family history, you may need to have cancer screening at various ages. This may include screening for:  Breast cancer.  Cervical cancer.  Colorectal cancer.  Skin cancer.  Lung cancer. What should I know about heart disease, diabetes, and high blood pressure? Blood pressure and heart disease  High blood pressure causes heart disease and increases the risk of stroke. This is more likely to develop in people who have high blood pressure readings, are of African descent, or are overweight.  Have your blood pressure checked: ? Every 3-5 years if you are 36-48 years of age. ? Every year if you are 66 years old or older. Diabetes Have regular diabetes screenings. This checks your fasting blood sugar level. Have the screening done:  Once every three years after age 16 if you are at a normal weight and have a low risk for diabetes.  More often and at  a younger age if  you are overweight or have a high risk for diabetes. What should I know about preventing infection? Hepatitis B If you have a higher risk for hepatitis B, you should be screened for this virus. Talk with your health care provider to find out if you are at risk for hepatitis B infection. Hepatitis C Testing is recommended for:  Everyone born from [birth year] through 1965.  Anyone with known risk factors for hepatitis C. Sexually transmitted infections (STIs)  Get screened for STIs, including gonorrhea and chlamydia, if: ? You are sexually active and are younger than 31 years of age. ? You are older than 31 years of age and your health care provider tells you that you are at risk for this type of infection. ? Your sexual activity has changed since you were last screened, and you are at increased risk for chlamydia or gonorrhea. Ask your health care provider if you are at risk.  Ask your health care provider about whether you are at high risk for HIV. Your health care provider may recommend a prescription medicine to help prevent HIV infection. If you choose to take medicine to prevent HIV, you should first get tested for HIV. You should then be tested every 3 months for as long as you are taking the medicine. Pregnancy  If you are about to stop having your period (premenopausal) and you may become pregnant, seek counseling before you get pregnant.  Take 400 to 800 micrograms (mcg) of folic acid every day if you become pregnant.  Ask for birth control (contraception) if you want to prevent pregnancy. Osteoporosis and menopause Osteoporosis is a disease in which the bones lose minerals and strength with aging. This can result in bone fractures. If you are 80 years old or older, or if you are at risk for osteoporosis and fractures, ask your health care provider if you should:  Be screened for bone loss.  Take a calcium or vitamin D supplement to lower your risk of fractures.  Be  given hormone replacement therapy (HRT) to treat symptoms of menopause. Follow these instructions at home: Lifestyle  Do not use any products that contain nicotine or tobacco, such as cigarettes, e-cigarettes, and chewing tobacco. If you need help quitting, ask your health care provider.  Do not use street drugs.  Do not share needles.  Ask your health care provider for help if you need support or information about quitting drugs. Alcohol use  Do not drink alcohol if: ? Your health care provider tells you not to drink. ? You are pregnant, may be pregnant, or are planning to become pregnant.  If you drink alcohol: ? Limit how much you use to 0-1 drink a day. ? Limit intake if you are breastfeeding.  Be aware of how much alcohol is in your drink. In the [place] one drink equals one 12 oz bottle of beer (355 mL), one 5 oz glass of wine (148 mL), or one 1 oz glass of hard liquor (44 mL). General instructions  Schedule regular health, dental, and eye exams.  Stay current with your vaccines.  Tell your health care provider if: ? You often feel depressed. ? You have ever been abused or do not feel safe at home. Summary  Adopting a healthy lifestyle and getting preventive care are important in promoting health and wellness.  Follow your health care provider's instructions about healthy diet, exercising, and getting tested or screened for diseases.  Follow your health care provider's instructions on monitoring your cholesterol  and blood pressure. This information is not intended to replace advice given to you by your health care provider. Make sure you discuss any questions you have with your health care provider. Document Revised: 10/09/2018 Document Reviewed: 10/09/2018 Elsevier Patient Education  2021 Elsevier Inc.      Agustina Caroli, MD [facility]

## 2021-01-04 NOTE — Patient Instructions (Addendum)
   If you have lab work done today you will be contacted with your lab results within the next 2 weeks.  If you have not heard from us then please contact us. The fastest way to get your results is to register for My Chart.   IF you received an x-ray today, you will receive an invoice from [facility]. Please contact [facility] at [phone] with questions or concerns regarding your invoice.   IF you received labwork today, you will receive an invoice from [facility]. Please contact [facility] at [phone] with questions or concerns regarding your invoice.   Our billing staff will not be able to assist you with questions regarding bills from these companies.  You will be contacted with the lab results as soon as they are available. The fastest way to get your results is to activate your My Chart account. Instructions are located on the last page of this paperwork. If you have not heard from us regarding the results in 2 weeks, please contact this office.     Health Maintenance, Female Adopting a healthy lifestyle and getting preventive care are important in promoting health and wellness. Ask your health care provider about:  The right schedule for you to have regular tests and exams.  Things you can do on your own to prevent diseases and keep yourself healthy. What should I know about diet, weight, and exercise? Eat a healthy diet  Eat a diet that includes plenty of vegetables, fruits, low-fat dairy products, and lean protein.  Do not eat a lot of foods that are high in solid fats, added sugars, or sodium.   Maintain a healthy weight Body mass index (BMI) is used to identify weight problems. It estimates body fat based on height and weight. Your health care provider can help determine your BMI and help you achieve or maintain a healthy weight. Get regular exercise Get regular exercise. This is one of the most important things you can do for your health. Most  adults should:  Exercise for at least 150 minutes each week. The exercise should increase your heart rate and make you sweat (moderate-intensity exercise).  Do strengthening exercises at least twice a week. This is in addition to the moderate-intensity exercise.  Spend less time sitting. Even light physical activity can be beneficial. Watch cholesterol and blood lipids Have your blood tested for lipids and cholesterol at 31 years of age, then have this test every 5 years. Have your cholesterol levels checked more often if:  Your lipid or cholesterol levels are high.  You are older than 31 years of age.  You are at high risk for heart disease. What should I know about cancer screening? Depending on your health history and family history, you may need to have cancer screening at various ages. This may include screening for:  Breast cancer.  Cervical cancer.  Colorectal cancer.  Skin cancer.  Lung cancer. What should I know about heart disease, diabetes, and high blood pressure? Blood pressure and heart disease  High blood pressure causes heart disease and increases the risk of stroke. This is more likely to develop in people who have high blood pressure readings, are of African descent, or are overweight.  Have your blood pressure checked: ? Every 3-5 years if you are 18-39 years of age. ? Every year if you are 40 years old or older. Diabetes Have regular diabetes screenings. This checks your fasting blood sugar level. Have the screening done:  Once every   three years after age 40 if you are at a normal weight and have a low risk for diabetes.  More often and at a younger age if you are overweight or have a high risk for diabetes. What should I know about preventing infection? Hepatitis B If you have a higher risk for hepatitis B, you should be screened for this virus. Talk with your health care provider to find out if you are at risk for hepatitis B infection. Hepatitis  C Testing is recommended for:  Everyone born from 1945 through 1965.  Anyone with known risk factors for hepatitis C. Sexually transmitted infections (STIs)  Get screened for STIs, including gonorrhea and chlamydia, if: ? You are sexually active and are younger than 31 years of age. ? You are older than 31 years of age and your health care provider tells you that you are at risk for this type of infection. ? Your sexual activity has changed since you were last screened, and you are at increased risk for chlamydia or gonorrhea. Ask your health care provider if you are at risk.  Ask your health care provider about whether you are at high risk for HIV. Your health care provider may recommend a prescription medicine to help prevent HIV infection. If you choose to take medicine to prevent HIV, you should first get tested for HIV. You should then be tested every 3 months for as long as you are taking the medicine. Pregnancy  If you are about to stop having your period (premenopausal) and you may become pregnant, seek counseling before you get pregnant.  Take 400 to 800 micrograms (mcg) of folic acid every day if you become pregnant.  Ask for birth control (contraception) if you want to prevent pregnancy. Osteoporosis and menopause Osteoporosis is a disease in which the bones lose minerals and strength with aging. This can result in bone fractures. If you are 65 years old or older, or if you are at risk for osteoporosis and fractures, ask your health care provider if you should:  Be screened for bone loss.  Take a calcium or vitamin D supplement to lower your risk of fractures.  Be given hormone replacement therapy (HRT) to treat symptoms of menopause. Follow these instructions at home: Lifestyle  Do not use any products that contain nicotine or tobacco, such as cigarettes, e-cigarettes, and chewing tobacco. If you need help quitting, ask your health care provider.  Do not use street  drugs.  Do not share needles.  Ask your health care provider for help if you need support or information about quitting drugs. Alcohol use  Do not drink alcohol if: ? Your health care provider tells you not to drink. ? You are pregnant, may be pregnant, or are planning to become pregnant.  If you drink alcohol: ? Limit how much you use to 0-1 drink a day. ? Limit intake if you are breastfeeding.  Be aware of how much alcohol is in your drink. In the [place] one drink equals one 12 oz bottle of beer (355 mL), one 5 oz glass of wine (148 mL), or one 1 oz glass of hard liquor (44 mL). General instructions  Schedule regular health, dental, and eye exams.  Stay current with your vaccines.  Tell your health care provider if: ? You often feel depressed. ? You have ever been abused or do not feel safe at home. Summary  Adopting a healthy lifestyle and getting preventive care are important in promoting health and wellness.    Follow your health care provider's instructions about healthy diet, exercising, and getting tested or screened for diseases.  Follow your health care provider's instructions on monitoring your cholesterol and blood pressure. This information is not intended to replace advice given to you by your health care provider. Make sure you discuss any questions you have with your health care provider. Document Revised: 10/09/2018 Document Reviewed: 10/09/2018 Elsevier Patient Education  2021 Elsevier Inc.  

## 2021-01-18 ENCOUNTER — Encounter: Payer: Self-pay | Admitting: Emergency Medicine

## 2021-01-24 ENCOUNTER — Other Ambulatory Visit: Payer: Self-pay

## 2021-01-24 ENCOUNTER — Ambulatory Visit (INDEPENDENT_AMBULATORY_CARE_PROVIDER_SITE_OTHER): Payer: Self-pay | Admitting: Gastroenterology

## 2021-01-24 ENCOUNTER — Encounter: Payer: Self-pay | Admitting: Gastroenterology

## 2021-01-24 VITALS — BP 110/80 | HR 83 | Ht 63.0 in | Wt 153.0 lb

## 2021-01-24 DIAGNOSIS — R12 Heartburn: Secondary | ICD-10-CM

## 2021-01-24 DIAGNOSIS — K625 Hemorrhage of anus and rectum: Secondary | ICD-10-CM

## 2021-01-24 DIAGNOSIS — R140 Abdominal distension (gaseous): Secondary | ICD-10-CM

## 2021-01-24 DIAGNOSIS — K219 Gastro-esophageal reflux disease without esophagitis: Secondary | ICD-10-CM

## 2021-01-24 MED ORDER — PLENVU 140 G PO SOLR
140.0000 g | ORAL | 0 refills | Status: DC
Start: 2021-01-24 — End: 2021-04-15

## 2021-01-24 MED ORDER — PANTOPRAZOLE SODIUM 40 MG PO TBEC: 40.0000 mg | DELAYED_RELEASE_TABLET | Freq: Two times a day (BID) | ORAL | 1 refills | Status: DC

## 2021-01-24 NOTE — Patient Instructions (Addendum)
You have been scheduled for an endoscopy and colonoscopy. Please follow the written instructions given to you at your visit today. Please pick up your prep supplies at the pharmacy within the next 1-3 days. If you use inhalers (even only as needed), please bring them with you on the day of your procedure.  In the meantime, continue to use Miralax 17g daily for constipation. I recommend that you use a daily fiber supplement. FiberChoice samples were provided as a fiber supplement option.  We have sent the following medications to your pharmacy for you to pick up at your convenience:  pantoprazole to 40 mg twice daily (increase from previous dosage). I am hoping that this improves your symptoms of heartburn and voice changes.    If you are age 31 or younger, your body mass index should be between 19-25. Your Body mass index is 27.1 kg/m. If this is out of the aformentioned range listed, please consider follow up with your Primary Care Provider.   Due to recent changes in healthcare laws, you may see the results of your imaging and laboratory studies on MyChart before your provider has had a chance to review them.  We understand that in some cases there may be results that are confusing or concerning to you. Not all laboratory results come back in the same time frame and the provider may be waiting for multiple results in order to interpret others.  Please give [place] 48 hours in order for your provider to thoroughly review all the results before contacting the office for clarification of your results.

## 2021-01-24 NOTE — Progress Notes (Signed)
Referring Provider: Horald Pollen, * Primary Care Physician:  Horald Pollen, MD  Reason for Consultation:  Reflux and heartburn   IMPRESSION:  Post-prandial bloating: Broad differential. Increase PPI to BID. EGD to evaluate for esophagitis, gastritis, H pylori, gastric outlet obstruction, and celiac. Patient does not associated symptoms with constipation.   Reflux with persistent symptoms despite PPI therapy: Increase PPI to BID. 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 Trial of fiber supplements for stool bulking and Miralax prior to considering other treatments.   Rectal bleeding: The differential for rectal bleeding is broad.  It includes outlet sources such as fissure or hemorrhoids, as well as polyps, mass, ulcers, and colitis.  Given this differential I am recommending a colonoscopy.  PLAN: Increase pantoprazole 40 mg BID Continue Miralax 17 g daily Add daily fiber supplements ([employer] samples provided) EGD and Colonoscopy with a 2 day bowel prep  Please see the "Patient Instructions" section for addition details about the plan.  HPI: Chelsea Fernandez is a 30 y.o. female referred by Dr. Mitchel Honour for chronic heartburn only partially responsive to medication.  The history is obtained through the patient and review of her electronic health record.  She has a history of right renal cell carcinoma status post right nephrectomy, seasonal allergies, hyperlipidemia, and chronic anxiety.  She had COVID-18 September 2019.  She has three primary GI concerns: (1) Postprandial abdominal bloating x 2 months with associated early satiety and intermittent nausea. Intermittent, migratory abdominal pain.  Although she has constipation, she doesn't notice a change in bloating with defecation.   (2) Chronic heartburn treated with pantoprazole.  Presented with dysphonia and acid reflux last June. Has ongoing throat clearing. No  dysphagia, odynophagia, dysphonia, neck pain, or sore throat.  Symptoms are triggered by spicy foods and chocolate.  Recent use of NSAIDs.  (3) Years of constipation described as passing several small bowel movements every day with significant straining.  Sense of incomplete evacuation. She intermittently sees blood in the stool. No mucous. Dr. Mitchel Honour recommended to take MiraLAX daily and Dulcolax as needed which provides some relief.   Recent abdominal imaging includes: - CT of the abdomen and pelvis with and without contrast 08/10/2020 for a history of right-sided renal cell carcinoma showed changes of her radical nephrectomy and a small stable hypervascular lesion in the liver thought to be a hemangioma - Abdominal ultrasound 12/17/2020 for abdominal pain and bloating was normal  Mother with diverticulitis. Sister with stomach problems.  No known family history of colon cancer or polyps.  Maternal grandmother with stomach cancer.  No other known family history of uterine/endometrial cancer, pancreatic cancer or gastric/stomach cancer.   Past Medical History:  Diagnosis Date  . Acid reflux   . Anxiety   . ASCUS of cervix with negative high risk HPV   . Asthma   . CIN II (cervical intraepithelial neoplasia II)   . Family history of adverse reaction to anesthesia    aunt has high tolerance to anesthesia  will not put her tosleep  . Headache    migraine rarely  . History of kidney stones   . right renal ca dx'd 01/2018   Kidney cancer    Past Surgical History:  Procedure Laterality Date  . DILATION AND CURETTAGE OF UTERUS  2012  . NOSE SURGERY    . ROBOTIC ASSITED PARTIAL NEPHRECTOMY Right 04/24/2018   Procedure: ROBOTIC ASSITED NEPHRECTOMY;  Surgeon: Ceasar Mons, MD;  Location: [facility];  Service: Urology;  Laterality: Right;    Current Outpatient Medications  Medication Sig Dispense Refill  . albuterol (VENTOLIN HFA) 108 (90 Base) MCG/ACT inhaler Inhale 2 puffs into  the lungs every 6 (six) hours as needed for wheezing or shortness of breath. 8 g 3  . ipratropium (ATROVENT) 0.06 % nasal spray Place 2 sprays into both nostrils in the morning and at bedtime. 15 mL 5  . montelukast (SINGULAIR) 10 MG tablet Take 1 tablet (10 mg total) by mouth at bedtime. 30 tablet 1  . pantoprazole (PROTONIX) 40 MG tablet Take 1 daily as needed for reflux. 30 tablet 1   No current facility-administered medications for this visit.    Allergies as of 01/24/2021 - Review Complete 01/24/2021  Allergen Reaction Noted  . Other  06/10/2020    Family History  Adopted: Yes  Problem Relation Age of Onset  . Hypertension Maternal Aunt   . Hypertension Maternal Uncle   . Cancer Maternal Grandmother         STOMACH      Review of Systems: 12 system ROS is negative except as noted above with the additions of allergies, anxiety, back pain, cough, headaches, muscles pains, and sore throat.   Physical Exam: General:   Alert,  well-nourished, pleasant and cooperative in NAD Head:  Normocephalic and atraumatic. Eyes:  Sclera clear, no icterus.   Conjunctiva pink. Ears:  Normal auditory acuity. Nose:  No deformity, discharge,  or lesions. Mouth:  No deformity or lesions.   Neck:  Supple; no masses or thyromegaly. Lungs:  Clear throughout to auscultation.   No wheezes. Heart:  Regular rate and rhythm; no murmurs. Abdomen:  Soft, nontender, nondistended, normal bowel sounds, no rebound or guarding. No hepatosplenomegaly.   Rectal:  Deferred  Msk:  Symmetrical. No boney deformities LAD: No inguinal or umbilical LAD Extremities:  No clubbing or edema. Neurologic:  Alert and  oriented x4;  grossly nonfocal Skin:  Intact without significant lesions or rashes. Psych:  Alert and cooperative. Normal mood and affect.    Kimberly L. Tarri Glenn, MD, MPH 01/24/2021, 9:29 AM

## 2021-01-26 ENCOUNTER — Other Ambulatory Visit: Payer: Self-pay

## 2021-01-26 DIAGNOSIS — K219 Gastro-esophageal reflux disease without esophagitis: Secondary | ICD-10-CM

## 2021-01-26 MED ORDER — PANTOPRAZOLE SODIUM 40 MG PO TBEC: 40.0000 mg | DELAYED_RELEASE_TABLET | Freq: Two times a day (BID) | ORAL | 6 refills | Status: DC

## 2021-04-15 ENCOUNTER — Encounter: Payer: Self-pay | Admitting: Gastroenterology

## 2021-04-15 ENCOUNTER — Ambulatory Visit (AMBULATORY_SURGERY_CENTER): Payer: Self-pay | Admitting: Gastroenterology

## 2021-04-15 ENCOUNTER — Other Ambulatory Visit: Payer: Self-pay

## 2021-04-15 VITALS — BP 117/67 | HR 62 | Temp 98.6°F | Resp 23 | Ht 63.0 in | Wt 153.0 lb

## 2021-04-15 DIAGNOSIS — K648 Other hemorrhoids: Secondary | ICD-10-CM

## 2021-04-15 DIAGNOSIS — R12 Heartburn: Secondary | ICD-10-CM

## 2021-04-15 DIAGNOSIS — K2980 Duodenitis without bleeding: Secondary | ICD-10-CM

## 2021-04-15 DIAGNOSIS — K2950 Unspecified chronic gastritis without bleeding: Secondary | ICD-10-CM

## 2021-04-15 DIAGNOSIS — K219 Gastro-esophageal reflux disease without esophagitis: Secondary | ICD-10-CM

## 2021-04-15 DIAGNOSIS — R140 Abdominal distension (gaseous): Secondary | ICD-10-CM

## 2021-04-15 DIAGNOSIS — K625 Hemorrhage of anus and rectum: Secondary | ICD-10-CM

## 2021-04-15 DIAGNOSIS — K2970 Gastritis, unspecified, without bleeding: Secondary | ICD-10-CM

## 2021-04-15 DIAGNOSIS — K5900 Constipation, unspecified: Secondary | ICD-10-CM

## 2021-04-15 DIAGNOSIS — K31A0 Gastric intestinal metaplasia, unspecified: Secondary | ICD-10-CM

## 2021-04-15 MED ORDER — SODIUM CHLORIDE 0.9 % IV SOLN: 500.0000 mL | Freq: Once | INTRAVENOUS | Status: DC

## 2021-04-15 NOTE — Progress Notes (Signed)
Late entry 1116 Report given to PACU, vss

## 2021-04-15 NOTE — Op Note (Signed)
[place] Patient Name: Chelsea Fernandez Procedure Date: 04/15/2021 10:36 AM MRN: [id number] Endoscopist: Thornton Park MD, MD Age: 30 Referring MD:  Date of Birth: [birth year] Gender: Female Account #: [id number] Procedure:                Colonoscopy Indications:              Rectal bleeding                           Incidental history: Constipation Medicines:                Monitored Anesthesia Care Procedure:                Pre-Anesthesia Assessment:                           - Prior to the procedure, a History and Physical                            was performed, and patient medications and                            allergies were reviewed. The patient's tolerance of                            previous anesthesia was also reviewed. The risks                            and benefits of the procedure and the sedation                            options and risks were discussed with the patient.                            All questions were answered, and informed consent                            was obtained. Prior Anticoagulants: The patient has                            taken no previous anticoagulant or antiplatelet                            agents. ASA Grade Assessment: II - A patient with                            mild systemic disease. After reviewing the risks                            and benefits, the patient was deemed in                            satisfactory condition to undergo the procedure.  After obtaining informed consent, the colonoscope                            was passed under direct vision. Throughout the                            procedure, the patient's blood pressure, pulse, and                            oxygen saturations were monitored continuously. The                            Olympus CF-HQ190 (#[id number]) Colonoscope was                            introduced through the anus and advanced to the 3                             cm into the ileum. A second forward view of the                            right colon was performed. The colonoscopy was                            performed without difficulty. The patient tolerated                            the procedure well. The quality of the bowel                            preparation was excellent. The terminal ileum,                            ileocecal valve, appendiceal orifice, and rectum                            were photographed. Scope In: 11:08:46 AM Scope Out: 00:86:76 AM Scope Withdrawal Time: 0 hours 6 minutes 8 seconds  Total Procedure Duration: 0 hours 7 minutes 21 seconds  Findings:                 The perianal and digital rectal examinations were                            normal.                           Non-bleeding internal hemorrhoids were found.                           The colon (entire examined portion) appeared normal.                           The terminal ileum appeared normal.  The exam was otherwise without abnormality on                            direct and retroflexion views. Complications:            No immediate complications. Estimated Blood Loss:     Estimated blood loss: none. Impression:               - Non-bleeding internal hemorrhoids.                           - The entire examined colon and terminal ileum are                            normal. Recommendation:           - Patient has a contact number available for                            emergencies. The signs and symptoms of potential                            delayed complications were discussed with the                            patient. Return to normal activities tomorrow.                            Written discharge instructions were provided to the                            patient.                           - Resume previous diet.                           - Continue present medications including Miralax                             17g daily and daily fiber supplements.                           - Repeat colonoscopy at age 71 for screening                            purposes, earlier with new symptoms. Thornton Park MD, MD 04/15/2021 11:22:44 AM This report has been signed electronically.

## 2021-04-15 NOTE — Patient Instructions (Addendum)
HANDOUT ON HEMORRHOIDS GIVEN TO YOU TODAY  AWAIT BIOPSY RESULTS ON STOMACH & ESOPHAGUS   CONTINUE MIRALAX AND DAILY FIBER SUPPLEMENTS    CONTINUE PANTOPRAZOLE 40 MG TWICE A DAY  REPEAT COLONOSCOPY AT AGE 30 FOR SCREENING PURPOSES    YOU HAD AN ENDOSCOPIC PROCEDURE TODAY AT [facility]:   Refer to the procedure report that was given to you for any specific questions about what was found during the examination.  If the procedure report does not answer your questions, please call your gastroenterologist to clarify.  If you requested that your care partner not be given the details of your procedure findings, then the procedure report has been included in a sealed envelope for you to review at your convenience later.  YOU SHOULD EXPECT: Some feelings of bloating in the abdomen. Passage of more gas than usual.  Walking can help get rid of the air that was put into your GI tract during the procedure and reduce the bloating. If you had a lower endoscopy (such as a colonoscopy or flexible sigmoidoscopy) you may notice spotting of blood in your stool or on the toilet paper. If you underwent a bowel prep for your procedure, you may not have a normal bowel movement for a few days.  Please Note:  You might notice some irritation and congestion in your nose or some drainage.  This is from the oxygen used during your procedure.  There is no need for concern and it should clear up in a day or so.  SYMPTOMS TO REPORT IMMEDIATELY:  Following lower endoscopy (colonoscopy or flexible sigmoidoscopy):  Excessive amounts of blood in the stool  Significant tenderness or worsening of abdominal pains  Swelling of the abdomen that is new, acute  Fever of 100F or higher  Following upper endoscopy (EGD)  Vomiting of blood or coffee ground material  New chest pain or pain under the shoulder blades  Painful or persistently difficult swallowing  New shortness of breath  Fever of 100F or  higher  Black, tarry-looking stools  For urgent or emergent issues, a gastroenterologist can be reached at any hour by calling [phone]. Do not use MyChart messaging for urgent concerns.    DIET:  We do recommend a small meal at first, but then you may proceed to your regular diet.  Drink plenty of fluids but you should avoid alcoholic beverages for 24 hours.  ACTIVITY:  You should plan to take it easy for the rest of today and you should NOT DRIVE or use heavy machinery until tomorrow (because of the sedation medicines used during the test).    FOLLOW UP: Our staff will call the number listed on your records 48-72 hours following your procedure to check on you and address any questions or concerns that you may have regarding the information given to you following your procedure. If we do not reach you, we will leave a message.  We will attempt to reach you two times.  During this call, we will ask if you have developed any symptoms of COVID 19. If you develop any symptoms (ie: fever, flu-like symptoms, shortness of breath, cough etc.) before then, please call [phone].  If you test positive for Covid 19 in the 2 weeks post procedure, please call and report this information to [place].    If any biopsies were taken you will be contacted by phone or by letter within the next 1-3 weeks.  Please call us at [phone] if you  have not heard about the biopsies in 3 weeks.    SIGNATURES/CONFIDENTIALITY: You and/or your care partner have signed paperwork which will be entered into your electronic medical record.  These signatures attest to the fact that that the information above on your After Visit Summary has been reviewed and is understood.  Full responsibility of the confidentiality of this discharge information lies with you and/or your care-partner.

## 2021-04-15 NOTE — Op Note (Signed)
[place] Patient Name: Chelsea Fernandez Procedure Date: 04/15/2021 10:46 AM MRN: [id number] Endoscopist: Thornton Park MD, MD Age: 30 Referring MD:  Date of Birth: 1990/11/27 Gender: Female Account #: [id number] Procedure:                Upper GI endoscopy Indications:              Esophageal reflux symptoms that persist despite                            appropriate therapy, Abdominal bloating Medicines:                Monitored Anesthesia Care Procedure:                Pre-Anesthesia Assessment:                           - Prior to the procedure, a History and Physical                            was performed, and patient medications and                            allergies were reviewed. The patient's tolerance of                            previous anesthesia was also reviewed. The risks                            and benefits of the procedure and the sedation                            options and risks were discussed with the patient.                            All questions were answered, and informed consent                            was obtained. Prior Anticoagulants: The patient has                            taken no previous anticoagulant or antiplatelet                            agents. ASA Grade Assessment: II - A patient with                            mild systemic disease. After reviewing the risks                            and benefits, the patient was deemed in                            satisfactory condition to undergo the procedure.  After obtaining informed consent, the endoscope was                            passed under direct vision. Throughout the                            procedure, the patient's blood pressure, pulse, and                            oxygen saturations were monitored continuously. The                            Endoscope was introduced through the mouth, and                            advanced to  the third part of duodenum. The upper                            GI endoscopy was accomplished without difficulty.                            The patient tolerated the procedure well. Scope In: Scope Out: Findings:                 The examined esophagus was normal. Biopsies were                            taken from the distal esophagus with a cold forceps                            for histology. Estimated blood loss was minimal.                           The entire examined stomach was normal. Biopsies                            were taken from the antrum, body, and fundus with a                            cold forceps for histology. Estimated blood loss                            was minimal.                           The examined duodenum was normal. Biopsies were                            taken with a cold forceps for histology. Estimated                            blood loss was minimal.  The exam was otherwise without abnormality. Complications:            No immediate complications. Estimated blood loss:                            Minimal. Estimated Blood Loss:     Estimated blood loss was minimal. Impression:               - Normal esophagus. Biopsied.                           - Normal stomach. Biopsied.                           - Normal examined duodenum. Biopsied.                           - The examination was otherwise normal. Recommendation:           - Patient has a contact number available for                            emergencies. The signs and symptoms of potential                            delayed complications were discussed with the                            patient. Return to normal activities tomorrow.                            Written discharge instructions were provided to the                            patient.                           - Resume previous diet.                           - Continue present medications including                             pantoprazole 40 mg BID.                           - Await pathology results. Thornton Park MD, MD 04/15/2021 11:19:23 AM This report has been signed electronically.

## 2021-04-15 NOTE — Progress Notes (Signed)
Called to room to assist during endoscopic procedure.  Patient ID and intended procedure confirmed with present staff. Received instructions for my participation in the procedure from the performing physician.  

## 2021-04-15 NOTE — Progress Notes (Signed)
Late entry 1054 Robinul 0.1 mg IV given due large amount of secretions upon assessment.  MD made aware, vss

## 2021-04-19 ENCOUNTER — Telehealth: Payer: Self-pay

## 2021-04-19 NOTE — Telephone Encounter (Signed)
  Follow up Call-  Call back number 04/15/2021  Post procedure Call Back phone  # [phone]  Permission to leave phone message Yes  Some recent data might be hidden     Patient questions:  Do you have a fever, pain , or abdominal swelling? No. Pain Score  0 *  Have you tolerated food without any problems? Yes.    Have you been able to return to your normal activities? Yes.    Do you have any questions about your discharge instructions: Diet   No. Medications  No. Follow up visit  No.  Do you have questions or concerns about your Care? No.  Actions: * If pain score is 4 or above: No action needed, pain <4. Have you developed a fever since your procedureno  2.   Have you had an respiratory symptoms (SOB or cough) since your procedure? no  3.   Have you tested positive for COVID 19 since your procedure no  4.   Have you had any family members/close contacts diagnosed with the COVID 19 since your procedure?  no   If yes to any of these questions please route to Joylene John, RN and Joella Prince, RN

## 2021-05-13 ENCOUNTER — Telehealth: Payer: Self-pay | Admitting: Gastroenterology

## 2021-05-13 NOTE — Telephone Encounter (Signed)
Inbound call from pt requesting a call back stating that she wondering if her results had came back from her procedure. Please advise. Thanks.

## 2021-05-16 NOTE — Telephone Encounter (Signed)
Spoke with pt and she is aware of results and recommendations. Pt knows scheduling will contact her regarding scheduling f/u appt.

## 2021-05-17 NOTE — Telephone Encounter (Signed)
Thank you :)

## 2021-06-21 ENCOUNTER — Telehealth: Payer: Self-pay | Admitting: Physician Assistant

## 2021-06-21 DIAGNOSIS — U071 COVID-19: Secondary | ICD-10-CM

## 2021-06-21 MED ORDER — MOLNUPIRAVIR EUA 200MG CAPSULE: 4.0000 | ORAL_CAPSULE | Freq: Two times a day (BID) | ORAL | 0 refills | Status: AC

## 2021-06-21 NOTE — Progress Notes (Signed)
Virtual Visit Consent   Yoyo [place]wood, you are scheduled for a virtual visit with a [facility] provider today.     Just as with appointments in the office, your consent must be obtained to participate.  Your consent will be active for this visit and any virtual visit you may have with one of our providers in the next 365 days.     If you have a MyChart account, a copy of this consent can be sent to you electronically.  All virtual visits are billed to your insurance company just like a traditional visit in the office.    As this is a virtual visit, video technology does not allow for your provider to perform a traditional examination.  This may limit your provider's ability to fully assess your condition.  If your provider identifies any concerns that need to be evaluated in person or the need to arrange testing (such as labs, EKG, etc.), we will make arrangements to do so.     Although advances in technology are sophisticated, we cannot ensure that it will always work on either your end or our end.  If the connection with a video visit is poor, the visit may have to be switched to a telephone visit.  With either a video or telephone visit, we are not always able to ensure that we have a secure connection.     I need to obtain your verbal consent now.   Are you willing to proceed with your visit today?    Sarahelizabeth Cange has provided verbal consent on 06/21/2021 for a virtual visit (video or telephone).   Leeanne Rio, [place]   Date: 06/21/2021 9:42 AM   Virtual Visit via Video Note   I, Leeanne Rio, connected with  Marquerite Heggs  ([id number], 1991/07/27) on 06/21/21 at  9:30 AM EDT by a video-enabled telemedicine application and verified that I am speaking with the correct person using two identifiers.  Location: Patient: Virtual Visit Location Patient: Home Provider: Virtual Visit Location Provider: Home Office   I discussed the limitations of evaluation and  management by telemedicine and the availability of in person appointments. The patient expressed understanding and agreed to proceed.    History of Present Illness: Chelsea Fernandez is a 30 y.o. who identifies as a female who was assigned female at birth, and is being seen today for COVID-19. Patient endorses symptoms starting [place] after [place]. That evening noting nasal congestion and sore throat moving into fatigue, fever (Tmax 101). Denies chest congestion but noting some mild cough. Denies chest pain. Some mild SOB yesterday but none today. Has noted some mild low back pain. Has taken two home COVID tests that were both positive. Patient endorses taking Mucinex Sinus Max and Tylenol for her symptoms.   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   ASCUS with positive high risk HPV cervical 10/04/2016   Hyperlipidemia 09/05/2016   Fluctuation of weight 09/04/2016    Allergies:  Allergies  Allergen Reactions   Other     Almond Butter   Medications:  Current Outpatient Medications:    molnupiravir EUA 200 mg CAPS, Take 4 capsules (800 mg total) by mouth 2 (two) times daily for 5 days., Disp: 40 capsule,  Rfl: 0   albuterol (VENTOLIN HFA) 108 (90 Base) MCG/ACT inhaler, Inhale 2 puffs into the lungs every 6 (six) hours as needed for wheezing or shortness of breath., Disp: 8 g, Rfl: 3   ipratropium (ATROVENT) 0.06 % nasal spray, Place 2 sprays into both nostrils in the morning and at bedtime., Disp: 15 mL, Rfl: 5   montelukast (SINGULAIR) 10 MG tablet, Take 1 tablet (10 mg total) by mouth at bedtime., Disp: 30 tablet, Rfl: 1   pantoprazole (PROTONIX) 40 MG tablet, Take 1 tablet (40 mg total) by mouth 2 (two) times daily., Disp: 60 tablet, Rfl:  6  Observations/Objective: Patient is well-developed, well-nourished in no acute distress.  Resting comfortably at home.  Head is normocephalic, atraumatic.  No labored breathing. Speech is clear and coherent with logical content.  Patient is alert and oriented at baseline.   Assessment and Plan: 1. COVID-19 - MyChart COVID-19 home monitoring program; Future - molnupiravir EUA 200 mg CAPS; Take 4 capsules (800 mg total) by mouth 2 (two) times daily for 5 days.  Dispense: 40 capsule; Refill: 0 Patient with risk factors for complicated course of illness - allergic asthma, hx RCC s/p unilateral nephrectomy. Discussed risks/benefits of antiviral medications including most common potential ADRs. Patient voiced understanding and would like to proceed with antiviral medication. They are candidate for molnupiravir. Rx sent to pharmacy. Supportive measures, OTC medications and vitamin regimen reviewed. Patient has been enrolled in a MyChart COVID symptom monitoring program. Samule Dry reviewed in detail. Strict ER precautions discussed with patient.    Follow Up Instructions: I discussed the assessment and treatment plan with the patient. The patient was provided an opportunity to ask questions and all were answered. The patient agreed with the plan and demonstrated an understanding of the instructions.  A copy of instructions were sent to the patient via MyChart.  The patient was advised to call back or seek an in-person evaluation if the symptoms worsen or if the condition fails to improve as anticipated.  Time:  I spent 15 minutes with the patient via telehealth technology discussing the above problems/concerns.    Leeanne Rio, PA-C

## 2021-06-21 NOTE — Patient Instructions (Addendum)
  Chelsea Fernandez, thank you for joining Leeanne Rio, PA-C for today's virtual visit.  While this provider is not your primary care provider (PCP), if your PCP is located in our provider database this encounter information will be shared with them immediately following your visit.  Consent: (Patient) Chelsea Fernandez provided verbal consent for this virtual visit at the beginning of the encounter.  Current Medications:  Current Outpatient Medications:    albuterol (VENTOLIN HFA) 108 (90 Base) MCG/ACT inhaler, Inhale 2 puffs into the lungs every 6 (six) hours as needed for wheezing or shortness of breath., Disp: 8 g, Rfl: 3   ipratropium (ATROVENT) 0.06 % nasal spray, Place 2 sprays into both nostrils in the morning and at bedtime., Disp: 15 mL, Rfl: 5   montelukast (SINGULAIR) 10 MG tablet, Take 1 tablet (10 mg total) by mouth at bedtime., Disp: 30 tablet, Rfl: 1   pantoprazole (PROTONIX) 40 MG tablet, Take 1 tablet (40 mg total) by mouth 2 (two) times daily., Disp: 60 tablet, Rfl: 6   Medications ordered in this encounter:  No orders of the defined types were placed in this encounter.    *If you need refills on other medications prior to your next appointment, please contact your pharmacy*  Follow-Up: Call back or seek an in-person evaluation if the symptoms worsen or if the condition fails to improve as anticipated.  Other Instructions Please keep well-hydrated and get plenty of rest. Start a saline nasal rinse to flush out your nasal passages. You can use Mucinex Sinus Max (no more than every 12 hours) to help thin congestion. If you have a humidifier, running in the bedroom at night. I want you to start OTC vitamin D3 1000 units daily, vitamin C 1000 mg daily, and a zinc supplement. Please take prescribed medications as directed.  You have been enrolled in a MyChart symptom monitoring program. Please answer these questions daily so we can keep track of how you are  doing.  You were to quarantine for 5 days from onset of your symptoms.  After day 5, if you have had no fever and you are feeling better, you can end quarantine but need to mask for an additional 5 days. After day 5 if you have a fever or are having significant symptoms, please quarantine for full 10 days.  If you note any worsening of symptoms, any significant shortness of breath or any chest pain, please seek ER evaluation ASAP.  Please do not delay care!  If you have been instructed to have an in-person evaluation today at a local Urgent Care facility, please use the link below. It will take you to a list of all of our available [facility], including address, phone number and hours of operation. Please do not delay care.  [facility]  If you or a family member do not have a primary care provider, use the link below to schedule a visit and establish care. When you choose a [facility] primary care physician or advanced practice provider, you gain a long-term partner in health. Find a Primary Care Provider  Learn more about [facility]'s in-office and virtual care options: [facility]

## 2021-06-22 ENCOUNTER — Encounter: Payer: Self-pay | Admitting: Emergency Medicine

## 2021-06-25 ENCOUNTER — Other Ambulatory Visit: Payer: Self-pay | Admitting: Emergency Medicine

## 2021-06-25 MED ORDER — BENZONATATE 200 MG PO CAPS
200.0000 mg | ORAL_CAPSULE | Freq: Two times a day (BID) | ORAL | 0 refills | Status: DC | PRN
Start: 2021-06-25 — End: 2022-10-31

## 2021-07-02 ENCOUNTER — Telehealth: Payer: Self-pay

## 2021-07-02 ENCOUNTER — Encounter (INDEPENDENT_AMBULATORY_CARE_PROVIDER_SITE_OTHER): Payer: Self-pay

## 2021-07-02 NOTE — Telephone Encounter (Signed)
Covid last week sx lasted a week. Sx started 06/19/21. 06/23/21 cough began.Cough very frequently. No fever. Dry cough.Mucinex OTC. Benzonatate. No SOB. Cough wakes her up at night. Drinking plenty. Using humidifier. Wheezing occasionally during cough.Pt has h/o asthma. Pt have post nasal drip and the right side of throat is sore. Pt stated she needs a refill of the Tessalon. Advised pt use her MDI Albuterol to see if it helps with her coughing.   If cough remains the same or better: continue to treat with over the counter medications.  Hard candy or cough drops and drinking warm fluids. Adults can also use honey 2 tsp (10 ML) at bedtime.   If cough is becoming worse even with the use of over the counter medications and patient is not able to sleep at night, cough becomes productive with sputum that maybe yellow or green in color, contact PCP.   Advised pt to make a virtual appt. Pt stated she will do a VV.

## 2021-08-05 ENCOUNTER — Ambulatory Visit: Payer: Self-pay | Admitting: Gastroenterology

## 2021-08-05 VITALS — BP 110/70 | HR 65 | Ht 63.0 in | Wt 156.4 lb

## 2021-08-05 DIAGNOSIS — K219 Gastro-esophageal reflux disease without esophagitis: Secondary | ICD-10-CM

## 2021-08-05 DIAGNOSIS — K2970 Gastritis, unspecified, without bleeding: Secondary | ICD-10-CM

## 2021-08-05 DIAGNOSIS — K31A0 Gastric intestinal metaplasia, unspecified: Secondary | ICD-10-CM

## 2021-08-05 DIAGNOSIS — Z800 Family history of malignant neoplasm of digestive organs: Secondary | ICD-10-CM

## 2021-08-05 DIAGNOSIS — K2990 Gastroduodenitis, unspecified, without bleeding: Secondary | ICD-10-CM

## 2021-08-05 NOTE — Progress Notes (Signed)
Referring Provider: Horald Pollen, * Primary Care Physician:  Horald Pollen, MD  Chief complaint:  Reflux and heartburn   IMPRESSION:  Post-prandial bloating: May be esophagitis or reflux as symptoms have improved with PPI BID therapy.   Reflux esophagitis with persistent symptoms despite PPI therapy: Continues to have some dysphonia despite PPI BID.  Gastritis with biopsies showing intestinal metaplasia: Grandmother with gastric cancer.   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 Controlled with Miralax PRN.   Rectal bleeding: Likely due to internal hemorrhoids given her recent colonoscopy.    PLAN: - Continue pantoprazole 40 mg BID - Continue famotidine 20 mg BID - Continue Miralax 17 g daily - High fiber diet recommended. - Anusol HC 2.5% applied sparingly to your rectum twice daily.  - EGD for gastric mapping - 24 hour pH probe if symptoms are not improving on repeat EGD and additional therapy with BID PPI and H2B   Please see the "Patient Instructions" section for addition details about the plan.  HPI: Chelsea Fernandez is a 30 y.o. female who returns in follow-up after endoscopic evaluation.  Her mother accompanies her to this appointment.  She has a history of right renal cell carcinoma status post right nephrectomy, seasonal allergies, hyperlipidemia, and chronic anxiety.  She had COVID-18 September 2019.  When I saw her in consultation 01/24/21 she had three primary GI concerns: (1) Postprandial abdominal bloating x 2 months with associated early satiety and intermittent nausea. Intermittent, migratory abdominal pain.  Although she has constipation, she doesn't notice a change in bloating with defecation.  (2) Chronic heartburn treated with pantoprazole.  Presented with dysphonia and acid reflux last June. Has ongoing throat clearing. No dysphagia, odynophagia, dysphonia, neck pain, or sore throat.  Symptoms are triggered by  spicy foods and chocolate.  Recent use of NSAIDs. (3) Years of constipation described as passing several small bowel movements every day with significant straining.  Sense of incomplete evacuation. She intermittently sees blood in the stool. No mucous. Dr. Mitchel Honour recommended to take MiraLAX daily and Dulcolax as needed which provides some relief.  She returns today after endoscopic evaluation 04/15/21 showed: EGD: Normal.  Colonoscopy: internal hemorrhoids, otherwise normal Pathology results: reflux esophagitis, chronic gastritis and focal intestinal metaplasia, peptic duodenitis.   Hoarseness is unchanged. Feels like she may swallow too much spit. Acid reflux has improved. Even her constipation and bloating improved. Is using Miralax when she needs when she needs it - last was several weeks.   She was frustrated that she had to wait so long for this appointment to review the results. She was under the impression from prior communications with our office that she might have cancer.  Her mother accompanies her to this appointment out of this concern for cancer.   Recent abdominal imaging includes: - CT of the abdomen and pelvis with and without contrast 08/10/2020 for a history of right-sided renal cell carcinoma showed changes of her radical nephrectomy and a small stable hypervascular lesion in the liver thought to be a hemangioma - Abdominal ultrasound 12/17/2020 for abdominal pain and bloating was normal  Mother with diverticulitis. Sister with stomach problems.  No known family history of colon cancer or polyps.  Maternal grandmother with stomach cancer.  No other known family history of uterine/endometrial cancer, pancreatic cancer or gastric/stomach cancer.   Past Medical History:  Diagnosis Date   Acid reflux    Anxiety    ASCUS of cervix with negative high risk HPV  Asthma    CIN II (cervical intraepithelial neoplasia II)    Family history of adverse reaction to anesthesia     aunt has high tolerance to anesthesia  will not put her tosleep   Headache    migraine rarely   History of kidney stones    right renal ca dx'd 01/2018   Kidney cancer    Past Surgical History:  Procedure Laterality Date   DILATION AND CURETTAGE OF UTERUS  2012   NOSE SURGERY     ROBOTIC ASSITED PARTIAL NEPHRECTOMY Right 04/24/2018   Procedure: ROBOTIC ASSITED NEPHRECTOMY;  Surgeon: Ceasar Mons, MD;  Location: [facility];  Service: Urology;  Laterality: Right;    Current Outpatient Medications  Medication Sig Dispense Refill   albuterol (VENTOLIN HFA) 108 (90 Base) MCG/ACT inhaler Inhale 2 puffs into the lungs every 6 (six) hours as needed for wheezing or shortness of breath. 8 g 3   benzonatate (TESSALON) 200 MG capsule Take 1 capsule (200 mg total) by mouth 2 (two) times daily as needed for cough. 20 capsule 0   ipratropium (ATROVENT) 0.06 % nasal spray Place 2 sprays into both nostrils in the morning and at bedtime. 15 mL 5   montelukast (SINGULAIR) 10 MG tablet Take 1 tablet (10 mg total) by mouth at bedtime. 30 tablet 1   pantoprazole (PROTONIX) 40 MG tablet Take 1 tablet (40 mg total) by mouth 2 (two) times daily. 60 tablet 6   No current facility-administered medications for this visit.    Allergies as of 08/05/2021 - Review Complete 08/05/2021  Allergen Reaction Noted   Other  06/10/2020    Family History  Adopted: Yes  Problem Relation Age of Onset   Hypertension Maternal Aunt    Hypertension Maternal Uncle    Stomach cancer Maternal Grandmother    Cancer Maternal Grandmother         STOMACH    Colon cancer Neg Hx    Esophageal cancer Neg Hx    Rectal cancer Neg Hx     Physical Exam: General:   Alert,  well-nourished, pleasant and cooperative in NAD Head:  Normocephalic and atraumatic. Eyes:  Sclera clear, no icterus.   Conjunctiva pink. Abdomen:  Soft, nontender, nondistended, normal bowel sounds, no rebound or guarding. No hepatosplenomegaly.    Neurologic:  Alert and  oriented x4;  grossly nonfocal Skin:  Intact without significant lesions or rashes. Psych:  Alert and cooperative. Normal mood and affect.    Chelsea Fernandez L. Tarri Glenn, MD, MPH 08/11/2021, 4:41 AM

## 2021-08-05 NOTE — Patient Instructions (Signed)
You have been scheduled for an endoscopy. Please follow written instructions given to you at your visit today. If you use inhalers (even only as needed), please bring them with you on the day of your procedure. _______________________________________________________  If you are age 31 or older, your body mass index should be between 23-30. Your Body mass index is 27.71 kg/m. If this is out of the aforementioned range listed, please consider follow up with your Primary Care Provider.  If you are age 72 or younger, your body mass index should be between 19-25. Your Body mass index is 27.71 kg/m. If this is out of the aformentioned range listed, please consider follow up with your Primary Care Provider.  ________________________________________________________  The [facility] providers would like to encourage you to use [facility] to communicate with providers for non-urgent requests or questions.  Due to long hold times on the telephone, sending your provider a message by [facility] may be a faster and more efficient way to get a response.  Please allow 48 business hours for a response.  Please remember that this is for non-urgent requests.   Due to recent changes in healthcare laws, you may see the results of your imaging and laboratory studies on MyChart before your provider has had a chance to review them.  We understand that in some cases there may be results that are confusing or concerning to you. Not all laboratory results come back in the same time frame and the provider may be waiting for multiple results in order to interpret others.  Please give [place] 48 hours in order for your provider to thoroughly review all the results before contacting the office for clarification of your results.

## 2021-08-11 ENCOUNTER — Encounter: Payer: Self-pay | Admitting: Gastroenterology

## 2021-08-29 ENCOUNTER — Other Ambulatory Visit: Payer: Self-pay | Admitting: Urology

## 2021-08-29 DIAGNOSIS — C641 Malignant neoplasm of right kidney, except renal pelvis: Secondary | ICD-10-CM

## 2021-09-05 ENCOUNTER — Other Ambulatory Visit: Payer: Self-pay

## 2021-09-05 ENCOUNTER — Other Ambulatory Visit (HOSPITAL_COMMUNITY): Payer: Self-pay | Admitting: Urology

## 2021-09-05 ENCOUNTER — Ambulatory Visit (HOSPITAL_COMMUNITY)
Admission: RE | Admit: 2021-09-05 | Discharge: 2021-09-05 | Disposition: A | Discharge status: Home / Self Care | Payer: Self-pay | Source: Ambulatory Visit | Attending: Urology | Admitting: Urology

## 2021-09-05 DIAGNOSIS — C641 Malignant neoplasm of right kidney, except renal pelvis: Secondary | ICD-10-CM | POA: Insufficient documentation

## 2021-09-06 ENCOUNTER — Ambulatory Visit
Admission: RE | Admit: 2021-09-06 | Discharge: 2021-09-06 | Disposition: A | Discharge status: Home / Self Care | Payer: Self-pay | Source: Ambulatory Visit | Attending: Urology | Admitting: Urology

## 2021-09-06 DIAGNOSIS — C641 Malignant neoplasm of right kidney, except renal pelvis: Secondary | ICD-10-CM

## 2021-09-06 MED ORDER — IOPAMIDOL (ISOVUE-300) INJECTION 61%
100.0000 mL | Freq: Once | INTRAVENOUS | Status: AC | PRN
  Administered 2021-09-06: 100 mL via INTRAVENOUS

## 2021-09-09 ENCOUNTER — Encounter: Payer: Self-pay | Admitting: Gastroenterology

## 2021-09-09 ENCOUNTER — Other Ambulatory Visit: Payer: Self-pay

## 2021-09-09 ENCOUNTER — Ambulatory Visit (AMBULATORY_SURGERY_CENTER): Payer: Self-pay | Admitting: Gastroenterology

## 2021-09-09 VITALS — BP 106/62 | HR 61 | Temp 98.0°F | Resp 13 | Ht 63.0 in | Wt 156.0 lb

## 2021-09-09 DIAGNOSIS — K31A0 Gastric intestinal metaplasia, unspecified: Secondary | ICD-10-CM

## 2021-09-09 DIAGNOSIS — K31A11 Gastric intestinal metaplasia without dysplasia, involving the antrum: Secondary | ICD-10-CM

## 2021-09-09 DIAGNOSIS — K219 Gastro-esophageal reflux disease without esophagitis: Secondary | ICD-10-CM

## 2021-09-09 DIAGNOSIS — K2950 Unspecified chronic gastritis without bleeding: Secondary | ICD-10-CM

## 2021-09-09 DIAGNOSIS — K2970 Gastritis, unspecified, without bleeding: Secondary | ICD-10-CM

## 2021-09-09 MED ORDER — SODIUM CHLORIDE 0.9 % IV SOLN: 500.0000 mL | Freq: Once | INTRAVENOUS | Status: DC

## 2021-09-09 NOTE — Progress Notes (Signed)
VS completed by [place].   Pt's states no medical or surgical changes since previsit or office visit.  

## 2021-09-09 NOTE — Op Note (Signed)
[facility] Patient Name: Noretta Frier Procedure Date: 09/09/2021 10:11 AM MRN: [id number] Endoscopist: Thornton Park MD, MD Age: 30 Referring MD:  Date of Birth: 11/10/1990 Gender: Female Account #: [id number] Procedure:                Upper GI endoscopy Indications:              Follow-up of gastric intestinal metaplasia on EGD                            03/2021, repeat EGD recommended for gastric mapping                           Maternal grandmother with gastric cancer Medicines:                Monitored Anesthesia Care Procedure:                Pre-Anesthesia Assessment:                           - Prior to the procedure, a History and Physical                            was performed, and patient medications and                            allergies were reviewed. The patient's tolerance of                            previous anesthesia was also reviewed. The risks                            and benefits of the procedure and the sedation                            options and risks were discussed with the patient.                            All questions were answered, and informed consent                            was obtained. Prior Anticoagulants: The patient has                            taken no previous anticoagulant or antiplatelet                            agents. ASA Grade Assessment: II - A patient with                            mild systemic disease. After reviewing the risks                            and benefits, the patient was deemed in  satisfactory condition to undergo the procedure.                           After obtaining informed consent, the endoscope was                            passed under direct vision. Throughout the                            procedure, the patient's blood pressure, pulse, and                            oxygen saturations were monitored continuously. The                            GIF  [id number] #[id number] was introduced through the                            mouth, and advanced to the third part of duodenum.                            The upper GI endoscopy was accomplished without                            difficulty. The patient tolerated the procedure                            well. Scope In: Scope Out: Findings:                 The examined esophagus was normal.                           Diffuse mild mucosal changes characterized by an                            increased vascular pattern were found in the entire                            examined stomach. Biopsies were taken from the                            antrum, antrum greater curver, antrum lesser curve,                            angularis, body greater curve, body lesser curve,                            and fundus with a cold forceps for histology.                            Estimated blood loss was minimal.                           The examined duodenum was normal.  The cardia and gastric fundus were normal on                            retroflexion.                           The exam was otherwise without abnormality. Complications:            No immediate complications. Estimated blood loss:                            Minimal. Estimated Blood Loss:     Estimated blood loss was minimal. Impression:               - Normal esophagus.                           - Increased vascular pattern mucosa in the stomach.                            Biopsied.                           - Normal examined duodenum.                           - The examination was otherwise normal. Recommendation:           - Patient has a contact number available for                            emergencies. The signs and symptoms of potential                            delayed complications were discussed with the                            patient. Return to normal activities tomorrow.                             Written discharge instructions were provided to the                            patient.                           - Resume previous diet.                           - Continue present medications.                           - Await pathology results.                           - Office follow-up to review these results. Thornton Park MD, MD 09/09/2021 10:38:51 AM This report has been signed electronically.

## 2021-09-09 NOTE — Patient Instructions (Addendum)
Everything looked good today. We did take biopsies to follow up on the changes seen on your previous exam. My nurse will call you to schedule an office visit to discuss the results from the biopsies.   YOU HAD AN ENDOSCOPIC PROCEDURE TODAY AT [facility]:   Refer to the procedure report that was given to you for any specific questions about what was found during the examination.  If the procedure report does not answer your questions, please call your gastroenterologist to clarify.  If you requested that your care partner not be given the details of your procedure findings, then the procedure report has been included in a sealed envelope for you to review at your convenience later.  YOU SHOULD EXPECT: Some feelings of bloating in the abdomen. Passage of more gas than usual.  Walking can help get rid of the air that was put into your GI tract during the procedure and reduce the bloating. If you had a lower endoscopy (such as a colonoscopy or flexible sigmoidoscopy) you may notice spotting of blood in your stool or on the toilet paper. If you underwent a bowel prep for your procedure, you may not have a normal bowel movement for a few days.  Please Note:  You might notice some irritation and congestion in your nose or some drainage.  This is from the oxygen used during your procedure.  There is no need for concern and it should clear up in a day or so.  SYMPTOMS TO REPORT IMMEDIATELY:  Following upper endoscopy (EGD)  Vomiting of blood or coffee ground material  New chest pain or pain under the shoulder blades  Painful or persistently difficult swallowing  New shortness of breath  Fever of 100F or higher  Black, tarry-looking stools  For urgent or emergent issues, a gastroenterologist can be reached at any hour by calling [phone]. Do not use MyChart messaging for urgent concerns.    DIET:  We do recommend a small meal at first, but then you may proceed to your regular  diet.  Drink plenty of fluids but you should avoid alcoholic beverages for 24 hours.  ACTIVITY:  You should plan to take it easy for the rest of today and you should NOT DRIVE or use heavy machinery until tomorrow (because of the sedation medicines used during the test).    FOLLOW UP: Our staff will call the number listed on your records 48-72 hours following your procedure to check on you and address any questions or concerns that you may have regarding the information given to you following your procedure. If we do not reach you, we will leave a message.  We will attempt to reach you two times.  During this call, we will ask if you have developed any symptoms of COVID 19. If you develop any symptoms (ie: fever, flu-like symptoms, shortness of breath, cough etc.) before then, please call [phone].  If you test positive for Covid 19 in the 2 weeks post procedure, please call and report this information to [place].    If any biopsies were taken you will be contacted by phone or by letter within the next 1-3 weeks.  Please call us at [phone] if you have not heard about the biopsies in 3 weeks.    SIGNATURES/CONFIDENTIALITY: You and/or your care partner have signed paperwork which will be entered into your electronic medical record.  These signatures attest to the fact that that the information above on your After Visit  Summary has been reviewed and is understood.  Full responsibility of the confidentiality of this discharge information lies with you and/or your care-partner.

## 2021-09-09 NOTE — Progress Notes (Signed)
Referring Provider: Horald Pollen, * Primary Care Physician:  Horald Pollen, MD  Indication for procedure:  Intestinal metaplasia, family history of gastric cancer   IMPRESSION:  Gastritis with biopsies showing intestinal metaplasia: Grandmother with gastric cancer.  She is an appropriate candidate for monitored anesthesia care in the [facility]   PLAN: EGD for gastric mapping 24 hour pH probe if symptoms are not improving on repeat EGD and additional therapy with BID PPI and H2B    HPI: Chelsea Fernandez is a 30 y.o. female who presents for gastric mapping after she was found to have intestinal metaplasia on recent EGD. Please see my 08/05/21 office note for complete details.   Endoscopic evaluation 04/15/21 showed: EGD: Normal.  Colonoscopy: internal hemorrhoids, otherwise normal Pathology results: reflux esophagitis, chronic gastritis and focal intestinal metaplasia, peptic duodenitis.   Maternal grandmother with stomach cancer.  No other known family history of uterine/endometrial cancer, pancreatic cancer or gastric/stomach cancer.   Past Medical History:  Diagnosis Date   Acid reflux    Anxiety    ASCUS of cervix with negative high risk HPV    Asthma    CIN II (cervical intraepithelial neoplasia II)    Family history of adverse reaction to anesthesia    aunt has high tolerance to anesthesia  will not put her tosleep   Headache    migraine rarely   History of kidney stones    right renal ca dx'd 01/2018   Kidney cancer    Past Surgical History:  Procedure Laterality Date   DILATION AND CURETTAGE OF UTERUS  2012   NOSE SURGERY     ROBOTIC ASSITED PARTIAL NEPHRECTOMY Right 04/24/2018   Procedure: ROBOTIC ASSITED NEPHRECTOMY;  Surgeon: Ceasar Mons, MD;  Location: [facility];  Service: Urology;  Laterality: Right;    Current Outpatient Medications  Medication Sig Dispense Refill   albuterol (VENTOLIN HFA) 108 (90 Base) MCG/ACT inhaler Inhale 2  puffs into the lungs every 6 (six) hours as needed for wheezing or shortness of breath. 8 g 3   benzonatate (TESSALON) 200 MG capsule Take 1 capsule (200 mg total) by mouth 2 (two) times daily as needed for cough. 20 capsule 0   ipratropium (ATROVENT) 0.06 % nasal spray Place 2 sprays into both nostrils in the morning and at bedtime. 15 mL 5   montelukast (SINGULAIR) 10 MG tablet Take 1 tablet (10 mg total) by mouth at bedtime. 30 tablet 1   pantoprazole (PROTONIX) 40 MG tablet Take 1 tablet (40 mg total) by mouth 2 (two) times daily. 60 tablet 6   No current facility-administered medications for this visit.    Allergies as of 09/09/2021 - Review Complete 08/11/2021  Allergen Reaction Noted   Other  06/10/2020    Family History  Adopted: Yes  Problem Relation Age of Onset   Hypertension Maternal Aunt    Hypertension Maternal Uncle    Stomach cancer Maternal Grandmother    Cancer Maternal Grandmother         STOMACH    Colon cancer Neg Hx    Esophageal cancer Neg Hx    Rectal cancer Neg Hx     Physical Exam: General:   Alert,  well-nourished, pleasant and cooperative in NAD Head:  Normocephalic and atraumatic. Eyes:  Sclera clear, no icterus.   Conjunctiva pink. Abdomen:  Soft, nontender, nondistended, normal bowel sounds, no rebound or guarding. No hepatosplenomegaly.   Neurologic:  Alert and  oriented x4;  grossly nonfocal Skin:  Intact without significant lesions or rashes. Psych:  Alert and cooperative. Normal mood and affect.    Ermalinda Joubert L. Tarri Glenn, MD, MPH 09/09/2021, 9:45 AM

## 2021-09-09 NOTE — Progress Notes (Signed)
Vss nad transferred to pacu 

## 2021-09-13 ENCOUNTER — Telehealth: Payer: Self-pay | Admitting: *Deleted

## 2021-09-13 NOTE — Telephone Encounter (Signed)
No answer for post procedure follow up call left VM. ?

## 2021-09-13 NOTE — Telephone Encounter (Signed)
  Follow up Call-  Call back number 09/09/2021 04/15/2021  Post procedure Call Back phone  # [phone]  Permission to leave phone message Yes Yes  Some recent data might be hidden     Patient questions:  Do you have a fever, pain , or abdominal swelling? No. Pain Score  0 *  Have you tolerated food without any problems? Yes.    Have you been able to return to your normal activities? Yes.    Do you have any questions about your discharge instructions: Diet   No. Medications  No. Follow up visit  No.  Do you have questions or concerns about your Care? No.  Actions: * If pain score is 4 or above: No action needed, pain <4.  Have you developed a fever since your procedure? no  2.   Have you had an respiratory symptoms (SOB or cough) since your procedure? no  3.   Have you tested positive for COVID 19 since your procedure no  4.   Have you had any family members/close contacts diagnosed with the COVID 19 since your procedure?  no   If yes to any of these questions please route to Joylene John, RN and Joella Prince, RN

## 2021-10-11 ENCOUNTER — Encounter: Payer: Self-pay | Admitting: Gastroenterology

## 2022-01-19 ENCOUNTER — Encounter: Payer: Self-pay | Admitting: Emergency Medicine

## 2022-02-08 ENCOUNTER — Encounter (HOSPITAL_COMMUNITY): Payer: Self-pay | Admitting: Physician Assistant

## 2022-02-08 ENCOUNTER — Ambulatory Visit (HOSPITAL_COMMUNITY)
Admission: EM | Admit: 2022-02-08 | Discharge: 2022-02-08 | Disposition: A | Discharge status: Home / Self Care | Payer: No Typology Code available for payment source | Attending: Physician Assistant | Admitting: Physician Assistant

## 2022-02-08 DIAGNOSIS — J020 Streptococcal pharyngitis: Secondary | ICD-10-CM

## 2022-02-08 DIAGNOSIS — J4521 Mild intermittent asthma with (acute) exacerbation: Secondary | ICD-10-CM

## 2022-02-08 DIAGNOSIS — Z8709 Personal history of other diseases of the respiratory system: Secondary | ICD-10-CM

## 2022-02-08 LAB — POCT RAPID STREP A, ED / UC: Streptococcus, Group A Screen (Direct): POSITIVE — AB

## 2022-02-08 MED ORDER — ALBUTEROL SULFATE HFA 108 (90 BASE) MCG/ACT IN AERS: 2.0000 | INHALATION_SPRAY | Freq: Four times a day (QID) | RESPIRATORY_TRACT | 0 refills | Status: DC | PRN

## 2022-02-08 MED ORDER — AMOXICILLIN 500 MG PO CAPS: 500.0000 mg | ORAL_CAPSULE | Freq: Two times a day (BID) | ORAL | 0 refills | Status: DC

## 2022-02-08 MED ORDER — PREDNISONE 20 MG PO TABS: 40.0000 mg | ORAL_TABLET | Freq: Every day | ORAL | 0 refills | Status: AC

## 2022-02-08 NOTE — Discharge Instructions (Addendum)
You are positive for strep.  Please start amoxicillin 500 mg twice daily.  He can gargle with warm salt water and use Tylenol for pain relief.  Throw your toothbrush in a few days after starting medication as you can get this in the future.  You are contagious for 24 hours after starting the medication.  I also believe that your asthma is flared.  I have sent in a refill of your albuterol.  Please also take prednisone burst.  Do not take NSAIDs including aspirin, ibuprofen/Advil, naproxen/Aleve with this medication.  If you have any worsening symptoms you need to be seen immediately. ?

## 2022-02-08 NOTE — ED Provider Notes (Signed)
?[place] ? ? ? ?CSN: [id number] ?Arrival date & time: 02/08/22  1741 ? ? ?  ? ?History   ?Chief Complaint ?Chief Complaint  ?Patient presents with  ? Laryngitis  ? ? ?HPI ?Chelsea Fernandez is a 31 y.o. female.  ? ?Patient presents today with a weeklong history of URI symptoms.  She reports sore throat, scratchy throat, hoarseness, congestion, cough.  Denies any fever, chest pain, shortness of breath, nausea, vomiting.  She has tried over-the-counter cold and flu medication as well as previously prescribed albuterol inhaler with only temporary relief of symptoms.  Denies any known sick contacts.  She is up-to-date on COVID-19 vaccines.  She has had COVID in August 2022 and in 2020.  She took 2 at-home COVID test that were negative.  She has a history of allergies and is not currently prescribed any medication for this.  She also has a history of asthma and has been using albuterol inhaler with temporary improvement of symptoms.  She denies any recent steroid or antibiotic use.  She is confident that she is not pregnant. ? ? ?Past Medical History:  ?Diagnosis Date  ? Acid reflux   ? Anxiety   ? ASCUS of cervix with negative high risk HPV   ? Asthma   ? CIN II (cervical intraepithelial neoplasia II)   ? Family history of adverse reaction to anesthesia   ? aunt has high tolerance to anesthesia  will not put her tosleep  ? Headache   ? migraine rarely  ? History of kidney stones   ? right renal ca dx'd 01/2018  ? Kidney cancer  ? ? ?Patient Active Problem List  ? Diagnosis Date Noted  ? Chronic heartburn 01/04/2021  ? Renal carcinoma, right ([facility]) 05/12/2020  ? Seasonal allergic rhinitis 03/10/2020  ? Renal mass 04/24/2018  ? 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 ASCUS with positive high risk HPV cervical 10/04/2016  ? Hyperlipidemia 09/05/2016  ? Fluctuation of weight 09/04/2016  ? ? ?Past Surgical History:  ?Procedure Laterality Date  ? DILATION AND CURETTAGE OF UTERUS  2012  ? NOSE SURGERY     ? ROBOTIC ASSITED PARTIAL NEPHRECTOMY Right 04/24/2018  ? Procedure: ROBOTIC ASSITED NEPHRECTOMY;  Surgeon: Ceasar Mons, MD;  Location: [facility];  Service: Urology;  Laterality: Right;  ? ? ?OB History   ? ? Gravida  ?1  ? Para  ?0  ? Term  ?0  ? Preterm  ?0  ? AB  ?1  ? Living  ?   ?  ? ? SAB  ?1  ? IAB  ?0  ? Ectopic  ?0  ? Multiple  ?   ? Live Births  ?   ?   ?  ?  ? ? ? ?Home Medications   ? ?Prior to Admission medications   ?Medication Sig Start Date End Date Taking? Authorizing Provider  ?amoxicillin (AMOXIL) 500 MG capsule Take 1 capsule (500 mg total) by mouth 2 (two) times daily. 02/08/22  Yes Norah Devin K, PA-C  ?predniSONE (DELTASONE) 20 MG tablet Take 2 tablets (40 mg total) by mouth daily for 5 days. 02/08/22 02/13/22 Yes Faye Sanfilippo K, PA-C  ?albuterol (VENTOLIN HFA) 108 (90 Base) MCG/ACT inhaler Inhale 2 puffs into the lungs every 6 (six) hours as needed for wheezing or shortness of breath. 02/08/22   Mohini Heathcock, Derry Skill, PA-C  ?benzonatate (TESSALON) 200 MG capsule Take 1 capsule (200 mg total) by mouth 2 (two) times  daily as needed for cough. 06/25/21   Horald Pollen, MD  ?ipratropium (ATROVENT) 0.06 % nasal spray Place 2 sprays into both nostrils in the morning and at bedtime. 06/10/20   Kennith Gain, MD  ?montelukast (SINGULAIR) 10 MG tablet Take 1 tablet (10 mg total) by mouth at bedtime. ?Patient not taking: Reported on 09/09/2021 12/10/20   Posey Boyer, MD  ?pantoprazole (PROTONIX) 40 MG tablet Take 1 tablet (40 mg total) by mouth 2 (two) times daily. 01/26/21   Thornton Park, MD  ? ? ?Family History ?Family History  ?Adopted: Yes  ?Problem Relation Age of Onset  ? Hypertension Maternal Aunt   ? Hypertension Maternal Uncle   ? Stomach cancer Maternal Grandmother   ? Cancer Maternal Grandmother   ?      STOMACH   ? Colon cancer Neg Hx   ? Esophageal cancer Neg Hx   ? Rectal cancer Neg Hx   ? ? ?Social History ?Social History  ? ?Tobacco Use  ? Smoking status: Never   ? Smokeless tobacco: Never  ?Vaping Use  ? Vaping Use: Never used  ?Substance Use Topics  ? Alcohol use: No  ? Drug use: No  ? ? ? ?Allergies   ?Other ? ? ?Review of Systems ?Review of Systems  ?Constitutional:  Positive for activity change and fatigue. Negative for appetite change and fever.  ?HENT:  Positive for congestion, sore throat and voice change. Negative for sinus pressure and sneezing.   ?Respiratory:  Positive for cough. Negative for shortness of breath.   ?Cardiovascular:  Negative for chest pain.  ?Gastrointestinal:  Negative for abdominal pain, diarrhea, nausea and vomiting.  ?Neurological:  Negative for dizziness, light-headedness and headaches.  ? ? ?Physical Exam ?Triage Vital Signs ?ED Triage Vitals  ?Enc Vitals Group  ?   BP 02/08/22 1929 (!) 127/91  ?   Pulse Rate 02/08/22 1929 96  ?   Resp 02/08/22 1929 18  ?   Temp 02/08/22 1929 98.4 ?F (36.9 ?C)  ?   Temp Source 02/08/22 1929 Oral  ?   SpO2 02/08/22 1929 100 %  ?   Weight --   ?   Height --   ?   Head Circumference --   ?   Peak Flow --   ?   Pain Score 02/08/22 1928 9  ?   Pain Loc --   ?   Pain Edu? --   ?   Excl. in [place]? --   ? ?No data found. ? ?Updated Vital Signs ?BP (!) 127/91 (BP Location: Right Arm)   Pulse 96   Temp 98.4 ?F (36.9 ?C) (Oral)   Resp 18   LMP 01/13/2022 (Exact Date)   SpO2 100%  ? ?Visual Acuity ?Right Eye Distance:   ?Left Eye Distance:   ?Bilateral Distance:   ? ?Right Eye Near:   ?Left Eye Near:    ?Bilateral Near:    ? ?Physical Exam ?Vitals reviewed.  ?Constitutional:   ?   General: She is awake. She is not in acute distress. ?   Appearance: Normal appearance. She is well-developed. She is not ill-appearing.  ?   Comments: Very pleasant female appears stated age in no acute distress sitting comfortably in exam room  ?HENT:  ?   Head: Normocephalic and atraumatic.  ?   Right Ear: Tympanic membrane, ear canal and external ear normal. Tympanic membrane is not erythematous or bulging.  ?   Left Ear: Tympanic  membrane, ear canal and external ear normal. Tympanic membrane is not erythematous or bulging.  ?   Nose:  ?   Right Sinus: No maxillary sinus tenderness or frontal sinus tenderness.  ?   Left Sinus: No maxillary sinus tenderness or frontal sinus tenderness.  ?   Mouth/Throat:  ?   Pharynx: Uvula midline. Posterior oropharyngeal erythema present. No oropharyngeal exudate.  ?   Tonsils: No tonsillar exudate or tonsillar abscesses. 2+ on the right. 2+ on the left.  ?Cardiovascular:  ?   Rate and Rhythm: Normal rate and regular rhythm.  ?   Heart sounds: Normal heart sounds, S1 normal and S2 normal. No murmur heard. ?Pulmonary:  ?   Effort: Pulmonary effort is normal.  ?   Breath sounds: Normal breath sounds. No wheezing, rhonchi or rales.  ?   Comments: Clear to auscultation bilaterally ?Lymphadenopathy:  ?   Head:  ?   Right side of head: No submental, submandibular or tonsillar adenopathy.  ?   Left side of head: No submental, submandibular or tonsillar adenopathy.  ?   Cervical: No cervical adenopathy.  ?Psychiatric:     ?   Behavior: Behavior is cooperative.  ? ? ? ?UC Treatments / Results  ?Labs ?(all labs ordered are listed, but only abnormal results are displayed) ?Labs Reviewed  ?POCT RAPID STREP A, ED / UC - Abnormal; Notable for the following components:  ?    Result Value  ? Streptococcus, Group A Screen (Direct) POSITIVE (*)   ? All other components within normal limits  ? ? ?EKG ? ? ?Radiology ?No results found. ? ?Procedures ?Procedures (including critical care time) ? ?Medications Ordered in UC ?Medications - No data to display ? ?Initial Impression / Assessment and Plan / UC Course  ?I have reviewed the triage vital signs and the nursing notes. ? ?Pertinent labs & imaging results that were available during my care of the patient were reviewed by me and considered in my medical decision making (see chart for details). ? ?  ? ?Strep test was positive in clinic today.  Patient was started on amoxicillin  twice daily.  Discussed that she should replace her toothbrush a few days after starting medication to prevent reinfection.  She is to gargle with warm salt water and use Tylenol for pain relief.  Discussed tha

## 2022-02-08 NOTE — ED Triage Notes (Signed)
Pt presents with c/o fatigue x 1 week, states she had painful and itchy swallowing.  ? ?States on Tuesday her throat felt worse, states she had ear pain and was coughing up yellow mucus at night.  ?

## 2022-02-13 ENCOUNTER — Encounter (INDEPENDENT_AMBULATORY_CARE_PROVIDER_SITE_OTHER): Payer: Self-pay

## 2022-02-16 NOTE — Telephone Encounter (Signed)
Replied back to pt email w/MD response. She will need to make an appt for reevaluation.Marland KitchenJohny Fernandez ?

## 2022-02-16 NOTE — Telephone Encounter (Signed)
Cannot answer this question.  I did not prescribe antibiotics and have not seen her in a while.

## 2022-07-31 ENCOUNTER — Other Ambulatory Visit: Payer: Self-pay | Admitting: Urology

## 2022-07-31 DIAGNOSIS — C641 Malignant neoplasm of right kidney, except renal pelvis: Secondary | ICD-10-CM

## 2022-08-24 ENCOUNTER — Ambulatory Visit
Admission: RE | Admit: 2022-08-24 | Discharge: 2022-08-24 | Disposition: A | Discharge status: Home / Self Care | Payer: Self-pay | Source: Ambulatory Visit | Attending: Urology | Admitting: Urology

## 2022-08-24 DIAGNOSIS — C641 Malignant neoplasm of right kidney, except renal pelvis: Secondary | ICD-10-CM

## 2022-08-24 MED ORDER — IOPAMIDOL (ISOVUE-300) INJECTION 61%
100.0000 mL | Freq: Once | INTRAVENOUS | Status: AC | PRN
  Administered 2022-08-24: 100 mL via INTRAVENOUS

## 2022-09-14 ENCOUNTER — Other Ambulatory Visit (HOSPITAL_COMMUNITY): Payer: Self-pay | Admitting: Urology

## 2022-09-14 ENCOUNTER — Ambulatory Visit (HOSPITAL_COMMUNITY)
Admission: RE | Admit: 2022-09-14 | Discharge: 2022-09-14 | Disposition: A | Discharge status: Home / Self Care | Payer: Self-pay | Source: Ambulatory Visit | Attending: Urology | Admitting: Urology

## 2022-09-14 DIAGNOSIS — C641 Malignant neoplasm of right kidney, except renal pelvis: Secondary | ICD-10-CM | POA: Insufficient documentation

## 2022-10-31 ENCOUNTER — Encounter: Payer: Self-pay | Admitting: Obstetrics & Gynecology

## 2022-10-31 ENCOUNTER — Other Ambulatory Visit (HOSPITAL_COMMUNITY)
Admission: RE | Admit: 2022-10-31 | Discharge: 2022-10-31 | Disposition: A | Discharge status: Home / Self Care | Payer: Self-pay | Source: Ambulatory Visit | Attending: Obstetrics & Gynecology | Admitting: Obstetrics & Gynecology

## 2022-10-31 ENCOUNTER — Ambulatory Visit (INDEPENDENT_AMBULATORY_CARE_PROVIDER_SITE_OTHER): Payer: Self-pay | Admitting: Obstetrics & Gynecology

## 2022-10-31 VITALS — BP 110/78 | HR 107 | Ht 59.5 in | Wt 165.0 lb

## 2022-10-31 DIAGNOSIS — Z113 Encounter for screening for infections with a predominantly sexual mode of transmission: Secondary | ICD-10-CM

## 2022-10-31 DIAGNOSIS — Z01419 Encounter for gynecological examination (general) (routine) without abnormal findings: Secondary | ICD-10-CM

## 2022-10-31 DIAGNOSIS — Z789 Other specified health status: Secondary | ICD-10-CM

## 2022-10-31 DIAGNOSIS — N871 Moderate cervical dysplasia: Secondary | ICD-10-CM

## 2022-10-31 NOTE — Progress Notes (Signed)
Chelsea Fernandez 1991/05/28 [id number]   History:    32 y.o. G1P0A1 Single  RP:  Established patient presenting for annual gyn exam   HPI:  Menses regular normal every month.  No BTB.  No pelvic pain . C/O occasional irritation. Sexually active without protection in 07/2022.  Menses reg normal since then, LMP 10/22/22.  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  Last Pap smear showed ASCUS/HPV HR Neg in 12/2019.  Pap 08/2018+ indicated normal cytology with negative HPV.   Pap/HPV HR today.  Full STI screen done.  Breasts normal.  BMI 32.77.  Health labs with Fam MD. Harriet Masson 03/2021.  Past medical history,surgical history, family history and social history were all reviewed and documented in the EPIC chart.  Gynecologic History Patient's last menstrual period was 10/22/2022 (exact date).  Obstetric History OB History  Gravida Para Term Preterm AB Living  1 0 0 0 1    SAB IAB Ectopic Multiple Live Births  1 0 0        # Outcome Date GA Lbr Len/2nd Weight Sex Delivery Anes PTL Lv  1 SAB              ROS: A ROS was performed and pertinent positives and negatives are included in the history. GENERAL: No fevers or chills. HEENT: No change in vision, no earache, sore throat or sinus congestion. NECK: No pain or stiffness. CARDIOVASCULAR: No chest pain or pressure. No palpitations. PULMONARY: No shortness of breath, cough or wheeze. GASTROINTESTINAL: No abdominal pain, nausea, vomiting or diarrhea, melena or bright red blood per rectum. GENITOURINARY: No urinary frequency, urgency, hesitancy or dysuria. MUSCULOSKELETAL: No joint or muscle pain, no back pain, no recent trauma. DERMATOLOGIC: No rash, no itching, no lesions. ENDOCRINE: No polyuria, polydipsia, no heat or cold intolerance. No recent change in weight. HEMATOLOGICAL: No anemia or easy bruising or bleeding. NEUROLOGIC: No headache, seizures,  numbness, tingling or weakness. PSYCHIATRIC: No depression, no loss of interest in normal activity or change in sleep pattern.     Exam:   BP 110/78   Pulse (!) 107   Ht 4' 11.5" (1.511 m)   Wt 165 lb (74.8 kg)   LMP 10/22/2022 (Exact Date)   SpO2 96%   BMI 32.77 kg/m   Body mass index is 32.77 kg/m.  General appearance : Well developed well nourished female. No acute distress HEENT: Eyes: no retinal hemorrhage or exudates,  Neck supple, trachea midline, no carotid bruits, no thyroidmegaly Lungs: Clear to auscultation, no rhonchi or wheezes, or rib retractions  Heart: Regular rate and rhythm, no murmurs or gallops Breast:Examined in sitting and supine position were symmetrical in appearance, no palpable masses or tenderness,  no skin retraction, no nipple inversion, no nipple discharge, no skin discoloration, no axillary or supraclavicular lymphadenopathy Abdomen: no palpable masses or tenderness, no rebound or guarding Extremities: no edema or skin discoloration or tenderness  Pelvic: Vulva: Normal             Vagina: No gross lesions or discharge  Cervix: No gross lesions or discharge.  Pap/HPV HR/Gono-Chlam done.  Uterus  AV, normal size, shape and consistency, non-tender and mobile  Adnexa  Without masses or tenderness  Anus: Normal   Assessment/Plan:  32 y.o. female for annual exam   1. Encounter for routine gynecological examination with Papanicolaou smear of cervix Menses regular normal every month.  No BTB.  No  pelvic pain . C/O occasional irritation. Sexually active without protection in 07/2022.  Menses reg normal since then, LMP 10/22/22.  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  Last Pap smear showed ASCUS/HPV HR Neg in 12/2019.  Pap 08/2018+ indicated normal cytology with negative HPV.   Pap/HPV HR today.  Full STI screen done.  Breasts normal.  BMI 32.77.  Health labs with Fam  MD. Harriet Masson 03/2021.  Questions answered about eventual decrease in fertility with AMA. - Cytology - PAP( [place])  2. Dysplasia of cervix, high grade CIN 2 - Cytology - PAP( [place])  3. Screen for STD (sexually transmitted disease) - HIV antibody (with reflex) - RPR - Hepatitis B Surface AntiGEN - Hepatitis C Antibody - Cytology - PAP( [place])  4. Use of condoms for contraception  Declines alternative contraceptives.  Princess Bruins MD, 3:08 PM

## 2022-11-01 LAB — HEPATITIS B SURFACE ANTIGEN: Hepatitis B Surface Ag: NONREACTIVE

## 2022-11-01 LAB — HIV ANTIBODY (ROUTINE TESTING W REFLEX): HIV 1&2 Ab, 4th Generation: NONREACTIVE

## 2022-11-01 LAB — HEPATITIS C ANTIBODY: Hepatitis C Ab: NONREACTIVE

## 2022-11-01 LAB — RPR: RPR Ser Ql: NONREACTIVE

## 2022-11-02 LAB — CYTOLOGY - PAP
Chlamydia: NEGATIVE
Comment: NEGATIVE
Comment: NEGATIVE
Comment: NORMAL
High risk HPV: NEGATIVE
Neisseria Gonorrhea: NEGATIVE

## 2022-11-09 ENCOUNTER — Ambulatory Visit (HOSPITAL_COMMUNITY)
Admission: EM | Admit: 2022-11-09 | Discharge: 2022-11-09 | Disposition: A | Discharge status: Home / Self Care | Payer: No Typology Code available for payment source | Attending: Urgent Care | Admitting: Urgent Care

## 2022-11-09 ENCOUNTER — Encounter (HOSPITAL_COMMUNITY): Payer: Self-pay | Admitting: Emergency Medicine

## 2022-11-09 ENCOUNTER — Other Ambulatory Visit: Payer: Self-pay

## 2022-11-09 DIAGNOSIS — J0100 Acute maxillary sinusitis, unspecified: Secondary | ICD-10-CM

## 2022-11-09 DIAGNOSIS — H6593 Unspecified nonsuppurative otitis media, bilateral: Secondary | ICD-10-CM

## 2022-11-09 DIAGNOSIS — R051 Acute cough: Secondary | ICD-10-CM

## 2022-11-09 MED ORDER — BUDESONIDE 32 MCG/ACT NA SUSP: 2.0000 | Freq: Every day | NASAL | 0 refills | Status: DC

## 2022-11-09 MED ORDER — BENZONATATE 100 MG PO CAPS: 100.0000 mg | ORAL_CAPSULE | Freq: Three times a day (TID) | ORAL | 0 refills | Status: DC | PRN

## 2022-11-09 MED ORDER — AMOXICILLIN-POT CLAVULANATE 875-125 MG PO TABS: 1.0000 | ORAL_TABLET | Freq: Two times a day (BID) | ORAL | 0 refills | Status: AC

## 2022-11-09 MED ORDER — PREDNISONE 50 MG PO TABS: 50.0000 mg | ORAL_TABLET | Freq: Every day | ORAL | 0 refills | Status: AC

## 2022-11-09 NOTE — Discharge Instructions (Addendum)
We are treating you for sinusitis which is likely the cause of your cough and ear pain as well.  Please start taking the antibiotic, Augmentin, twice daily with food. Take it for all 10 days, do not stop early just because you feel better. Take an over the counter probiotic or yogurt daily to help prevent diarrhea/ yeast infection.  Start taking prednisone once daily in the morning.  Use Budesonide nasal spray daily to help with inflammation of the nasal passage.  It is also recommended that you use nasal saline/ sinus washes to cleans the sinus passages. Hot steam from a shower or vaporizer may also be beneficial to help open up the upper airway. Eucalyptus can be helpful.  Only use the cough medication if having trouble performing your job due to the cough.  If any worsening symptoms such as headache, fever, or shortness of breath, please return for recheck.

## 2022-11-09 NOTE — ED Triage Notes (Addendum)
10/30/2022 started with sore throat , cough, suspects having a fever and chills  Has had nyquil.  After Tuesday, symptoms worsened and now ears are hurting and has yellow mucous

## 2022-11-09 NOTE — ED Provider Notes (Signed)
[facility]    CSN: [id number] Arrival date & time: 11/09/22  1810      History   Chief Complaint Chief Complaint  Patient presents with   Cough    HPI Chelsea Fernandez is a 32 y.o. female.   32 year old female presents due to concerns of a cough, sore throat, sinus pain, and ear pain.  She states it started on January 1.  She had symptoms of the flu such as fever and bodyaches at initiation of symptoms, but that is since resolved.  She been taking numerous over-the-counter medications and states that her sore throat seem to improve, but 2 to 3 days ago it came back more severe.  She also reports pressure in her ears and her maxillary sinuses.  She stated at 1 point she had trouble smelling through her nose, but this has improved some.  Also reports a cough productive of yellow phlegm.  Denies any shortness of breath or chest pain.  Has been taking numerous over-the-counter medications without improvement.  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   Cough   Past Medical History:  Diagnosis Date   Acid reflux    Anxiety    ASCUS of cervix with negative high risk HPV    Asthma    CIN II (cervical intraepithelial neoplasia II)    Family history of adverse reaction to anesthesia    aunt has high tolerance to anesthesia  will not put her tosleep   Headache    migraine rarely   History of kidney stones    right renal ca dx'd 01/2018   Kidney cancer    Patient Active Problem List   Diagnosis Date Noted   Chronic heartburn 01/04/2021   Renal carcinoma, right ([place]) 05/12/2020   Seasonal allergic rhinitis 03/10/2020   Renal mass 04/24/2018   Dysplasia of cervix, 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   ASCUS with positive high risk HPV cervical 10/04/2016   Hyperlipidemia 09/05/2016   Fluctuation of weight 09/04/2016    Past Surgical History:  Procedure Laterality Date   DILATION AND CURETTAGE OF UTERUS  2012    NOSE SURGERY     ROBOTIC ASSITED PARTIAL NEPHRECTOMY Right 04/24/2018   Procedure: ROBOTIC ASSITED NEPHRECTOMY;  Surgeon: Ceasar Mons, MD;  Location: [facility];  Service: Urology;  Laterality: Right;    OB History     Gravida  1   Para  0   Term  0   Preterm  0   AB  1   Living         SAB  1   IAB  0   Ectopic  0   Multiple      Live Births               Home Medications    Prior to Admission medications   Medication Sig Start Date End Date Taking? Authorizing Provider  amoxicillin-clavulanate (AUGMENTIN) 875-125 MG tablet Take 1 tablet by mouth 2 (two) times daily with a meal for 10 days. 11/09/22 11/19/22 Yes Elohim Brune L, PA  benzonatate (TESSALON) 100 MG capsule Take 1 capsule (100 mg total) by mouth 3 (three) times daily as needed for cough. 11/09/22  Yes Oneal Biglow L, PA  budesonide (RHINOCORT AQUA) 32 MCG/ACT nasal spray Place 2 sprays into both nostrils daily. 11/09/22  Yes Isaih Bulger L, PA  predniSONE (DELTASONE) 50 MG tablet Take 1 tablet (50  mg total) by mouth daily with breakfast for 3 days. 11/09/22 11/12/22 Yes Tomie Spizzirri L, PA  albuterol (VENTOLIN HFA) 108 (90 Base) MCG/ACT inhaler Inhale 2 puffs into the lungs every 6 (six) hours as needed for wheezing or shortness of breath. 02/08/22   Raspet, Derry Skill, PA-C    Family History Family History  Adopted: Yes  Problem Relation Age of Onset   Hypertension Mother    Stomach cancer Maternal Grandmother    Cancer Maternal Grandmother         STOMACH    Hypertension Maternal Aunt    Hypertension Maternal Uncle    Colon cancer Neg Hx    Esophageal cancer Neg Hx    Rectal cancer Neg Hx     Social History Social History   Tobacco Use   Smoking status: Never   Smokeless tobacco: Never  Vaping Use   Vaping Use: Never used  Substance Use Topics   Alcohol use: No   Drug use: No     Allergies   Other   Review of Systems Review of Systems  Respiratory:  Positive for  cough.   As per HPI   Physical Exam Triage Vital Signs ED Triage Vitals  Enc Vitals Group     BP 11/09/22 1948 129/84     Pulse Rate 11/09/22 1948 88     Resp 11/09/22 1948 18     Temp 11/09/22 1948 98.2 F (36.8 C)     Temp Source 11/09/22 1948 Oral     SpO2 11/09/22 1948 98 %     Weight --      Height --      Head Circumference --      Peak Flow --      Pain Score 11/09/22 1946 8     Pain Loc --      Pain Edu? --      Excl. in [place]? --    No data found.  Updated Vital Signs BP 129/84 (BP Location: Right Arm)   Pulse 88   Temp 98.2 F (36.8 C) (Oral)   Resp 18   LMP 10/22/2022 (Exact Date)   SpO2 98%   Visual Acuity Right Eye Distance:   Left Eye Distance:   Bilateral Distance:    Right Eye Near:   Left Eye Near:    Bilateral Near:     Physical Exam Vitals and nursing note reviewed.  Constitutional:      General: She is not in acute distress.    Appearance: Normal appearance. She is not ill-appearing, toxic-appearing or diaphoretic.  HENT:     Head: Normocephalic and atraumatic.     Right Ear: No swelling. A middle ear effusion is present. Tympanic membrane is injected. Tympanic membrane is not bulging.     Left Ear: No swelling. A middle ear effusion is present. Tympanic membrane is injected. Tympanic membrane is not bulging.     Nose: Congestion and rhinorrhea present. Rhinorrhea is purulent.     Right Turbinates: Enlarged and swollen.     Left Turbinates: Not enlarged or swollen.     Right Sinus: Maxillary sinus tenderness present. No frontal sinus tenderness.     Left Sinus: Maxillary sinus tenderness present. No frontal sinus tenderness.     Mouth/Throat:     Lips: Pink.     Mouth: Mucous membranes are moist. No oral lesions.     Pharynx: Oropharynx is clear. Uvula midline. Posterior oropharyngeal erythema present. No pharyngeal swelling, oropharyngeal exudate or  uvula swelling.     Tonsils: No tonsillar exudate or tonsillar abscesses.   Cardiovascular:     Rate and Rhythm: Normal rate and regular rhythm.  Pulmonary:     Effort: Pulmonary effort is normal. No respiratory distress.     Breath sounds: Normal breath sounds. No stridor. No wheezing, rhonchi or rales.  Chest:     Chest wall: No tenderness.  Musculoskeletal:     Cervical back: Normal range of motion and neck supple. No rigidity or tenderness.  Lymphadenopathy:     Cervical: No cervical adenopathy.  Skin:    General: Skin is warm and dry.     Capillary Refill: Capillary refill takes less than 2 seconds.     Findings: No erythema or rash.  Neurological:     General: No focal deficit present.     Mental Status: She is alert and oriented to person, place, and time.      UC Treatments / Results  Labs (all labs ordered are listed, but only abnormal results are displayed) Labs Reviewed - No data to display  EKG   Radiology No results found.  Procedures Procedures (including critical care time)  Medications Ordered in UC Medications - No data to display  Initial Impression / Assessment and Plan / UC Course  I have reviewed the triage vital signs and the nursing notes.  Pertinent labs & imaging results that were available during my care of the patient were reviewed by me and considered in my medical decision making (see chart for details).     Acute maxillary sinusitis -patient with symptoms concerning for bacterial sinusitis.  Will start Augmentin twice daily x 10 days which will also help with patient's ears.  Additional supportive measures such as humidification and saline recommended. Otitis media with effusion -will add prednisone and budesonide nasal spray to help with clearing the fluid and pressure behind her bilateral ears. Acute cough-likely secondary to drainage.  Vital signs stable and lungs are clear.  Patient is requesting by name Ladona Ridgel to help her get through her work shift due to the cough.  Final Clinical Impressions(s)  / UC Diagnoses   Final diagnoses:  Acute non-recurrent maxillary sinusitis  Otitis media with effusion, bilateral  Acute cough     Discharge Instructions      We are treating you for sinusitis which is likely the cause of your cough and ear pain as well.  Please start taking the antibiotic, Augmentin, twice daily with food. Take it for all 10 days, do not stop early just because you feel better. Take an over the counter probiotic or yogurt daily to help prevent diarrhea/ yeast infection.  Start taking prednisone once daily in the morning.  Use Budesonide nasal spray daily to help with inflammation of the nasal passage.  It is also recommended that you use nasal saline/ sinus washes to cleans the sinus passages. Hot steam from a shower or vaporizer may also be beneficial to help open up the upper airway. Eucalyptus can be helpful.  Only use the cough medication if having trouble performing your job due to the cough.  If any worsening symptoms such as headache, fever, or shortness of breath, please return for recheck.    ED Prescriptions     Medication Sig Dispense Auth. Provider   amoxicillin-clavulanate (AUGMENTIN) 875-125 MG tablet Take 1 tablet by mouth 2 (two) times daily with a meal for 10 days. 20 tablet Effa Yarrow L, PA   budesonide (RHINOCORT AQUA) 32  MCG/ACT nasal spray Place 2 sprays into both nostrils daily. 8.43 mL Aleja Yearwood L, PA   benzonatate (TESSALON) 100 MG capsule Take 1 capsule (100 mg total) by mouth 3 (three) times daily as needed for cough. 21 capsule Anna Beaird L, PA   predniSONE (DELTASONE) 50 MG tablet Take 1 tablet (50 mg total) by mouth daily with breakfast for 3 days. 3 tablet Ariyon Gerstenberger L, PA      PDMP not reviewed this encounter.   Chaney Malling, [place] 11/09/22 2025

## 2022-11-20 ENCOUNTER — Telehealth (HOSPITAL_COMMUNITY): Payer: Self-pay | Admitting: Emergency Medicine

## 2022-11-20 MED ORDER — BUDESONIDE 32 MCG/ACT NA SUSP: 2.0000 | Freq: Every day | NASAL | 0 refills | Status: DC

## 2022-11-28 ENCOUNTER — Ambulatory Visit (INDEPENDENT_AMBULATORY_CARE_PROVIDER_SITE_OTHER): Payer: Self-pay | Admitting: Emergency Medicine

## 2022-11-28 ENCOUNTER — Encounter: Payer: Self-pay | Admitting: Emergency Medicine

## 2022-11-28 VITALS — BP 106/70 | HR 73 | Temp 98.6°F | Ht 59.5 in | Wt 164.1 lb

## 2022-11-28 DIAGNOSIS — J0100 Acute maxillary sinusitis, unspecified: Secondary | ICD-10-CM | POA: Insufficient documentation

## 2022-11-28 DIAGNOSIS — H6691 Otitis media, unspecified, right ear: Secondary | ICD-10-CM | POA: Insufficient documentation

## 2022-11-28 MED ORDER — AZITHROMYCIN 250 MG PO TABS: ORAL_TABLET | ORAL | 0 refills | Status: DC

## 2022-11-28 MED ORDER — PREDNISONE 20 MG PO TABS: 40.0000 mg | ORAL_TABLET | Freq: Every day | ORAL | 0 refills | Status: AC

## 2022-11-28 NOTE — Progress Notes (Signed)
Chelsea Fernandez 32 y.o.   Chief Complaint  Patient presents with   Acute Visit    Sore throat, ear pain , cough x 3-4 weeks     HISTORY OF PRESENT ILLNESS: Acute problem visit today. This is a 32 y.o. female with flulike symptoms for 4 weeks. Was seen at urgent care center about 3 weeks ago and diagnosed with sinus infection and bilateral otitis media Treated with Augmentin, prednisone, Tessalon and corticosteroid nasal spray.  Felt slightly better Symptoms still persistent, mostly right ear pain, cough and sore throat No other associated symptoms No other complaints or medical concerns today.  HPI   Prior to Admission medications   Medication Sig Start Date End Date Taking? Authorizing Provider  albuterol (VENTOLIN HFA) 108 (90 Base) MCG/ACT inhaler Inhale 2 puffs into the lungs every 6 (six) hours as needed for wheezing or shortness of breath. 02/08/22   Raspet, Derry Skill, PA-C  benzonatate (TESSALON) 100 MG capsule Take 1 capsule (100 mg total) by mouth 3 (three) times daily as needed for cough. 11/09/22   Crain, Whitney L, PA  budesonide (RHINOCORT AQUA) 32 MCG/ACT nasal spray Place 2 sprays into both nostrils daily. 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   ASCUS with positive high risk HPV cervical 10/04/2016   Hyperlipidemia 09/05/2016   Fluctuation of weight 09/04/2016    Past Medical History:  Diagnosis Date   Acid reflux    Anxiety    ASCUS of cervix with negative high risk HPV    Asthma    CIN II (cervical intraepithelial neoplasia II)    Family history of adverse reaction to anesthesia    aunt has high tolerance to anesthesia  will not put her tosleep   Headache     migraine rarely   History of kidney stones    right renal ca dx'd 01/2018   Kidney cancer    Past Surgical History:  Procedure Laterality Date   DILATION AND CURETTAGE OF UTERUS  2012   NOSE SURGERY     ROBOTIC ASSITED PARTIAL NEPHRECTOMY Right 04/24/2018   Procedure: ROBOTIC ASSITED NEPHRECTOMY;  Surgeon: Ceasar Mons, MD;  Location: [facility];  Service: Urology;  Laterality: Right;    Social History   Socioeconomic History   Marital status: Single    Spouse name: Not on file   Number of children: Not on file   Years of education: Not on file   Highest education level: Not on file  Occupational History   Not on file  Tobacco Use   Smoking status: Never   Smokeless tobacco: Never  Vaping Use   Vaping Use: Never used  Substance and Sexual Activity   Alcohol use: No   Drug use: No   Sexual activity: Not Currently    Partners: Male    Birth control/protection: Abstinence    Comment: 1ST INTERCOURSE- 17, Fewer than 5 partners  Other Topics Concern   Not on file  Social History Narrative   ** Merged History Encounter **       Social Determinants of Health   Financial Resource Strain: Not on file  Food Insecurity: Not  on file  Transportation Needs: Not on file  Physical Activity: Not on file  Stress: Not on file  Social Connections: Not on file  Intimate Partner Violence: Not on file    Family History  Adopted: Yes  Problem Relation Age of Onset   Hypertension Mother    Stomach cancer Maternal Grandmother    Cancer Maternal Grandmother         STOMACH    Hypertension Maternal Aunt    Hypertension Maternal Uncle    Colon cancer Neg Hx    Esophageal cancer Neg Hx    Rectal cancer Neg Hx      Review of Systems  Constitutional: Negative.  Negative for chills and fever.  HENT:  Positive for congestion, ear pain and sore throat.   Respiratory:  Positive for cough. Negative for shortness of breath.   Cardiovascular: Negative.  Negative for chest pain  and palpitations.  Gastrointestinal:  Negative for abdominal pain, diarrhea, nausea and vomiting.  Genitourinary: Negative.   Skin: Negative.  Negative for rash.  Neurological: Negative.  Negative for dizziness and headaches.  All other systems reviewed and are negative.   Today's Vitals   11/28/22 1608  BP: 106/70  Pulse: 73  Temp: 98.6 F (37 C)  TempSrc: Oral  SpO2: 98%  Weight: 164 lb 2 oz (74.4 kg)  Height: 4' 11.5" (1.511 m)   Body mass index is 32.59 kg/m.  Physical Exam Vitals reviewed.  Constitutional:      Appearance: Normal appearance.  HENT:     Head: Normocephalic.     Right Ear: Ear canal and external ear normal.     Left Ear: Tympanic membrane, ear canal and external ear normal.     Ears:     Comments: Right-sided hyperemic eardrum    Mouth/Throat:     Mouth: Mucous membranes are moist.     Pharynx: Oropharynx is clear. Posterior oropharyngeal erythema present. No oropharyngeal exudate.  Eyes:     Extraocular Movements: Extraocular movements intact.     Conjunctiva/sclera: Conjunctivae normal.     Pupils: Pupils are equal, round, and reactive to light.  Cardiovascular:     Rate and Rhythm: Normal rate and regular rhythm.     Pulses: Normal pulses.     Heart sounds: Normal heart sounds.  Pulmonary:     Effort: Pulmonary effort is normal.     Breath sounds: Normal breath sounds.  Musculoskeletal:     Cervical back: No tenderness.  Lymphadenopathy:     Cervical: No cervical adenopathy.  Skin:    General: Skin is warm and dry.  Neurological:     General: No focal deficit present.     Mental Status: She is alert and oriented to person, place, and time.  Psychiatric:        Mood and Affect: Mood normal.        Behavior: Behavior normal.      ASSESSMENT & PLAN: A total of 34 minutes was spent with the patient and counseling/coordination of care regarding preparing for this visit, review of most recent office visit notes, review of most recent  urgent care center visit notes, review of all medications, diagnosis of bacterial ear and sinus infection and need for antibiotics, pain management, prognosis, documentation, need for follow-up if no better or worse during the next several days..  Problem List Items Addressed This Visit       Respiratory   Acute non-recurrent maxillary sinusitis    Partially treated.  Started  and finished 7-day course of Augmentin. Symptoms mostly localized to right side. Advised to use saline nasal sprays frequently during the day Will benefit from daily azithromycin for 5 days. May use over-the-counter decongestant as needed      Relevant Medications   predniSONE (DELTASONE) 20 MG tablet   azithromycin (ZITHROMAX) 250 MG tablet     Nervous and Auditory   Acute right otitis media - Primary    Partially treated.  Will benefit from daily azithromycin for 5 days. Pain management discussed. May take Tylenol and or Advil as needed for pain.      Relevant Medications   predniSONE (DELTASONE) 20 MG tablet   azithromycin (ZITHROMAX) 250 MG tablet   Patient Instructions  Sinus Infection, Adult A sinus infection is soreness and swelling (inflammation) of your sinuses. Sinuses are hollow spaces in the bones around your face. They are located: Around your eyes. In the middle of your forehead. Behind your nose. In your cheekbones. Your sinuses and nasal passages are lined with a fluid called mucus. Mucus drains out of your sinuses. Swelling can trap mucus in your sinuses. This lets germs (bacteria, virus, or fungus) grow, which leads to infection. Most of the time, this condition is caused by a virus. What are the causes? Allergies. Asthma. Germs. Things that block your nose or sinuses. Growths in the nose (nasal polyps). Chemicals or irritants in the air. A fungus. This is rare. What increases the risk? Having a weak body defense system (immune system). Doing a lot of swimming or diving. Using  nasal sprays too much. Smoking. What are the signs or symptoms? The main symptoms of this condition are pain and a feeling of pressure around the sinuses. Other symptoms include: Stuffy nose (congestion). This may make it hard to breathe through your nose. Runny nose (drainage). Soreness, swelling, and warmth in the sinuses. A cough that may get worse at night. Being unable to smell and taste. Mucus that collects in the throat or the back of the nose (postnasal drip). This may cause a sore throat or bad breath. Being very tired (fatigued). A fever. How is this diagnosed? Your symptoms. Your medical history. A physical exam. Tests to find out if your condition is short-term (acute) or long-term (chronic). Your doctor may: Check your nose for growths (polyps). Check your sinuses using a tool that has a light on one end (endoscope). Check for allergies or germs. Do imaging tests, such as an MRI or CT scan. How is this treated? Treatment for this condition depends on the cause and whether it is short-term or long-term. If caused by a virus, your symptoms should go away on their own within 10 days. You may be given medicines to relieve symptoms. They include: Medicines that shrink swollen tissue in the nose. A spray that treats swelling of the nostrils. Rinses that help get rid of thick mucus in your nose (nasal saline washes). Medicines that treat allergies (antihistamines). Over-the-counter pain relievers. If caused by bacteria, your doctor may wait to see if you will get better without treatment. You may be given antibiotic medicine if you have: A very bad infection. A weak body defense system. If caused by growths in the nose, surgery may be needed. Follow these instructions at home: Medicines Take, use, or apply over-the-counter and prescription medicines only as told by your doctor. These may include nasal sprays. If you were prescribed an antibiotic medicine, take it as told by  your doctor. Do not stop taking it  even if you start to feel better. Hydrate and humidify  Drink enough water to keep your pee (urine) pale yellow. Use a cool mist humidifier to keep the humidity level in your home above 50%. Breathe in steam for 10-15 minutes, 3-4 times a day, or as told by your doctor. You can do this in the bathroom while a hot shower is running. Try not to spend time in cool or dry air. Rest Rest as much as you can. Sleep with your head raised (elevated). Make sure you get enough sleep each night. General instructions  Put a warm, moist washcloth on your face 3-4 times a day, or as often as told by your doctor. Use nasal saline washes as often as told by your doctor. Wash your hands often with soap and water. If you cannot use soap and water, use hand sanitizer. Do not smoke. Avoid being around people who are smoking (secondhand smoke). Keep all follow-up visits. Contact a doctor if: You have a fever. Your symptoms get worse. Your symptoms do not get better within 10 days. Get help right away if: You have a very bad headache. You cannot stop vomiting. You have very bad pain or swelling around your face or eyes. You have trouble seeing. You feel confused. Your neck is stiff. You have trouble breathing. These symptoms may be an emergency. Get help right away. Call 911. Do not wait to see if the symptoms will go away. Do not drive yourself to the hospital. Summary A sinus infection is swelling of your sinuses. Sinuses are hollow spaces in the bones around your face. This condition is caused by tissues in your nose that become inflamed or swollen. This traps germs. These can lead to infection. If you were prescribed an antibiotic medicine, take it as told by your doctor. Do not stop taking it even if you start to feel better. Keep all follow-up visits. This information is not intended to replace advice given to you by your health care provider. Make sure you  discuss any questions you have with your health care provider. Document Revised: 09/20/2021 Document Reviewed: 09/20/2021 Elsevier Patient Education  2023 [place], MD [facility]

## 2022-11-28 NOTE — Assessment & Plan Note (Signed)
Partially treated.  Will benefit from daily azithromycin for 5 days. Pain management discussed. May take Tylenol and or Advil as needed for pain.

## 2022-11-28 NOTE — Patient Instructions (Signed)

## 2022-11-28 NOTE — Assessment & Plan Note (Signed)
Partially treated.  Started and finished 7-day course of Augmentin. Symptoms mostly localized to right side. Advised to use saline nasal sprays frequently during the day Will benefit from daily azithromycin for 5 days. May use over-the-counter decongestant as needed

## 2022-12-07 ENCOUNTER — Other Ambulatory Visit: Payer: Self-pay

## 2022-12-07 DIAGNOSIS — R87612 Low grade squamous intraepithelial lesion on cytologic smear of cervix (LGSIL): Secondary | ICD-10-CM

## 2022-12-11 ENCOUNTER — Encounter: Payer: Self-pay | Admitting: Emergency Medicine

## 2022-12-12 NOTE — Telephone Encounter (Signed)
No concerns.  Could be a viral rash.  If it gets worse, need to be seen.  Thanks.

## 2023-01-02 ENCOUNTER — Encounter: Payer: Self-pay | Admitting: Emergency Medicine

## 2023-01-02 ENCOUNTER — Ambulatory Visit (INDEPENDENT_AMBULATORY_CARE_PROVIDER_SITE_OTHER): Payer: Self-pay | Admitting: Emergency Medicine

## 2023-01-02 VITALS — BP 116/76 | HR 66 | Temp 98.1°F | Ht 59.5 in | Wt 158.0 lb

## 2023-01-02 DIAGNOSIS — L239 Allergic contact dermatitis, unspecified cause: Secondary | ICD-10-CM | POA: Insufficient documentation

## 2023-01-02 LAB — COMPREHENSIVE METABOLIC PANEL
ALT: 17 U/L (ref 0–35)
AST: 18 U/L (ref 0–37)
Albumin: 4.2 g/dL (ref 3.5–5.2)
Alkaline Phosphatase: 79 U/L (ref 39–117)
BUN: 10 mg/dL (ref 6–23)
CO2: 26 mEq/L (ref 19–32)
Calcium: 10.2 mg/dL (ref 8.4–10.5)
Chloride: 102 mEq/L (ref 96–112)
Creatinine, Ser: 0.71 mg/dL (ref 0.40–1.20)
GFR: 112.29 mL/min (ref >60.00)
Glucose, Bld: 80 mg/dL (ref 70–99)
Potassium: 4.1 mEq/L (ref 3.5–5.1)
Sodium: 138 mEq/L (ref 135–145)
Total Bilirubin: 0.5 mg/dL (ref 0.2–1.2)
Total Protein: 7.5 g/dL (ref 6.0–8.3)

## 2023-01-02 LAB — CBC WITH DIFFERENTIAL/PLATELET
Basophils Absolute: 0.1 10*3/uL (ref 0.0–0.1)
Basophils Relative: 0.4 % (ref 0.0–3.0)
Eosinophils Absolute: 0.1 10*3/uL (ref 0.0–0.7)
Eosinophils Relative: 0.7 % (ref 0.0–5.0)
HCT: 42.3 % (ref 36.0–46.0)
Hemoglobin: 14.1 g/dL (ref 12.0–15.0)
Lymphocytes Relative: 17.7 % (ref 12.0–46.0)
Lymphs Abs: 2.2 10*3/uL (ref 0.7–4.0)
MCHC: 33.3 g/dL (ref 30.0–36.0)
MCV: 82.8 fl (ref 78.0–100.0)
Monocytes Absolute: 0.7 10*3/uL (ref 0.1–1.0)
Monocytes Relative: 5.7 % (ref 3.0–12.0)
Neutro Abs: 9.3 10*3/uL — ABNORMAL HIGH (ref 1.4–7.7)
Neutrophils Relative %: 75.5 % (ref 43.0–77.0)
Platelets: 289 10*3/uL (ref 150.0–400.0)
RBC: 5.11 Mil/uL (ref 3.87–5.11)
RDW: 13.8 % (ref 11.5–15.5)
WBC: 12.3 10*3/uL — ABNORMAL HIGH (ref 4.0–10.5)

## 2023-01-02 LAB — URINALYSIS
Bilirubin Urine: NEGATIVE
Ketones, ur: NEGATIVE
Leukocytes,Ua: NEGATIVE
Nitrite: NEGATIVE
Specific Gravity, Urine: >=1.03 — AB (ref 1.000–1.030)
Total Protein, Urine: NEGATIVE
Urine Glucose: NEGATIVE
Urobilinogen, UA: 0.2 (ref 0.0–1.0)
pH: 6 (ref 5.0–8.0)

## 2023-01-02 LAB — VITAMIN D 25 HYDROXY (VIT D DEFICIENCY, FRACTURES): VITD: 13.89 ng/mL — ABNORMAL LOW (ref 30.00–100.00)

## 2023-01-02 LAB — VITAMIN B12: Vitamin B-12: 493 pg/mL (ref 211–911)

## 2023-01-02 LAB — TSH: TSH: 1.43 u[IU]/mL (ref 0.35–5.50)

## 2023-01-02 NOTE — Progress Notes (Signed)
Chelsea Fernandez 32 y.o.   Chief Complaint  Patient presents with   Acute Visit    Rash on face and chest , patient states she took ABX and thinks its a reaction     HISTORY OF PRESENT ILLNESS: This is a 32 y.o. female complaining of itchy rash to face, neck, upper back and upper chest on and off for the past couple weeks. Not sure if it is related to antibiotic she took 5 to 6 weeks ago. No other associated symptoms. Recently started eating better.  Thinks rash is related to either nuts or soy sauce.  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   ASCUS with positive high risk HPV cervical 10/04/2016   Hyperlipidemia 09/05/2016   Fluctuation of weight 09/04/2016    Past Medical History:  Diagnosis Date   Acid reflux    Anxiety    ASCUS of cervix with negative high risk HPV    Asthma    CIN II (cervical intraepithelial neoplasia II)    Family history of adverse reaction to anesthesia    aunt has high tolerance to anesthesia  will not put her tosleep   Headache    migraine rarely   History of kidney stones    right renal ca dx'd 01/2018   Kidney cancer    Past Surgical History:  Procedure Laterality Date   DILATION AND CURETTAGE OF UTERUS  2012   NOSE SURGERY     ROBOTIC ASSITED PARTIAL NEPHRECTOMY Right 04/24/2018   Procedure: ROBOTIC ASSITED NEPHRECTOMY;  Surgeon: Ceasar Mons, MD;  Location: [facility];  Service: Urology;  Laterality: Right;    Social History   Socioeconomic History   Marital status: Single    Spouse name: Not on file   Number of  children: Not on file   Years of education: Not on file   Highest education level: Not on file  Occupational History   Not on file  Tobacco Use   Smoking status: Never   Smokeless tobacco: Never  Vaping Use   Vaping Use: Never used  Substance and Sexual Activity   Alcohol use: No   Drug use: No   Sexual activity: Not Currently    Partners: Male    Birth control/protection: Abstinence    Comment: 1ST INTERCOURSE- 17, Fewer than 5 partners  Other Topics Concern   Not on file  Social History Narrative   ** Merged History Encounter **       Social Determinants of Health   Financial Resource Strain: Not on file  Food Insecurity: Not on file  Transportation Needs: Not on file  Physical Activity: Not on file  Stress: Not on file  Social Connections: Not on file  Intimate Partner Violence: Not on file    Family History  Adopted: Yes  Problem Relation Age of Onset   Hypertension Mother    Stomach cancer Maternal Grandmother    Cancer Maternal Grandmother         STOMACH    Hypertension Maternal Aunt  Hypertension Maternal Uncle    Colon cancer Neg Hx    Esophageal cancer Neg Hx    Rectal cancer Neg Hx      Review of Systems  Constitutional: Negative.  Negative for chills and fever.  HENT: Negative.  Negative for congestion and sore throat.   Respiratory: Negative.  Negative for cough and shortness of breath.   Cardiovascular: Negative.  Negative for chest pain and palpitations.  Gastrointestinal:  Negative for abdominal pain, diarrhea, nausea and vomiting.  Genitourinary: Negative.  Negative for dysuria and hematuria.  Skin: Negative.  Negative for rash.  Neurological: Negative.  Negative for dizziness and headaches.  All other systems reviewed and are negative.  Today's Vitals   01/02/23 1512  BP: 116/76  Pulse: 66  Temp: 98.1 F (36.7 C)  TempSrc: Oral  SpO2: 96%  Weight: 158 lb (71.7 kg)  Height: 4' 11.5" (1.511 m)   Body mass index is 31.38  kg/m.   Physical Exam Vitals reviewed.  Constitutional:      Appearance: Normal appearance.  HENT:     Head: Normocephalic.     Mouth/Throat:     Mouth: Mucous membranes are moist.     Pharynx: Oropharynx is clear.  Eyes:     Extraocular Movements: Extraocular movements intact.     Conjunctiva/sclera: Conjunctivae normal.     Pupils: Pupils are equal, round, and reactive to light.  Cardiovascular:     Rate and Rhythm: Normal rate and regular rhythm.     Pulses: Normal pulses.     Heart sounds: Normal heart sounds.  Pulmonary:     Effort: Pulmonary effort is normal.     Breath sounds: Normal breath sounds.  Musculoskeletal:     Cervical back: No tenderness.  Lymphadenopathy:     Cervical: No cervical adenopathy.  Skin:    General: Skin is warm and dry.     Capillary Refill: Capillary refill takes less than 2 seconds.  Neurological:     General: No focal deficit present.     Mental Status: She is alert and oriented to person, place, and time.  Psychiatric:        Mood and Affect: Mood normal.        Behavior: Behavior normal.         ASSESSMENT & PLAN: Problem List Items Addressed This Visit       Musculoskeletal and Integument   Allergic dermatitis - Primary    History of allergies in the past. Most likely food allergies Recommend over-the-counter antihistamines and topical Benadryl as needed Recommend evaluation by allergist Referral placed today.      Relevant Orders   Ambulatory referral to Allergy   Urinalysis   CBC with Differential/Platelet   Comprehensive metabolic panel   TSH   Vitamin B12   VITAMIN D 25 Hydroxy (Vit-D Deficiency, Fractures)   Patient Instructions  Food Allergy  A food allergy is when your body reacts to a food in a way that is not normal. The reaction can be mild or very bad. A very bad allergic reaction is called an anaphylactic reaction (anaphylaxis). A very bad reaction is an emergency. What are the causes? Common  foods that can cause a reaction are: Milk. Eggs. Peanuts. Seafood. Wheat. Soy. Tree nuts. These include pecans, walnuts, and cashews. What are the signs or symptoms? Signs of a mild reaction Stuffy nose. Tingling in the mouth. An itchy, red rash. Vomiting. Watery poop (diarrhea). Signs of a very bad reaction Itchy, red,  swollen areas of skin (hives). Swelling of your: Face or eyes. Lips. Mouth or tongue. Throat. Trouble with: Breathing. Talking. Swallowing. Noisy breathing, high-pitched whistling sounds when you breathe, most often when you breathe out (wheezing). Pain in your belly. Having any of these feelings: Warmth in your face (flushed). Dizziness, light-headedness, or fainting. Get help right away if you have symptoms of anaphylaxis. Follow these instructions at home: If you are being tested for an allergy: Avoid foods as told by your doctor (elimination diet). Write down what you eat and drink in a notebook (food diary). Each day, write: What you eat and drink and when. What problems you have and when. If you have a very bad allergy:  Wear a bracelet or necklace that says you have an allergy. Carry your allergy kit (anaphylaxis kit) or an allergy shot (epinephrine injection) with you all the time. Use them as told by your doctor. Make sure that you, your family, and your boss know: The signs of a very bad reaction. How to use your allergy kit. How to give an allergy shot. If you use an allergy shot: Replace your allergy shot immediately after you use it. This is important because you may have another allergic reaction. If possible, carry two allergy shots. General instructions Avoid the foods that you are allergic to. Read food labels. Look for ingredients that you are allergic to. When you are at a restaurant, tell your server that you have an allergy. Ask if your meal has an ingredient that you are allergic to. Take over-the-counter and prescription  medicines only as told by your doctor. Do not drive or use machines until your doctor says that it is safe. Tell all people who care for you that you have a food allergy. This includes your doctor and dentist. If you think that you might be allergic to something else, talk with your doctor. Do not eat a food to see if you are allergic to it without talking with your doctor first. Contact a doctor if: You have signs of a reaction that have not gone away after 2 days. You get worse. You have new signs of a reaction. Get help right away if you have signs of a very bad reaction: Itchy, red, swollen areas of skin. Swelling of your: Face or eyes. Lips. Mouth or tongue. Throat. Trouble with: Breathing. Talking. Swallowing. Noisy breathing (wheezing). Having any of these feelings: Warmth in your face (flushed). Dizziness, light-headedness, or fainting. Pain in your belly. These signs may be an emergency. Use your allergy shot or allergy kit as you have been told. Get medical help right away. Call your local emergency services (911 in the [place] Do not wait to see if the signs will go away. Do not drive yourself to the hospital. If you had to use your allergy pen, you must go to the emergency room even if the medicine seems to be working. This is important because another allergic reaction may happen within 3 days. Summary A food allergy is when your body reacts to a food in a way that is not normal. Avoid the foods that you are allergic to. Wear a bracelet or necklace that says you have an allergy. Carry your allergy kit (anaphylaxis kit) or an allergy shot (epinephrine injection) with you all the time. Use them as told by your doctor. This information is not intended to replace advice given to you by your health care provider. Make sure you discuss any questions you have with your  health care provider. Document Revised: 02/01/2021 Document Reviewed: 02/01/2021 Elsevier Patient Education   [place], MD [facility]

## 2023-01-02 NOTE — Patient Instructions (Signed)
Food Allergy  A food allergy is when your body reacts to a food in a way that is not normal. The reaction can be mild or very bad. A very bad allergic reaction is called an anaphylactic reaction (anaphylaxis). A very bad reaction is an emergency. What are the causes? Common foods that can cause a reaction are: Milk. Eggs. Peanuts. Seafood. Wheat. Soy. Tree nuts. These include pecans, walnuts, and cashews. What are the signs or symptoms? Signs of a mild reaction Stuffy nose. Tingling in the mouth. An itchy, red rash. Vomiting. Watery poop (diarrhea). Signs of a very bad reaction Itchy, red, swollen areas of skin (hives). Swelling of your: Face or eyes. Lips. Mouth or tongue. Throat. Trouble with: Breathing. Talking. Swallowing. Noisy breathing, high-pitched whistling sounds when you breathe, most often when you breathe out (wheezing). Pain in your belly. Having any of these feelings: Warmth in your face (flushed). Dizziness, light-headedness, or fainting. Get help right away if you have symptoms of anaphylaxis. Follow these instructions at home: If you are being tested for an allergy: Avoid foods as told by your doctor (elimination diet). Write down what you eat and drink in a notebook (food diary). Each day, write: What you eat and drink and when. What problems you have and when. If you have a very bad allergy:  Wear a bracelet or necklace that says you have an allergy. Carry your allergy kit (anaphylaxis kit) or an allergy shot (epinephrine injection) with you all the time. Use them as told by your doctor. Make sure that you, your family, and your boss know: The signs of a very bad reaction. How to use your allergy kit. How to give an allergy shot. If you use an allergy shot: Replace your allergy shot immediately after you use it. This is important because you may have another allergic reaction. If possible, carry two allergy shots. General instructions Avoid  the foods that you are allergic to. Read food labels. Look for ingredients that you are allergic to. When you are at a restaurant, tell your server that you have an allergy. Ask if your meal has an ingredient that you are allergic to. Take over-the-counter and prescription medicines only as told by your doctor. Do not drive or use machines until your doctor says that it is safe. Tell all people who care for you that you have a food allergy. This includes your doctor and dentist. If you think that you might be allergic to something else, talk with your doctor. Do not eat a food to see if you are allergic to it without talking with your doctor first. Contact a doctor if: You have signs of a reaction that have not gone away after 2 days. You get worse. You have new signs of a reaction. Get help right away if you have signs of a very bad reaction: Itchy, red, swollen areas of skin. Swelling of your: Face or eyes. Lips. Mouth or tongue. Throat. Trouble with: Breathing. Talking. Swallowing. Noisy breathing (wheezing). Having any of these feelings: Warmth in your face (flushed). Dizziness, light-headedness, or fainting. Pain in your belly. These signs may be an emergency. Use your allergy shot or allergy kit as you have been told. Get medical help right away. Call your local emergency services (911 in the [place] Do not wait to see if the signs will go away. Do not drive yourself to the hospital. If you had to use your allergy pen, you must go to the emergency room even if   the medicine seems to be working. This is important because another allergic reaction may happen within 3 days. Summary A food allergy is when your body reacts to a food in a way that is not normal. Avoid the foods that you are allergic to. Wear a bracelet or necklace that says you have an allergy. Carry your allergy kit (anaphylaxis kit) or an allergy shot (epinephrine injection) with you all the time. Use them as told  by your doctor. This information is not intended to replace advice given to you by your health care provider. Make sure you discuss any questions you have with your health care provider. Document Revised: 02/01/2021 Document Reviewed: 02/01/2021 Elsevier Patient Education  2023 Elsevier Inc.  

## 2023-01-02 NOTE — Assessment & Plan Note (Signed)
History of allergies in the past. Most likely food allergies Recommend over-the-counter antihistamines and topical Benadryl as needed Recommend evaluation by allergist Referral placed today.

## 2023-01-03 ENCOUNTER — Encounter: Payer: Self-pay | Admitting: Emergency Medicine

## 2023-01-04 NOTE — Telephone Encounter (Signed)
Patient has questions pertaining to her lab results. Please advise

## 2023-01-05 ENCOUNTER — Other Ambulatory Visit: Payer: Self-pay | Admitting: Emergency Medicine

## 2023-01-05 DIAGNOSIS — R109 Unspecified abdominal pain: Secondary | ICD-10-CM

## 2023-01-05 DIAGNOSIS — Z85528 Personal history of other malignant neoplasm of kidney: Secondary | ICD-10-CM

## 2023-01-05 DIAGNOSIS — R3129 Other microscopic hematuria: Secondary | ICD-10-CM

## 2023-01-05 DIAGNOSIS — Z87442 Personal history of urinary calculi: Secondary | ICD-10-CM

## 2023-01-15 ENCOUNTER — Other Ambulatory Visit: Payer: Self-pay

## 2023-01-15 ENCOUNTER — Ambulatory Visit (INDEPENDENT_AMBULATORY_CARE_PROVIDER_SITE_OTHER): Payer: Self-pay | Admitting: Family Medicine

## 2023-01-15 ENCOUNTER — Encounter: Payer: Self-pay | Admitting: Family Medicine

## 2023-01-15 VITALS — BP 112/60 | HR 78 | Temp 98.2°F | Resp 16 | Ht 60.0 in | Wt 160.5 lb

## 2023-01-15 DIAGNOSIS — J3089 Other allergic rhinitis: Secondary | ICD-10-CM

## 2023-01-15 DIAGNOSIS — J4520 Mild intermittent asthma, uncomplicated: Secondary | ICD-10-CM

## 2023-01-15 DIAGNOSIS — L501 Idiopathic urticaria: Secondary | ICD-10-CM

## 2023-01-15 HISTORY — DX: Idiopathic urticaria: L50.1

## 2023-01-15 MED ORDER — FLUTICASONE PROPIONATE HFA 44 MCG/ACT IN AERO: INHALATION_SPRAY | RESPIRATORY_TRACT | 1 refills | Status: DC

## 2023-01-15 MED ORDER — PROAIR RESPICLICK 108 (90 BASE) MCG/ACT IN AEPB: 2.0000 | INHALATION_SPRAY | RESPIRATORY_TRACT | 1 refills | Status: DC | PRN

## 2023-01-15 NOTE — Progress Notes (Signed)
[place] [id number] Dept: [phone]  FOLLOW UP NOTE  Patient ID: Chelsea Fernandez, female    DOB: May 24, 1991  Age: 32 y.o. MRN: [id number] Date of Office Visit: 01/15/2023  Assessment  Chief Complaint: Follow-up (Started in Feb. Getting "things" on her face and chest. Happened multiple times. Took Benadryl and it would go away. Yesterday had patches on left side of face and neck.)  HPI Chelsea Fernandez is a 32 year old female who presents to the clinic for follow-up visit.  She was last seen in this clinic on 06/10/2020 by Dr. Nelva Bush for evaluation of asthma and allergic rhinitis.    At today's visit, she reports that in the first or second week in February 2024 she began to experience hives occurring mainly on her face, neck, and chest.  She reports these areas were raised, red, and itchy and lasted for about 2 days and would recur about once a week.  She reports that during that time she did change her diet to exclude fried food and most meat and include more grains and nuts.  She reports that she is currently avoiding almond milk and rice and not avoiding any other foods.  She thinks that almond milk and rice may contribute to hives, although, she continues to experience hives even while avoiding rice and almond milk.  She denies cardiopulmonary or gastrointestinal symptoms with these hives.  She denies new personal care products, new hobbies, new medications, or insect stings.  She reports that Benadryl decreases the hives and itching, however, the hives return after a few hours.  She also reports that Benadryl makes her very sleepy.  She has not tried a nonsedating antihistamine at this time.  She does report that she experienced intermittent hives several years ago which resolved with the use of Benadryl only.  Asthma is reported as moderately well-controlled with occasional cough which occurs mostly with illness.  She last used her albuterol inhaler with illness in  January 2024 with relief of symptoms.  She currently denies shortness of breath, cough, or wheeze with activity or rest.  She has not yet previously used a maintenance inhaler, however, reports getting prednisone once or twice a year for respiratory issues.  Allergic rhinitis is reported as moderately well-controlled with symptoms including clear rhinorrhea, occasional sneeze, and copious postnasal drainage.  She is not currently taking an antihistamine, using a nasal steroid spray or nasal saline rinse.  Her last environmental allergy testing was on 06/29/2020 and was positive to dust mite and mold.  Her current medications are listed in the chart.   Drug Allergies:  Allergies  Allergen Reactions   Other     Almond Butter    Physical Exam: BP 112/60   Pulse 78   Temp 98.2 F (36.8 C) (Temporal)   Resp 16   Ht 5' (1.524 m)   Wt 160 lb 8 oz (72.8 kg)   SpO2 97%   BMI 31.35 kg/m    Physical Exam Vitals reviewed.  Constitutional:      Appearance: Normal appearance.  HENT:     Head: Normocephalic and atraumatic.     Right Ear: Tympanic membrane normal.     Left Ear: Tympanic membrane normal.     Nose:     Comments: Bilateral nares at this and pale with clear nasal drainage noted.  Pharynx normal.  Ears normal.  Eyes normal.    Mouth/Throat:     Pharynx: Oropharynx is clear.  Eyes:  Conjunctiva/sclera: Conjunctivae normal.  Cardiovascular:     Rate and Rhythm: Normal rate and regular rhythm.     Heart sounds: Normal heart sounds. No murmur heard. Pulmonary:     Effort: Pulmonary effort is normal.     Breath sounds: Normal breath sounds.     Comments: Lungs clear to auscultation Musculoskeletal:     Cervical back: Normal range of motion and neck supple.  Skin:    General: Skin is warm.     Comments: Scattered raised red areas noted on her left jawline.  She produces pictures from an iPhone indicating red, raised areas across both cheeks and on her chest and shoulders  from about 2 weeks ago.  No open areas or drainage noted.  Neurological:     Mental Status: She is alert and oriented to person, place, and time.  Psychiatric:        Mood and Affect: Mood normal.        Behavior: Behavior normal.        Thought Content: Thought content normal.        Judgment: Judgment normal.     Diagnostics: FVC 2.45 which is 81% of predicted value, FEV1 2.13 which is 82.5% of predicted value.  Spirometry indicates normal ventilatory function.  Assessment and Plan: 1. Mild intermittent asthma without complication   2. Idiopathic urticaria   3. Perennial allergic rhinitis     Meds ordered this encounter  Medications   fluticasone (FLOVENT HFA) 44 MCG/ACT inhaler    Sig: For asthma flare, begin fluticasone 44-2 puffs twice a day for 1 to 2 weeks or until cough and wheeze free.    Dispense:  1 each    Refill:  1    Do not fill.  Please hold.  Patient will call when needed.   Albuterol Sulfate (PROAIR RESPICLICK) [id number] (90 Base) MCG/ACT AEPB    Sig: Inhale 2 puffs into the lungs every 4 (four) hours as needed. For cough or wheeze    Dispense:  1 each    Refill:  1    Patient Instructions  Asthma Continue albuterol 2 puffs once every 4 hours as needed for cough or wheeze You may use albuterol 2 puffs 5 to 15 minutes before activity to decrease cough or wheeze For asthma flare, begin fluticasone 44-2 puffs twice a day for 1 to 2 weeks or until cough and wheeze free  Allergic rhinitis Continue allergen avoidance measures directed toward dust mite and mold as listed below Begin Xyzal 5 mg once a day as needed for runny nose or itch Continue Flonase 2 sprays in each nostril once a day as needed for a stuffy nose Consider saline nasal rinses as needed for nasal symptoms. Use this before any medicated nasal sprays for best result  Hives Use the least amount of medications while remaining hive free Levocetirizine (Xyzal) 5 mg twice a day and famotidine (Pepcid) 20  mg twice a day. If no symptoms for 7-14 days then decrease to. Levocetirizine (Xyzal) 5 mg twice a day and famotidine (Pepcid) 20 mg once a day.  If no symptoms for 7-14 days then decrease to. Levocetirizine (Xyzal) 5 mg twice a day.  If no symptoms for 7-14 days then decrease to. Levocetirizine (Xyzal) 5 mg once a day.  Lab orders have been placed to help [place] evaluate your hives.  We will call you when results become available  Call the clinic if this treatment plan is not working well for you.  Follow up in 2 months or sooner if needed.   Return in about 2 months (around 03/17/2023), or if symptoms worsen or fail to improve.    Thank you for the opportunity to care for this patient.  Please do not hesitate to contact me with questions.  Gareth Morgan, FNP [facility]

## 2023-01-15 NOTE — Patient Instructions (Addendum)
Asthma Continue albuterol 2 puffs once every 4 hours as needed for cough or wheeze You may use albuterol 2 puffs 5 to 15 minutes before activity to decrease cough or wheeze For asthma flare, begin fluticasone 44-2 puffs twice a day for 1 to 2 weeks or until cough and wheeze free. This medication will be on hold at your pharmacy. You will need to call to have this filled if you need this medication for asthma flare  Allergic rhinitis Continue allergen avoidance measures directed toward dust mite and mold as listed below Begin Xyzal 5 mg once a day as needed for runny nose or itch Continue Flonase 2 sprays in each nostril once a day as needed for a stuffy nose Consider saline nasal rinses as needed for nasal symptoms. Use this before any medicated nasal sprays for best result  Hives Use the least amount of medications while remaining hive free Levocetirizine (Xyzal) 5 mg twice a day and famotidine (Pepcid) 20 mg twice a day. If no symptoms for 7-14 days then decrease to. Levocetirizine (Xyzal) 5 mg twice a day and famotidine (Pepcid) 20 mg once a day.  If no symptoms for 7-14 days then decrease to. Levocetirizine (Xyzal) 5 mg twice a day.  If no symptoms for 7-14 days then decrease to. Levocetirizine (Xyzal) 5 mg once a day.  Lab orders have been placed to help [place] evaluate your hives.  We will call you when results become available  Food sensitivity Continue to avoid almond and rice. Lab orders have been placed to help [place] evaluate your food allergies. We will call you when the results become available.   Call the clinic if this treatment plan is not working well for you.  Follow up in 2 months or sooner if needed.  Control of Mold Allergen Mold and fungi can grow on a variety of surfaces provided certain temperature and moisture conditions exist.  Outdoor molds grow on plants, decaying vegetation and soil.  The major outdoor mold, Alternaria and Cladosporium, are found in very high numbers  during hot and dry conditions.  Generally, a late Summer - Fall peak is seen for common outdoor fungal spores.  Rain will temporarily lower outdoor mold spore count, but counts rise rapidly when the rainy period ends.  The most important indoor molds are Aspergillus and Penicillium.  Dark, humid and poorly ventilated basements are ideal sites for mold growth.  The next most common sites of mold growth are the bathroom and the kitchen.  Outdoor [employer] Use air conditioning and keep windows closed Avoid exposure to decaying vegetation. Avoid leaf raking. Avoid grain handling. Consider wearing a face mask if working in moldy areas.  Indoor Mold Control Maintain humidity below 50%. Clean washable surfaces with 5% bleach solution. Remove sources e.g. Contaminated carpets.   Control of Dust Mite Allergen Dust mites play a major role in allergic asthma and rhinitis. They occur in environments with high humidity wherever human skin is found. Dust mites absorb humidity from the atmosphere (ie, they do not drink) and feed on organic matter (including shed human and animal skin). Dust mites are a microscopic type of insect that you cannot see with the naked eye. High levels of dust mites have been detected from mattresses, pillows, carpets, upholstered furniture, bed covers, clothes, soft toys and any woven material. The principal allergen of the dust mite is found in its feces. A gram of dust may contain 1,000 mites and 250,000 fecal particles. Mite antigen is easily  measured in the air during house cleaning activities. Dust mites do not bite and do not cause harm to humans, other than by triggering allergies/asthma.  Ways to decrease your exposure to dust mites in your home:  1. Encase mattresses, box springs and pillows with a mite-impermeable barrier or cover  2. Wash sheets, blankets and drapes weekly in hot water (130 F) with detergent and dry them in a dryer on the hot setting.  3. Have the  room cleaned frequently with a vacuum cleaner and a damp dust-mop. For carpeting or rugs, vacuuming with a vacuum cleaner equipped with a high-efficiency particulate air (HEPA) filter. The dust mite allergic individual should not be in a room which is being cleaned and should wait 1 hour after cleaning before going into the room.  4. Do not sleep on upholstered furniture (eg, couches).  5. If possible removing carpeting, upholstered furniture and drapery from the home is ideal. Horizontal blinds should be eliminated in the rooms where the person spends the most time (bedroom, study, television room). Washable vinyl, roller-type shades are optimal.  6. Remove all non-washable stuffed toys from the bedroom. Wash stuffed toys weekly like sheets and blankets above.  7. Reduce indoor humidity to less than 50%. Inexpensive humidity monitors can be purchased at most hardware stores. Do not use a humidifier as can make the problem worse and are not recommended.

## 2023-01-23 LAB — ALLERGENS(7)
Brazil Nut IgE: <0.1 kU/L
F020-IgE Almond: <0.1 kU/L
F202-IgE Cashew Nut: <0.1 kU/L
Hazelnut (Filbert) IgE: <0.1 kU/L
Peanut IgE: <0.1 kU/L
Pecan Nut IgE: <0.1 kU/L
Walnut IgE: <0.1 kU/L

## 2023-01-23 LAB — ALLERGENS, ZONE 2

## 2023-01-23 LAB — ALPHA-GAL PANEL
Allergen Lamb IgE: <0.1 kU/L
Beef IgE: <0.1 kU/L
IgE (Immunoglobulin E), Serum: 38 IU/mL (ref 6–495)
O215-IgE Alpha-Gal: <0.1 kU/L
Pork IgE: <0.1 kU/L

## 2023-01-23 LAB — THYROID PEROXIDASE ANTIBODY: Thyroperoxidase Ab SerPl-aCnc: <9 IU/mL (ref 0–34)

## 2023-01-23 LAB — CHRONIC URTICARIA: cu index: 8.8 (ref <10)

## 2023-01-23 LAB — THYROGLOBULIN ANTIBODY: Thyroglobulin Antibody: <1 IU/mL (ref 0.0–0.9)

## 2023-01-23 LAB — ALLERGEN, RICE, F9: Allergen Rice IgE: <0.1 kU/L

## 2023-01-23 LAB — TRYPTASE: Tryptase: 6.2 ug/L (ref 2.2–13.2)

## 2023-01-24 ENCOUNTER — Telehealth: Payer: Self-pay | Admitting: Family Medicine

## 2023-01-24 NOTE — Progress Notes (Signed)
Can you please let this patient know that the lab results are back. Her environmental allergy panel was completely negative, tryptase- which looks at mast cell activity was within normal limits. Chronic urticaria panel was within normal limits. Thyroid antibodies were normal. Foods were all negative including peanut, tree nuts, and rice. Alpha gal panel was negative- looks for red meat allergy. She does not need to avoid any foods at this time. Can you please ask her if her hives have been controlled with the medications? Thank you

## 2023-01-24 NOTE — Telephone Encounter (Signed)
LMOM for patient to call the clinic to discuss causes of hives.

## 2023-02-11 IMAGING — DX DG CHEST 2V
2 series · 2 of 2 positions shown · non-contrast
Comparison: Chest x-ray 08/10/2020.

CLINICAL DATA: 30-year-old female with history of renal cell
carcinoma.

EXAM:
CHEST - 2 VIEW

[chest pa]
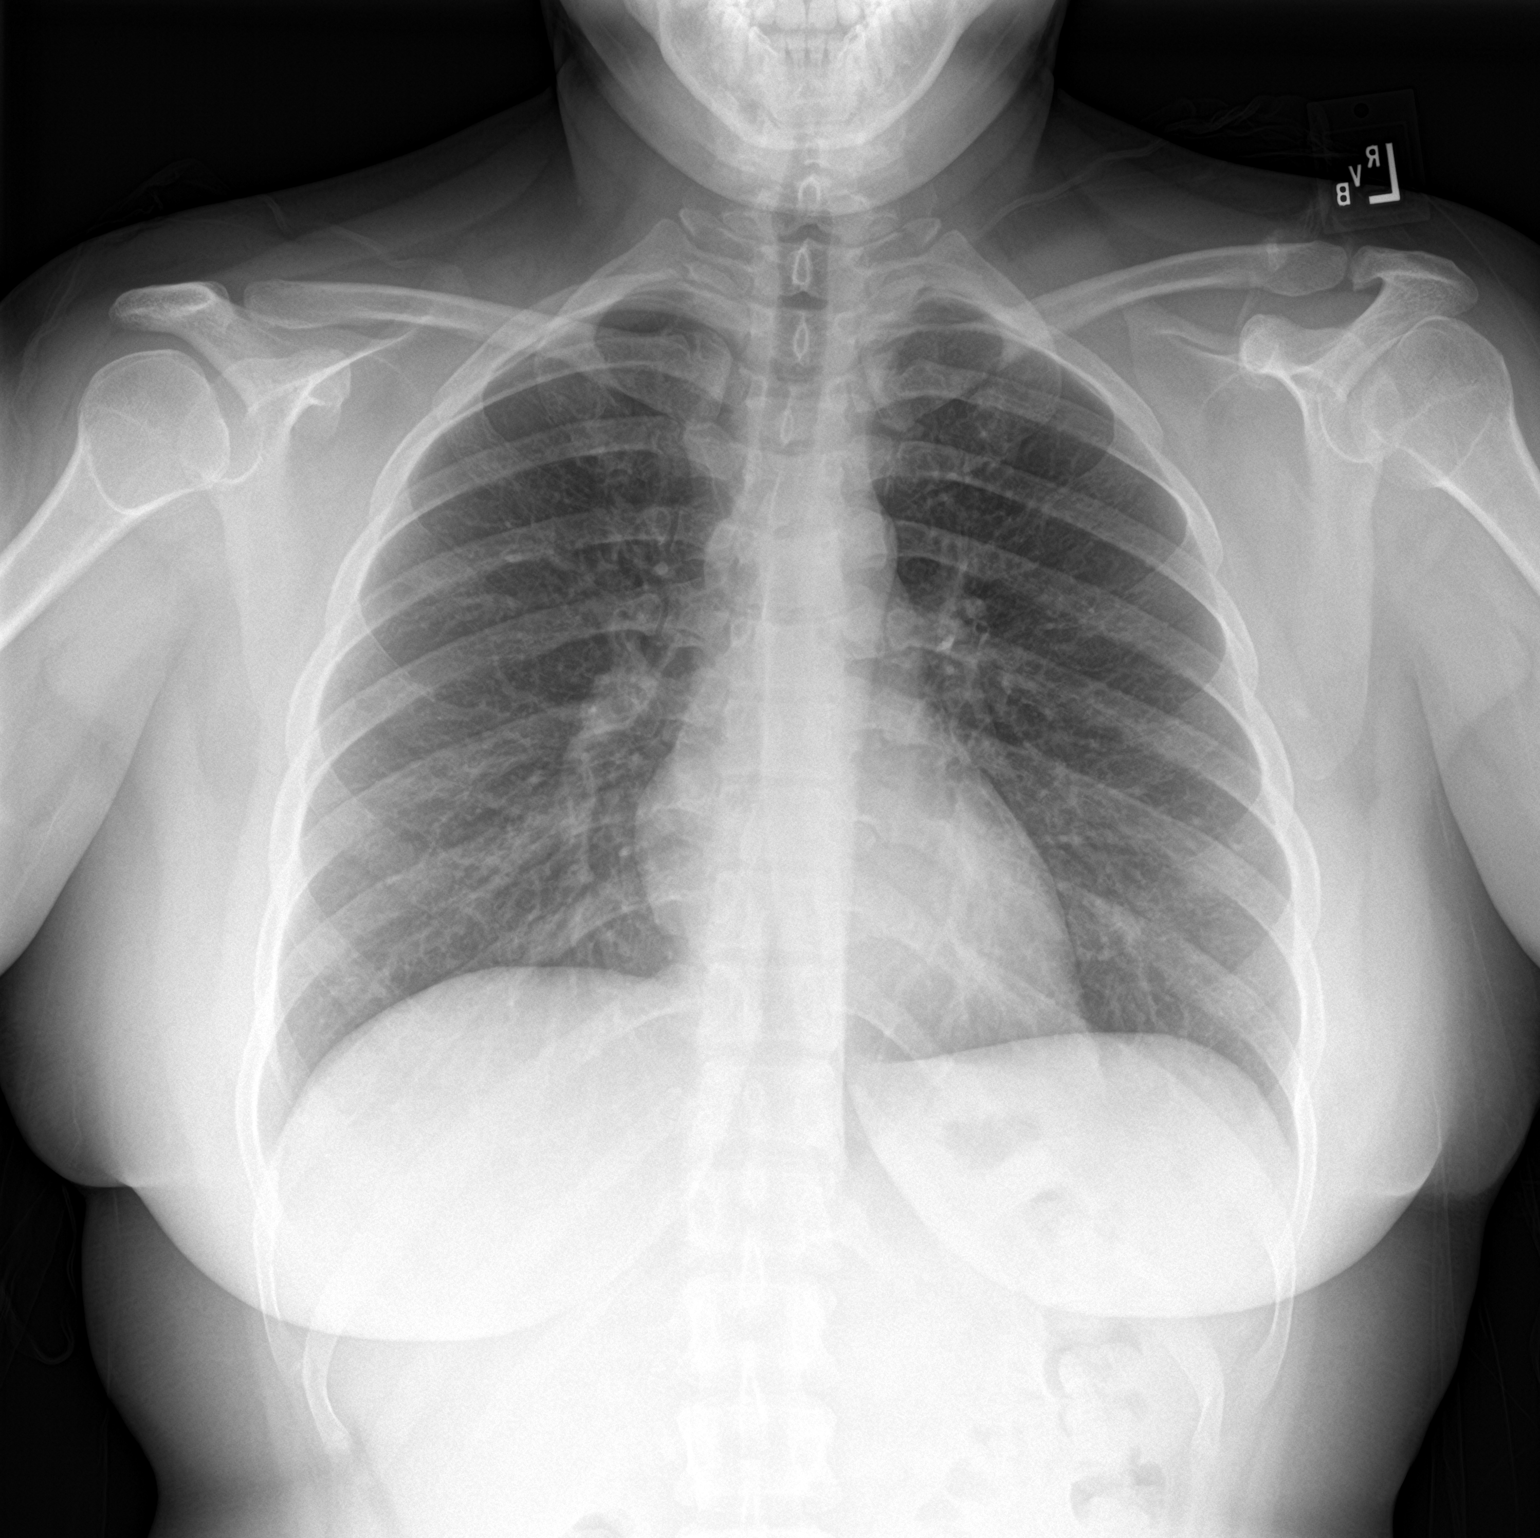

[chest lat]
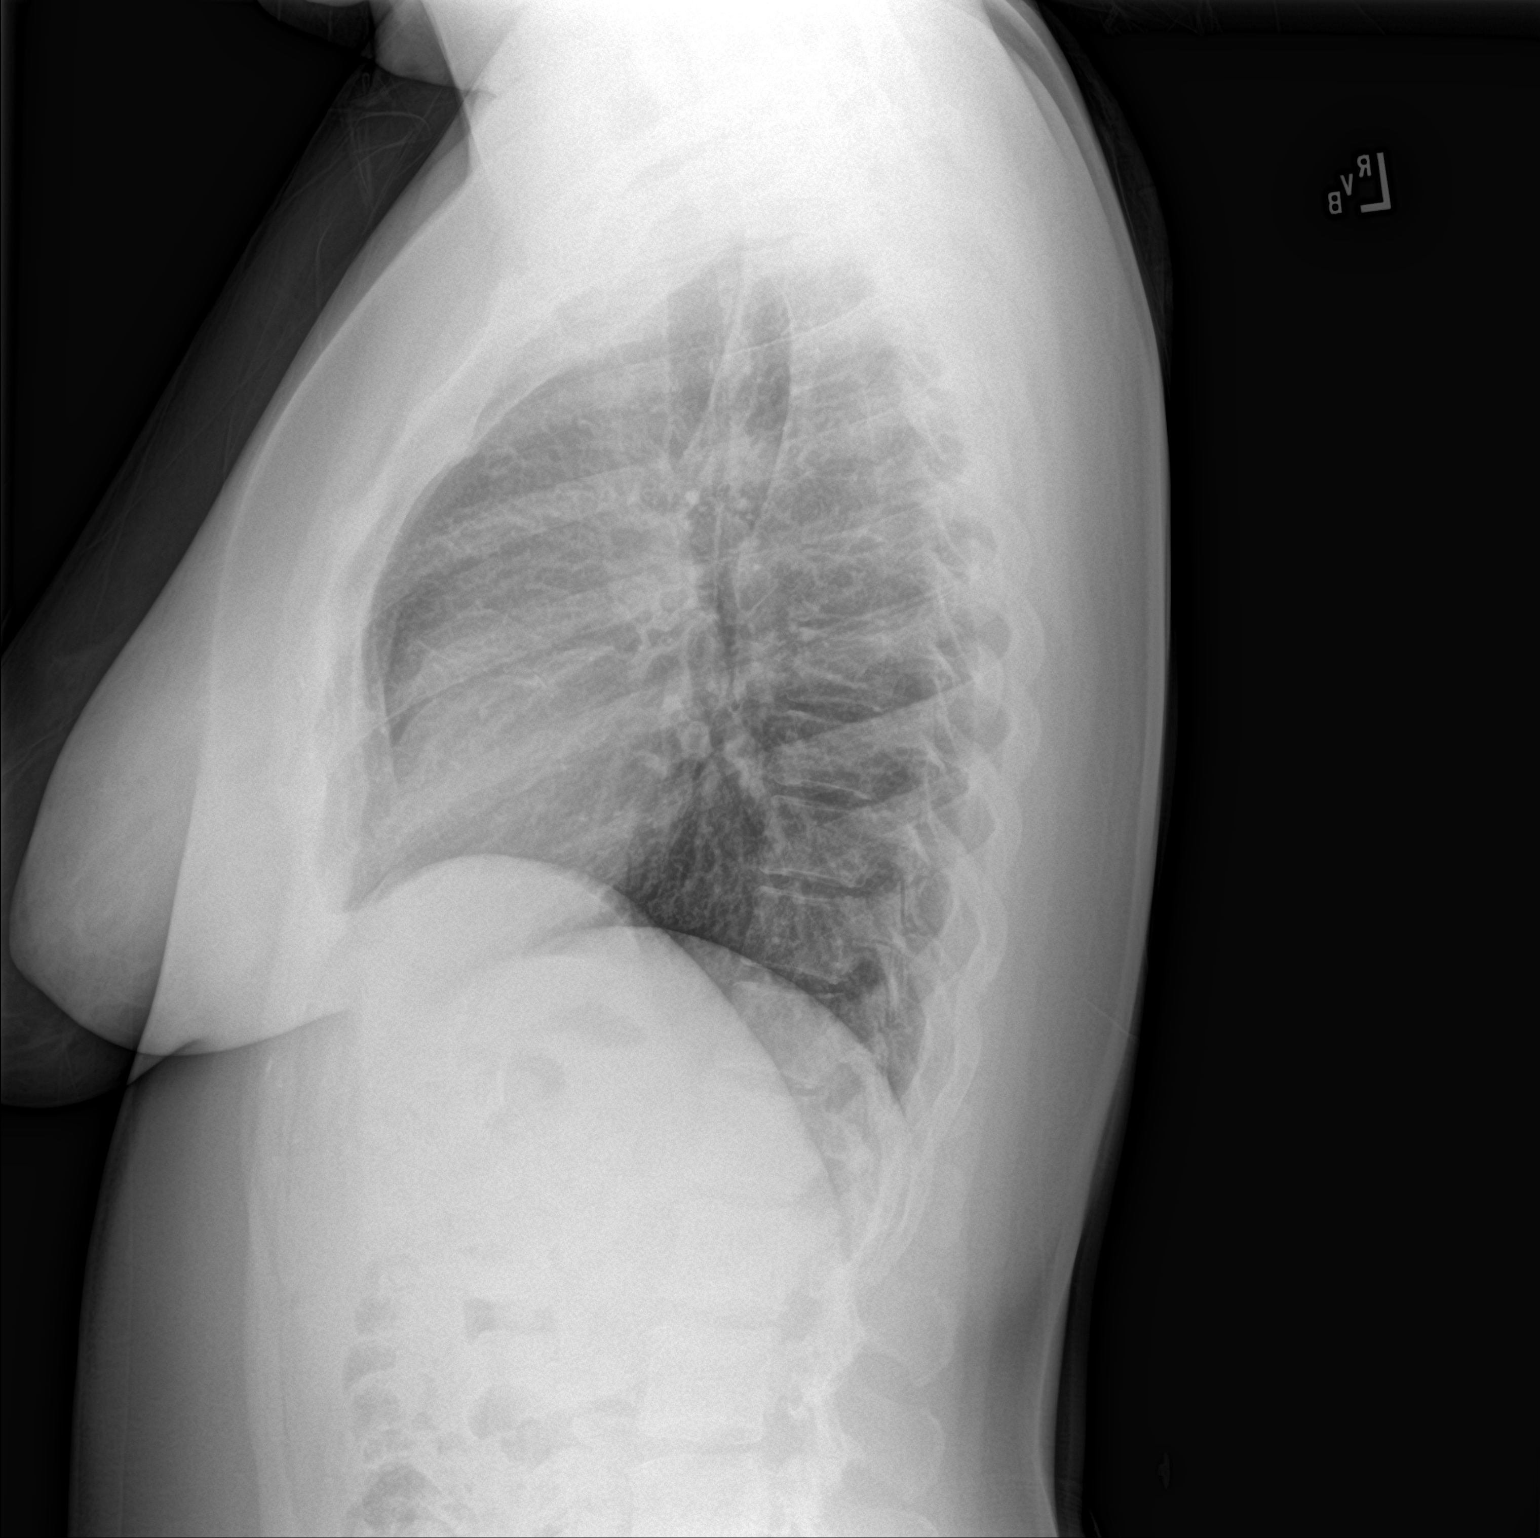

[2 of 2 positions shown; findings below may reference images not displayed]

FINDINGS: Lung volumes are normal. No consolidative airspace disease. No
pleural effusions. No pneumothorax. No pulmonary nodule or mass
noted. Pulmonary vasculature and the cardiomediastinal silhouette
are within normal limits.
IMPRESSION: No radiographic evidence of acute cardiopulmonary disease.

## 2023-02-14 ENCOUNTER — Ambulatory Visit (HOSPITAL_COMMUNITY)
Admission: EM | Admit: 2023-02-14 | Discharge: 2023-02-14 | Disposition: A | Discharge status: Home / Self Care | Payer: No Typology Code available for payment source | Attending: Internal Medicine | Admitting: Internal Medicine

## 2023-02-14 ENCOUNTER — Encounter (HOSPITAL_COMMUNITY): Payer: Self-pay

## 2023-02-14 DIAGNOSIS — Z1152 Encounter for screening for COVID-19: Secondary | ICD-10-CM | POA: Insufficient documentation

## 2023-02-14 DIAGNOSIS — J4520 Mild intermittent asthma, uncomplicated: Secondary | ICD-10-CM

## 2023-02-14 DIAGNOSIS — Z905 Acquired absence of kidney: Secondary | ICD-10-CM | POA: Insufficient documentation

## 2023-02-14 DIAGNOSIS — Z85528 Personal history of other malignant neoplasm of kidney: Secondary | ICD-10-CM | POA: Insufficient documentation

## 2023-02-14 DIAGNOSIS — J101 Influenza due to other identified influenza virus with other respiratory manifestations: Secondary | ICD-10-CM | POA: Insufficient documentation

## 2023-02-14 LAB — POCT INFLUENZA A/B
Influenza A, POC: POSITIVE — AB
Influenza B, POC: NEGATIVE

## 2023-02-14 MED ORDER — PROMETHAZINE-DM 6.25-15 MG/5ML PO SYRP: 5.0000 mL | ORAL_SOLUTION | Freq: Every evening | ORAL | 0 refills | Status: DC | PRN

## 2023-02-14 MED ORDER — BENZONATATE 100 MG PO CAPS: 100.0000 mg | ORAL_CAPSULE | Freq: Three times a day (TID) | ORAL | 0 refills | Status: DC

## 2023-02-14 MED ORDER — ACETAMINOPHEN 325 MG PO TABS
975.0000 mg | ORAL_TABLET | Freq: Once | ORAL | Status: AC
  Administered 2023-02-14: 975 mg via ORAL

## 2023-02-14 MED ORDER — OSELTAMIVIR PHOSPHATE 75 MG PO CAPS: 75.0000 mg | ORAL_CAPSULE | Freq: Two times a day (BID) | ORAL | 0 refills | Status: DC

## 2023-02-14 MED ORDER — ACETAMINOPHEN 325 MG PO TABS
ORAL_TABLET | ORAL | Status: AC
  Filled 2023-02-14: qty 3

## 2023-02-14 MED ORDER — IBUPROFEN 800 MG PO TABS: 800.0000 mg | ORAL_TABLET | Freq: Once | ORAL | Status: DC

## 2023-02-14 NOTE — Discharge Instructions (Signed)
You have a viral upper respiratory infection.  You tested positive for influenza A in the clinic. Take Tamiflu twice daily for the next 5 days.  Use the following medicines to help with symptoms: - Plain Mucinex (guaifenesin) over the counter as directed every 12 hours to thin mucous so that you are able to get it out of your body easier. Drink plenty of water while taking this medication so that it works well in your body (at least 8 cups a day).  - Tylenol 1,000mg  every 6 hours as needed for aches and pains and fever/chills.  Avoid using ibuprofen due to your history of kidney cancer and removal of one of your kidneys. - Tessalon perles every 8 hours as needed for cough. - Take Promethazine DM cough medication to help with your cough at nighttime so that you are able to sleep. Do not drive, drink alcohol, or go to work while taking this medication since it can make you sleepy. Only take this at nighttime.   1 tablespoon of honey in warm water and/or salt water gargles may also help with symptoms. Humidifier to your room will help add water to the air and reduce coughing.  If you develop any new or worsening symptoms, please return.  If your symptoms are severe, please go to the emergency room.  Follow-up with your primary care provider for further evaluation and management of your symptoms as well as ongoing wellness visits.  I hope you feel better!

## 2023-02-14 NOTE — ED Triage Notes (Signed)
Pt presents with c/o cough, fever, runny nose X few days.   Pt states she started wheezing and states she used her inhaler. Pt states she does not think the inhaler worked.

## 2023-02-14 NOTE — ED Provider Notes (Signed)
[facility]    CSN: [id number] Arrival date & time: 02/14/23  1744      History   Chief Complaint Chief Complaint  Patient presents with   Fever   Wheezing    HPI Chelsea Fernandez is a 32 y.o. female.   Patient presents to urgent care for evaluation of cough, nasal congestion, generalized headache, body aches, and generalized fatigue that started abruptly on Monday, February 12, 2023 (2 days ago).  She also reports fever as high as 103 at home that responded well to Tylenol/ibuprofen.  Last dose of ibuprofen was this morning at 11 AM, currently afebrile.  Cough is dry and nonproductive/worse at nighttime.  Headache is generalized.  History of asthma.  Using Flovent inhaler and states this has helped slightly with the cough and the wheezing.  Tolerating food and fluids well without nausea, vomiting, diarrhea, or abdominal pain.  No urinary symptoms or vaginal symptoms.  No recent antibiotic or steroid use.  History of renal cancer and nephrectomy.  No recent known sick contacts with similar symptoms.  Non-smoker, denies drug use.  She has been using over-the-counter medications without much relief of symptoms.   Fever Wheezing Associated symptoms: fever     Past Medical History:  Diagnosis Date   Acid reflux    Anxiety    ASCUS of cervix with negative high risk HPV    Asthma    CIN II (cervical intraepithelial neoplasia II)    Family history of adverse reaction to anesthesia    aunt has high tolerance to anesthesia  will not put her tosleep   Headache    migraine rarely   History of kidney stones    Idiopathic urticaria 01/15/2023   right renal ca dx'd 01/2018   Kidney cancer    Patient Active Problem List   Diagnosis Date Noted   Mild intermittent asthma without complication 01/15/2023   Idiopathic urticaria 01/15/2023   Perennial allergic rhinitis 01/15/2023   Allergic dermatitis 01/02/2023   Chronic heartburn 01/04/2021   Renal carcinoma, right 05/12/2020    Seasonal allergic rhinitis 03/10/2020   Renal mass 04/24/2018   Dysplasia of cervix, 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   ASCUS with positive high risk HPV cervical 10/04/2016   Hyperlipidemia 09/05/2016   Fluctuation of weight 09/04/2016    Past Surgical History:  Procedure Laterality Date   DILATION AND CURETTAGE OF UTERUS  2012   NOSE SURGERY     ROBOTIC ASSITED PARTIAL NEPHRECTOMY Right 04/24/2018   Procedure: ROBOTIC ASSITED NEPHRECTOMY;  Surgeon: Rene Paci, MD;  Location: [facility];  Service: Urology;  Laterality: Right;    OB History     Gravida  1   Para  0   Term  0   Preterm  0   AB  1   Living         SAB  1   IAB  0   Ectopic  0   Multiple      Live Births               Home Medications    Prior to Admission medications   Medication Sig Start Date End Date Taking? Authorizing Provider  benzonatate (TESSALON) 100 MG capsule Take 1 capsule (100 mg total) by mouth every 8 (eight) hours. 02/14/23  Yes Carlisle Beers, FNP  oseltamivir (TAMIFLU) 75 MG capsule Take 1 capsule (75 mg total) by mouth every 12 (twelve) hours. 02/14/23  Yes Carlisle Beers, FNP  promethazine-dextromethorphan (PROMETHAZINE-DM) 6.25-15 MG/5ML syrup Take 5 mLs by mouth at bedtime as needed for cough. 02/14/23  Yes Carlisle Beers, FNP  Albuterol Sulfate (PROAIR RESPICLICK) 108 (90 Base) MCG/ACT AEPB Inhale 2 puffs into the lungs every 4 (four) hours as needed. For cough or wheeze 01/15/23   Ambs, Norvel Richards, FNP  diphenhydrAMINE HCl (BENADRYL ALLERGY PO) Take by mouth as needed.    [provider]  fluticasone (FLOVENT HFA) 44 MCG/ACT inhaler For asthma flare, begin fluticasone 44-2 puffs twice a day for 1 to 2 weeks or until cough and wheeze free. 01/15/23   Ambs, Norvel Richards, FNP    Family History Family History  Adopted: Yes  Problem Relation Age of Onset   Hypertension Mother    Stomach cancer Maternal Grandmother    Cancer Maternal Grandmother          STOMACH    Hypertension Maternal Aunt    Hypertension Maternal Uncle    Colon cancer Neg Hx    Esophageal cancer Neg Hx    Rectal cancer Neg Hx     Social History Social History   Tobacco Use   Smoking status: Never   Smokeless tobacco: Never  Vaping Use   Vaping Use: Never used  Substance Use Topics   Alcohol use: No   Drug use: No     Allergies   Other   Review of Systems Review of Systems  Constitutional:  Positive for fever.  Respiratory:  Positive for wheezing.   Per HPI   Physical Exam Triage Vital Signs ED Triage Vitals  Enc Vitals Group     BP 02/14/23 1818 113/75     Pulse Rate 02/14/23 1817 (!) 102     Resp 02/14/23 1817 16     Temp 02/14/23 1817 98.6 F (37 C)     Temp Source 02/14/23 1817 Oral     SpO2 02/14/23 1817 96 %     Weight --      Height --      Head Circumference --      Peak Flow --      Pain Score 02/14/23 1815 2     Pain Loc --      Pain Edu? --      Excl. in GC? --    No data found.  Updated Vital Signs BP 113/75   Pulse (!) 102   Temp 98.6 F (37 C) (Oral)   Resp 16   LMP 01/19/2023   SpO2 96%   Visual Acuity Right Eye Distance:   Left Eye Distance:   Bilateral Distance:    Right Eye Near:   Left Eye Near:    Bilateral Near:     Physical Exam Vitals and nursing note reviewed.  Constitutional:      Appearance: She is ill-appearing. She is not toxic-appearing.  HENT:     Head: Normocephalic and atraumatic.     Right Ear: Hearing, tympanic membrane, ear canal and external ear normal.     Left Ear: Hearing, tympanic membrane, ear canal and external ear normal.     Nose: Congestion present.     Mouth/Throat:     Lips: Pink.     Mouth: Mucous membranes are moist. No injury.     Tongue: No lesions. Tongue does not deviate from midline.     Palate: No mass and lesions.     Pharynx: Oropharynx is clear. Uvula midline. Posterior oropharyngeal erythema present. No pharyngeal swelling, oropharyngeal exudate  or  uvula swelling.     Tonsils: No tonsillar exudate or tonsillar abscesses.  Eyes:     General: Lids are normal. Vision grossly intact. Gaze aligned appropriately.     Extraocular Movements: Extraocular movements intact.     Conjunctiva/sclera: Conjunctivae normal.  Cardiovascular:     Rate and Rhythm: Normal rate and regular rhythm.     Heart sounds: Normal heart sounds, S1 normal and S2 normal.  Pulmonary:     Effort: Pulmonary effort is normal. No respiratory distress.     Breath sounds: Normal breath sounds and air entry. No stridor. No wheezing, rhonchi or rales.     Comments: Coarse breath sounds throughout.  Speaking in full sentences without difficulty.  No adventitious lung sounds heard auscultation. Chest:     Chest wall: No tenderness.  Musculoskeletal:     Cervical back: Neck supple.     Right lower leg: No edema.     Left lower leg: No edema.  Lymphadenopathy:     Cervical: Cervical adenopathy present.  Skin:    General: Skin is warm and dry.     Capillary Refill: Capillary refill takes less than 2 seconds.     Findings: No rash.  Neurological:     General: No focal deficit present.     Mental Status: She is alert and oriented to person, place, and time. Mental status is at baseline.     Cranial Nerves: No dysarthria or facial asymmetry.  Psychiatric:        Mood and Affect: Mood normal.        Speech: Speech normal.        Behavior: Behavior normal.        Thought Content: Thought content normal.        Judgment: Judgment normal.      UC Treatments / Results  Labs (all labs ordered are listed, but only abnormal results are displayed) Labs Reviewed  POCT INFLUENZA A/B - Abnormal; Notable for the following components:      Result Value   Influenza A, POC Positive (*)    All other components within normal limits  SARS CORONAVIRUS 2 (TAT 6-24 HRS)    EKG   Radiology No results found.  Procedures Procedures (including critical care  time)  Medications Ordered in UC Medications  acetaminophen (TYLENOL) tablet 975 mg (975 mg Oral Given 02/14/23 1854)    Initial Impression / Assessment and Plan / UC Course  I have reviewed the triage vital signs and the nursing notes.  Pertinent labs & imaging results that were available during my care of the patient were reviewed by me and considered in my medical decision making (see chart for details).   1.  Influenza A, mild intermittent asthma without complication Influenza A testing is positive in clinic by point-of-care testing.  Tamiflu sent to pharmacy to reduce length and severity of symptoms to be taken as prescribed.  Will manage this with other medications for symptomatic relief.  Tessalon Perles and Promethazine DM sent to pharmacy to be taken as prescribed, drowsiness precautions discussed.  Recommend use of over-the-counter medications such as Mucinex to help with further nasal congestion and cough relief.  Notification for prednisone yesterday as patient is not short of breath, breath sounds are normal, and vital signs are hemodynamically stable.  No signs of asthma exacerbation.  May continue using inhaler as needed for cough, shortness of breath, and wheeze.  Deferred imaging based on stable cardiopulmonary exam and hemodynamically stable vital signs in  clinic.  She is not a candidate for NSAIDs due to history of renal cancer and nephrectomy, but may use Tylenol as needed for fever, aches and pains, and chills.  She is agreeable with plan.  Reviewed most recent CMP from March 2024 which shows stable kidney function.  Discussed physical exam and available lab work findings in clinic with patient.  Counseled patient regarding appropriate use of medications and potential side effects for all medications recommended or prescribed today. Discussed red flag signs and symptoms of worsening condition,when to call the PCP office, return to urgent care, and when to seek higher level of care  in the emergency department. Patient verbalizes understanding and agreement with plan. All questions answered. Patient discharged in stable condition.    Final Clinical Impressions(s) / UC Diagnoses   Final diagnoses:  Influenza A  Mild intermittent asthma without complication     Discharge Instructions      You have a viral upper respiratory infection.  You tested positive for influenza A in the clinic. Take Tamiflu twice daily for the next 5 days.  Use the following medicines to help with symptoms: - Plain Mucinex (guaifenesin) over the counter as directed every 12 hours to thin mucous so that you are able to get it out of your body easier. Drink plenty of water while taking this medication so that it works well in your body (at least 8 cups a day).  - Tylenol 1,000mg  every 6 hours as needed for aches and pains and fever/chills.  Avoid using ibuprofen due to your history of kidney cancer and removal of one of your kidneys. - Tessalon perles every 8 hours as needed for cough. - Take Promethazine DM cough medication to help with your cough at nighttime so that you are able to sleep. Do not drive, drink alcohol, or go to work while taking this medication since it can make you sleepy. Only take this at nighttime.   1 tablespoon of honey in warm water and/or salt water gargles may also help with symptoms. Humidifier to your room will help add water to the air and reduce coughing.  If you develop any new or worsening symptoms, please return.  If your symptoms are severe, please go to the emergency room.  Follow-up with your primary care provider for further evaluation and management of your symptoms as well as ongoing wellness visits.  I hope you feel better!     ED Prescriptions     Medication Sig Dispense Auth. Provider   oseltamivir (TAMIFLU) 75 MG capsule Take 1 capsule (75 mg total) by mouth every 12 (twelve) hours. 10 capsule Carlisle Beers, FNP   benzonatate (TESSALON)  100 MG capsule Take 1 capsule (100 mg total) by mouth every 8 (eight) hours. 21 capsule Carlisle Beers, FNP   promethazine-dextromethorphan (PROMETHAZINE-DM) 6.25-15 MG/5ML syrup Take 5 mLs by mouth at bedtime as needed for cough. 118 mL Carlisle Beers, FNP      PDMP not reviewed this encounter.   Carlisle Beers, [place] 02/14/23 1930

## 2023-02-15 LAB — SARS CORONAVIRUS 2 (TAT 6-24 HRS): SARS Coronavirus 2: NEGATIVE

## 2023-02-23 ENCOUNTER — Encounter: Payer: Self-pay | Admitting: Obstetrics & Gynecology

## 2023-02-23 ENCOUNTER — Other Ambulatory Visit (HOSPITAL_COMMUNITY)
Admission: RE | Admit: 2023-02-23 | Discharge: 2023-02-23 | Disposition: A | Discharge status: Home / Self Care | Payer: Self-pay | Source: Ambulatory Visit | Attending: Obstetrics & Gynecology | Admitting: Obstetrics & Gynecology

## 2023-02-23 ENCOUNTER — Ambulatory Visit (INDEPENDENT_AMBULATORY_CARE_PROVIDER_SITE_OTHER): Payer: Self-pay | Admitting: Obstetrics & Gynecology

## 2023-02-23 VITALS — BP 110/74 | HR 71

## 2023-02-23 DIAGNOSIS — N871 Moderate cervical dysplasia: Secondary | ICD-10-CM | POA: Insufficient documentation

## 2023-02-23 DIAGNOSIS — R87612 Low grade squamous intraepithelial lesion on cytologic smear of cervix (LGSIL): Secondary | ICD-10-CM

## 2023-02-23 DIAGNOSIS — Z01812 Encounter for preprocedural laboratory examination: Secondary | ICD-10-CM

## 2023-02-23 DIAGNOSIS — G8918 Other acute postprocedural pain: Secondary | ICD-10-CM

## 2023-02-23 LAB — PREGNANCY, URINE: Preg Test, Ur: NEGATIVE

## 2023-02-23 MED ORDER — IBUPROFEN 200 MG PO TABS
800.0000 mg | ORAL_TABLET | Freq: Once | ORAL | Status: DC
Start: 2023-02-23 — End: 2024-06-09

## 2023-02-23 NOTE — Progress Notes (Signed)
    Chelsea Fernandez [birth year] [id number]        32 y.o.  G1P0010   RP: LGSIL for Colpo  HPI: LGSIL/HPV HR Neg on Pap 10/31/22.  Colpo 09/2016 Severe Dysplasia/CIN2. HPV 16 positive in 2018.  Per patient, no treatment done, but received the HPV vaccine.   OB History  Gravida Para Term Preterm AB Living  1 0 0 0 1    SAB IAB Ectopic Multiple Live Births  1 0 0        # Outcome Date GA Lbr Len/2nd Weight Sex Delivery Anes PTL Lv  1 SAB             Past medical history,surgical history, problem list, medications, allergies, family history and social history were all reviewed and documented in the EPIC chart.   Directed ROS with pertinent positives and negatives documented in the history of present illness/assessment and plan.  Exam:  Vitals:   02/23/23 1036  BP: 110/74  Pulse: 71  SpO2: 96%   General appearance:  Normal  Colposcopy Procedure Note Galit Urich 02/23/2023  Indications: LGSIL  Procedure Details  The risks and benefits of the procedure and Written informed consent obtained.  Speculum placed in vagina and excellent visualization of cervix achieved, cervix swabbed x 3 with acetic acid solution.  Findings:  Cervix colposcopy: Physical Exam Genitourinary:        Vaginal colposcopy: Normal  Vulvar colposcopy: Normal  Perirectal colposcopy: Normal  The cervix was sprayed with Hurricane before performing the cervical biopsies.  Specimens: HPV HR, reflex 16-18-45.  Cervical Bxs 5-9-1 O'Clock  Complications: No Cx, good hemostasis with Silver Nitrate . Plan: Management per results   Assessment/Plan:  32 y.o. G1P0010   1. LGSIL on Pap smear of cervix LGSIL/HPV HR Neg on Pap 10/31/22.  Colpo 09/2016 Severe Dysplasia/CIN2. HPV 16 positive in 2018.  Per patient, no treatment done, but received the HPV vaccine. Colposcopy findings reviewed with patient.  Post procedure precautions reviewed.  Management per results. - Colposcopy - Cervicovaginal  ancillary only( [place]) - Surgical pathology( [place]/ [facility])  2. H/O Dysplasia of cervix, high grade CIN 2 in 2017 HPV 16 pos in 2018. - Cervicovaginal ancillary only( [place])  3. Pre-procedure lab exam UPT Neg - Pregnancy, urine  4. Post procedure discomfort - ibuprofen (ADVIL) tablet 800 mg   Genia Del MD, 10:38 AM 02/23/2023

## 2023-02-27 LAB — CERVICOVAGINAL ANCILLARY ONLY
Comment: NEGATIVE
Comment: NEGATIVE
HPV 16: NEGATIVE
HPV 18 / 45: NEGATIVE

## 2023-03-01 LAB — SURGICAL PATHOLOGY

## 2023-08-13 NOTE — Progress Notes (Unsigned)
GYNECOLOGY  VISIT   HPI: 32 y.o.   Single  Caucasian/ Hispanic  female   G1P0010 with Patient's last menstrual period was 08/02/2023.   here for   6 mo f/u for colpo. Wants to discuss severe cramps and longer cycles.    Menstrual cycle lasted 10 days this month.  Usually lasts 5 - 6 days.  Pap 10/31/22 LGSIL, neg HR HPV.  Colposcopy 02/23/23:  LGSIL. Acute and chronic cervicitis noted.  HR HPV testing negative for 16/18/45.    Prior testing neg for GC/CT on pap done 10/31/22.  Had a new sexual partner, but not currently sexually active.  Wants full STD screening.   Would like a family someday.   Hx prior CIN II, pos HPV 16 in 2018.  This was not treated.   Had only 1 Gardasil vaccine.   GYNECOLOGIC HISTORY: Patient's last menstrual period was 08/02/2023. Contraception:  abstinence Menopausal hormone therapy:  n/a Last mammogram:  n/a Last pap smear:   10/31/22 LSIL: HR HPV neg        OB History     Gravida  1   Para  0   Term  0   Preterm  0   AB  1   Living         SAB  1   IAB  0   Ectopic  0   Multiple      Live Births                 Patient Active Problem List   Diagnosis Date Noted   Mild intermittent asthma without complication 01/15/2023   Idiopathic urticaria 01/15/2023   Perennial allergic rhinitis 01/15/2023   Allergic dermatitis 01/02/2023   Chronic heartburn 01/04/2021   Renal carcinoma, right (HCC) 05/12/2020   Seasonal allergic rhinitis 03/10/2020   Renal mass 04/24/2018   Dysplasia of cervix, 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   ASCUS with positive high risk HPV cervical 10/04/2016   Hyperlipidemia 09/05/2016   Fluctuation of weight 09/04/2016    Past Medical History:  Diagnosis Date   Acid reflux    Anxiety    ASCUS of cervix with negative high risk HPV    Asthma    CIN II (cervical intraepithelial neoplasia II)    Family history of adverse reaction to anesthesia    aunt has high tolerance to anesthesia  will not put her  tosleep   Headache    migraine rarely   History of kidney stones    Idiopathic urticaria 01/15/2023   right renal ca dx'd 01/2018   Kidney cancer    Past Surgical History:  Procedure Laterality Date   DILATION AND CURETTAGE OF UTERUS  2012   NOSE SURGERY     ROBOTIC ASSITED PARTIAL NEPHRECTOMY Right 04/24/2018   Procedure: ROBOTIC ASSITED NEPHRECTOMY;  Surgeon: Rene Paci, MD;  Location: [facility];  Service: Urology;  Laterality: Right;    Current Outpatient Medications  Medication Sig Dispense Refill   Albuterol Sulfate (PROAIR RESPICLICK) 108 (90 Base) MCG/ACT AEPB Inhale 2 puffs into the lungs every 4 (four) hours as needed. For cough or wheeze 1 each 1   diphenhydrAMINE HCl (BENADRYL ALLERGY PO) Take by mouth as needed.     fluticasone (FLOVENT HFA) 44 MCG/ACT inhaler For asthma flare, begin fluticasone 44-2 puffs twice a day for 1 to 2 weeks or until cough and wheeze free. 1 each 1   Current Facility-Administered Medications  Medication Dose Route Frequency Provider Last  Rate Last Admin   ibuprofen (ADVIL) tablet 800 mg  800 mg Oral Once Genia Del, MD         ALLERGIES: Other  Family History  Adopted: Yes  Problem Relation Age of Onset   Hypertension Mother    Stomach cancer Maternal Grandmother    Cancer Maternal Grandmother         STOMACH    Hypertension Maternal Aunt    Hypertension Maternal Uncle    Colon cancer Neg Hx    Esophageal cancer Neg Hx    Rectal cancer Neg Hx     Social History   Socioeconomic History   Marital status: Single    Spouse name: Not on file   Number of children: Not on file   Years of education: Not on file   Highest education level: Not on file  Occupational History   Not on file  Tobacco Use   Smoking status: Never   Smokeless tobacco: Never  Vaping Use   Vaping status: Never Used  Substance and Sexual Activity   Alcohol use: No   Drug use: No   Sexual activity: Not Currently    Partners: Male     Birth control/protection: Abstinence    Comment: 1ST INTERCOURSE- 17, Fewer than 5 partners  Other Topics Concern   Not on file  Social History Narrative   ** Merged History Encounter **       Social Determinants of Health   Financial Resource Strain: Not on file  Food Insecurity: Not on file  Transportation Needs: Not on file  Physical Activity: Not on file  Stress: Not on file  Social Connections: Unknown (03/04/2022)   Received from [facility], [facility]   Social Network    Social Network: Not on file  Intimate Partner Violence: Unknown (01/30/2022)   Received from [facility], [facility]   HITS    Physically Hurt: Not on file    Insult or Talk Down To: Not on file    Threaten Physical Harm: Not on file    Scream or Curse: Not on file    Review of Systems  All other systems reviewed and are negative.   PHYSICAL EXAMINATION:    BP 122/84 (BP Location: Left Arm, Patient Position: Sitting, Cuff Size: Normal)   Pulse 60   Ht 5' (1.524 m)   Wt 161 lb (73 kg)   LMP 08/02/2023   SpO2 98%   BMI 31.44 kg/m     General appearance: alert, cooperative and appears stated age   Pelvic: External genitalia:  no lesions              Urethra:  normal appearing urethra with no masses, tenderness or lesions              Bartholins and Skenes: normal                 Vagina: normal appearing vagina with normal color and discharge, no lesions              Cervix: no lesions                Bimanual Exam:  Uterus:  normal size, contour, position, consistency, mobility, non-tender              Adnexa: no mass, fullness, tenderness             Chaperone was present for exam:  Warren Lacy, CMA  ASSESSMENT LGSIL Neg HR HPV.  Acute and chronic cervicitis.  STD screening  Status post Gardasil x 1.  Menstrual change.  Hs renal cancer.  PLAN  We reviewed her pap, HPV status, and colposcopy results.  HPV discussed.  Pap and HR HPV collected with GC/CT/trich testing.  HIV,  RPR, and hep C aby testing.  She will observe her menstrual cycles.  We discussed Gardasil vaccination completion.  She will received Gardasil vaccine #2 today.  Return for annual exam likely in about 6 months.

## 2023-08-27 ENCOUNTER — Ambulatory Visit (INDEPENDENT_AMBULATORY_CARE_PROVIDER_SITE_OTHER): Payer: Self-pay | Admitting: Obstetrics and Gynecology

## 2023-08-27 ENCOUNTER — Other Ambulatory Visit (HOSPITAL_COMMUNITY)
Admission: RE | Admit: 2023-08-27 | Discharge: 2023-08-27 | Disposition: A | Discharge status: Home / Self Care | Payer: Self-pay | Source: Ambulatory Visit | Attending: Obstetrics and Gynecology | Admitting: Obstetrics and Gynecology

## 2023-08-27 ENCOUNTER — Encounter: Payer: Self-pay | Admitting: Obstetrics and Gynecology

## 2023-08-27 VITALS — BP 122/84 | HR 60 | Ht 60.0 in | Wt 161.0 lb

## 2023-08-27 DIAGNOSIS — N870 Mild cervical dysplasia: Secondary | ICD-10-CM | POA: Insufficient documentation

## 2023-08-27 DIAGNOSIS — Z1159 Encounter for screening for other viral diseases: Secondary | ICD-10-CM

## 2023-08-27 DIAGNOSIS — N926 Irregular menstruation, unspecified: Secondary | ICD-10-CM

## 2023-08-27 DIAGNOSIS — Z114 Encounter for screening for human immunodeficiency virus [HIV]: Secondary | ICD-10-CM

## 2023-08-27 DIAGNOSIS — Z113 Encounter for screening for infections with a predominantly sexual mode of transmission: Secondary | ICD-10-CM | POA: Insufficient documentation

## 2023-08-27 DIAGNOSIS — Z23 Encounter for immunization: Secondary | ICD-10-CM

## 2023-08-28 LAB — RPR: RPR Ser Ql: NONREACTIVE

## 2023-08-28 LAB — HEPATITIS C ANTIBODY: Hepatitis C Ab: NONREACTIVE

## 2023-08-28 LAB — HIV ANTIBODY (ROUTINE TESTING W REFLEX): HIV 1&2 Ab, 4th Generation: NONREACTIVE

## 2023-08-29 LAB — CYTOLOGY - PAP
Chlamydia: NEGATIVE
Comment: NEGATIVE
Comment: NEGATIVE
Comment: NEGATIVE
Comment: NORMAL
Diagnosis: UNDETERMINED — AB
High risk HPV: NEGATIVE
Neisseria Gonorrhea: NEGATIVE
Trichomonas: NEGATIVE

## 2023-09-06 ENCOUNTER — Ambulatory Visit (HOSPITAL_COMMUNITY)
Admission: RE | Admit: 2023-09-06 | Discharge: 2023-09-06 | Disposition: A | Discharge status: Home / Self Care | Payer: Self-pay | Source: Ambulatory Visit | Attending: Urology | Admitting: Urology

## 2023-09-06 ENCOUNTER — Other Ambulatory Visit (HOSPITAL_COMMUNITY): Payer: Self-pay | Admitting: Urology

## 2023-09-06 DIAGNOSIS — C641 Malignant neoplasm of right kidney, except renal pelvis: Secondary | ICD-10-CM

## 2023-09-13 ENCOUNTER — Telehealth: Payer: Self-pay

## 2023-09-13 NOTE — Telephone Encounter (Signed)
BS pt LVM in triage line stating that she developed a lump the next day at injection side where 2nd HPV vaccine was administered at her appt on 08/27/2023 and reports that the lump is still there.  Requesting a cb.  Spoke w/ pt and she reports that the lump/area seems to be getting smaller gradually. Denies fever or area being warm/hot to touch.   Denies doing anything at home after receiving injection (taking NSAIDs or using warm/cold compresses to area).   States that area alone can cause some tenderness/discomfort with touch.  Please advise.

## 2023-09-14 ENCOUNTER — Other Ambulatory Visit: Payer: Self-pay | Admitting: Urology

## 2023-09-14 DIAGNOSIS — C641 Malignant neoplasm of right kidney, except renal pelvis: Secondary | ICD-10-CM

## 2023-09-14 NOTE — Telephone Encounter (Signed)
Pt notified and voiced understanding. Routing for final review.

## 2023-09-14 NOTE — Telephone Encounter (Signed)
Per Dr. Kennith Center: "Patient can take ibuprofen as needed. Otherwise, she should continue to monitor. Please inform our office if it is enlarging, becomes more painful, or develops a rash."

## 2023-09-21 ENCOUNTER — Ambulatory Visit (HOSPITAL_COMMUNITY)
Admission: RE | Admit: 2023-09-21 | Discharge: 2023-09-21 | Disposition: A | Discharge status: Home / Self Care | Payer: No Typology Code available for payment source | Source: Ambulatory Visit | Attending: Emergency Medicine

## 2023-09-21 DIAGNOSIS — Z85528 Personal history of other malignant neoplasm of kidney: Secondary | ICD-10-CM | POA: Insufficient documentation

## 2023-09-21 DIAGNOSIS — R109 Unspecified abdominal pain: Secondary | ICD-10-CM | POA: Insufficient documentation

## 2023-09-21 DIAGNOSIS — R3129 Other microscopic hematuria: Secondary | ICD-10-CM | POA: Insufficient documentation

## 2023-09-21 DIAGNOSIS — Z87442 Personal history of urinary calculi: Secondary | ICD-10-CM | POA: Insufficient documentation

## 2023-09-21 DIAGNOSIS — C641 Malignant neoplasm of right kidney, except renal pelvis: Secondary | ICD-10-CM | POA: Insufficient documentation

## 2023-09-21 MED ORDER — IOHEXOL 300 MG/ML  SOLN
100.0000 mL | Freq: Once | INTRAMUSCULAR | Status: AC | PRN
  Administered 2023-09-21: 100 mL via INTRAVENOUS

## 2023-10-04 ENCOUNTER — Encounter: Payer: Self-pay | Admitting: Obstetrics and Gynecology

## 2023-10-30 NOTE — Progress Notes (Signed)
 GYNECOLOGY  VISIT   HPI: 32 y.o.   Single  Hispanic female   G1P0010 with Patient's last menstrual period was 10/18/2023.   here for: vaginal infection--pt reports itching, burning, slight discharge since last appt.  Symptoms are really external.   Irritation extending to her anus.   Some itching prior to her period.   Has not done any self treatments.   Not currently sexually active.  Had neg GC/CT/trich testing on 08/27/23.   No recent abx.   Likes to wear jeans.   No recent change in product use other than toilet tissue.   GYNECOLOGIC HISTORY: Patient's last menstrual period was 10/18/2023. Contraception:  abstinence Menopausal hormone therapy:  n/a Last 2 paps:  08/27/23 ASCUS: HR HPV neg, 10/31/22 LSIL: HR HPV neg History of abnormal Pap or positive HPV:  yes Mammogram:  n/a        OB History     Gravida  1   Para  0   Term  0   Preterm  0   AB  1   Living         SAB  1   IAB  0   Ectopic  0   Multiple      Live Births                 Patient Active Problem List   Diagnosis Date Noted   Mild intermittent asthma without complication 01/15/2023   Idiopathic urticaria 01/15/2023   Perennial allergic rhinitis 01/15/2023   Allergic dermatitis 01/02/2023   Chronic heartburn 01/04/2021   Renal carcinoma, right (HCC) 05/12/2020   Seasonal allergic rhinitis 03/10/2020   Renal mass 04/24/2018   Dysplasia of cervix, 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   ASCUS with positive high risk HPV cervical 10/04/2016   Hyperlipidemia 09/05/2016   Fluctuation of weight 09/04/2016    Past Medical History:  Diagnosis Date   Acid reflux    Anxiety    ASCUS of cervix with negative high risk HPV    Asthma    CIN II (cervical intraepithelial neoplasia II)    Family history of adverse reaction to anesthesia    aunt has high tolerance to anesthesia  will not put her tosleep   Headache    migraine rarely   History of kidney stones    Idiopathic urticaria  01/15/2023   right renal ca dx'd 01/2018   Kidney cancer    Past Surgical History:  Procedure Laterality Date   DILATION AND CURETTAGE OF UTERUS  2012   NOSE SURGERY     ROBOTIC ASSITED PARTIAL NEPHRECTOMY Right 04/24/2018   Procedure: ROBOTIC ASSITED NEPHRECTOMY;  Surgeon: Devere Lonni Righter, MD;  Location: [facility];  Service: Urology;  Laterality: Right;    Current Outpatient Medications  Medication Sig Dispense Refill   Albuterol  Sulfate (PROAIR  RESPICLICK) 108 (90 Base) MCG/ACT AEPB Inhale 2 puffs into the lungs every 4 (four) hours as needed. For cough or wheeze 1 each 1   diphenhydrAMINE  HCl (BENADRYL  ALLERGY PO) Take by mouth as needed.     fluticasone  (FLOVENT  HFA) 44 MCG/ACT inhaler For asthma flare, begin fluticasone  44-2 puffs twice a day for 1 to 2 weeks or until cough and wheeze free. 1 each 1   Current Facility-Administered Medications  Medication Dose Route Frequency Provider Last Rate Last Admin   ibuprofen  (ADVIL ) tablet 800 mg  800 mg Oral Once Lavoie, Marie-Lyne, MD         ALLERGIES: Other  Family History  Adopted: Yes  Problem Relation Age of Onset   Hypertension Mother    Stomach cancer Maternal Grandmother    Cancer Maternal Grandmother         STOMACH    Hypertension Maternal Aunt    Hypertension Maternal Uncle    Colon cancer Neg Hx    Esophageal cancer Neg Hx    Rectal cancer Neg Hx     Social History   Socioeconomic History   Marital status: Single    Spouse name: Not on file   Number of children: Not on file   Years of education: Not on file   Highest education level: Not on file  Occupational History   Not on file  Tobacco Use   Smoking status: Never   Smokeless tobacco: Never  Vaping Use   Vaping status: Never Used  Substance and Sexual Activity   Alcohol use: No   Drug use: No   Sexual activity: Not Currently    Partners: Male    Birth control/protection: Abstinence    Comment: 1ST INTERCOURSE- 17, Fewer than 5 partners   Other Topics Concern   Not on file  Social History Narrative   ** Merged History Encounter **       Social Drivers of Corporate Investment Banker Strain: Not on file  Food Insecurity: Not on file  Transportation Needs: Not on file  Physical Activity: Not on file  Stress: Not on file  Social Connections: Unknown (03/04/2022)   Received from [facility], [facility]   Social Network    Social Network: Not on file  Intimate Partner Violence: Unknown (01/30/2022)   Received from [facility], [facility]   HITS    Physically Hurt: Not on file    Insult or Talk Down To: Not on file    Threaten Physical Harm: Not on file    Scream or Curse: Not on file    Review of Systems  All other systems reviewed and are negative.   PHYSICAL EXAMINATION:   BP 116/70 (BP Location: Right Arm, Patient Position: Sitting, Cuff Size: Small)   Ht 5' (1.524 m)   Wt 165 lb (74.8 kg)   LMP 10/18/2023   BMI 32.22 kg/m     General appearance: alert, cooperative and appears stated age  Pelvic: External genitalia:  no lesions              Urethra:  normal appearing urethra with no masses, tenderness or lesions              Bartholins and Skenes: normal                 Vagina: normal appearing vagina with normal color and small amount of white discharge, no lesions              Cervix: no lesions                Bimanual Exam:  Uterus:  normal size, contour, position, consistency, mobility, non-tender              Adnexa: no mass, fullness, tenderness      Chaperone was present for exam:  Damien FALCON, CMA  ASSESSMENT:  Vulvovaginitis.   PLAN:  Wet prep:  negative yeast, negative clue cells, negative trichomonas. Rx for Mycolog II to vulva bid x 1 week prn.  We discussed using looser fitting clothing and avoiding irritants which may contribute to vulvar irritation.  FU for annual  exam and prn.    22 min  total time was spent for this patient encounter, including preparation, face-to-face  counseling with the patient, coordination of care, and documentation of the encounter.

## 2023-11-01 ENCOUNTER — Encounter: Payer: Self-pay | Admitting: Obstetrics and Gynecology

## 2023-11-01 ENCOUNTER — Ambulatory Visit (INDEPENDENT_AMBULATORY_CARE_PROVIDER_SITE_OTHER): Payer: Self-pay | Admitting: Obstetrics and Gynecology

## 2023-11-01 VITALS — BP 116/70 | Ht 60.0 in | Wt 165.0 lb

## 2023-11-01 DIAGNOSIS — N760 Acute vaginitis: Secondary | ICD-10-CM

## 2023-11-01 LAB — WET PREP FOR TRICH, YEAST, CLUE

## 2023-11-01 MED ORDER — NYSTATIN-TRIAMCINOLONE 100000-0.1 UNIT/GM-% EX OINT: 1.0000 | TOPICAL_OINTMENT | Freq: Two times a day (BID) | CUTANEOUS | 1 refills | Status: AC

## 2023-12-19 ENCOUNTER — Encounter: Payer: Self-pay | Admitting: Obstetrics and Gynecology

## 2024-02-04 ENCOUNTER — Ambulatory Visit (INDEPENDENT_AMBULATORY_CARE_PROVIDER_SITE_OTHER): Payer: Self-pay | Admitting: Emergency Medicine

## 2024-02-04 ENCOUNTER — Encounter: Payer: Self-pay | Admitting: Emergency Medicine

## 2024-02-04 VITALS — BP 120/80 | HR 76 | Temp 98.2°F | Ht 60.0 in | Wt 156.0 lb

## 2024-02-04 DIAGNOSIS — Z13220 Encounter for screening for lipoid disorders: Secondary | ICD-10-CM

## 2024-02-04 DIAGNOSIS — Z0000 Encounter for general adult medical examination without abnormal findings: Secondary | ICD-10-CM

## 2024-02-04 DIAGNOSIS — Z1329 Encounter for screening for other suspected endocrine disorder: Secondary | ICD-10-CM

## 2024-02-04 DIAGNOSIS — Z130 Encounter for screening for diseases of the blood and blood-forming organs and certain disorders involving the immune mechanism: Secondary | ICD-10-CM

## 2024-02-04 DIAGNOSIS — Z13228 Encounter for screening for other metabolic disorders: Secondary | ICD-10-CM

## 2024-02-04 LAB — URINALYSIS, ROUTINE W REFLEX MICROSCOPIC
Bilirubin Urine: NEGATIVE
Ketones, ur: NEGATIVE
Leukocytes,Ua: NEGATIVE
Nitrite: NEGATIVE
Specific Gravity, Urine: 1.025 (ref 1.000–1.030)
Total Protein, Urine: NEGATIVE
Urine Glucose: NEGATIVE
Urobilinogen, UA: 0.2 (ref 0.0–1.0)
pH: 6 (ref 5.0–8.0)

## 2024-02-04 LAB — COMPREHENSIVE METABOLIC PANEL WITH GFR
ALT: 13 U/L (ref 0–35)
AST: 14 U/L (ref 0–37)
Albumin: 4.4 g/dL (ref 3.5–5.2)
Alkaline Phosphatase: 77 U/L (ref 39–117)
BUN: 7 mg/dL (ref 6–23)
CO2: 26 meq/L (ref 19–32)
Calcium: 9.1 mg/dL (ref 8.4–10.5)
Chloride: 107 meq/L (ref 96–112)
Creatinine, Ser: 0.66 mg/dL (ref 0.40–1.20)
GFR: 114.97 mL/min (ref >60.00)
Glucose, Bld: 90 mg/dL (ref 70–99)
Potassium: 4.2 meq/L (ref 3.5–5.1)
Sodium: 141 meq/L (ref 135–145)
Total Bilirubin: 0.4 mg/dL (ref 0.2–1.2)
Total Protein: 7.6 g/dL (ref 6.0–8.3)

## 2024-02-04 LAB — CBC WITH DIFFERENTIAL/PLATELET
Basophils Absolute: 0 10*3/uL (ref 0.0–0.1)
Basophils Relative: 0.6 % (ref 0.0–3.0)
Eosinophils Absolute: 0.1 10*3/uL (ref 0.0–0.7)
Eosinophils Relative: 1.6 % (ref 0.0–5.0)
HCT: 42.9 % (ref 36.0–46.0)
Hemoglobin: 14.2 g/dL (ref 12.0–15.0)
Lymphocytes Relative: 23.5 % (ref 12.0–46.0)
Lymphs Abs: 1.7 10*3/uL (ref 0.7–4.0)
MCHC: 33.1 g/dL (ref 30.0–36.0)
MCV: 83.8 fl (ref 78.0–100.0)
Monocytes Absolute: 0.4 10*3/uL (ref 0.1–1.0)
Monocytes Relative: 5.1 % (ref 3.0–12.0)
Neutro Abs: 5 10*3/uL (ref 1.4–7.7)
Neutrophils Relative %: 69.2 % (ref 43.0–77.0)
Platelets: 252 10*3/uL (ref 150.0–400.0)
RBC: 5.11 Mil/uL (ref 3.87–5.11)
RDW: 13.5 % (ref 11.5–15.5)
WBC: 7.2 10*3/uL (ref 4.0–10.5)

## 2024-02-04 LAB — TSH: TSH: 1.01 u[IU]/mL (ref 0.35–5.50)

## 2024-02-04 LAB — LIPID PANEL
Cholesterol: 237 mg/dL — ABNORMAL HIGH (ref 0–200)
HDL: 50.5 mg/dL (ref >39.00)
LDL Cholesterol: 150 mg/dL — ABNORMAL HIGH (ref 0–99)
NonHDL: 186.13
Total CHOL/HDL Ratio: 5
Triglycerides: 182 mg/dL — ABNORMAL HIGH (ref 0.0–149.0)
VLDL: 36.4 mg/dL (ref 0.0–40.0)

## 2024-02-04 LAB — VITAMIN B12: Vitamin B-12: 281 pg/mL (ref 211–911)

## 2024-02-04 LAB — VITAMIN D 25 HYDROXY (VIT D DEFICIENCY, FRACTURES): VITD: 18.8 ng/mL — ABNORMAL LOW (ref 30.00–100.00)

## 2024-02-04 LAB — HEMOGLOBIN A1C: Hgb A1c MFr Bld: 5.1 % (ref 4.6–6.5)

## 2024-02-04 NOTE — Progress Notes (Signed)
 Chelsea Fernandez 33 y.o.   Chief Complaint  Patient presents with   Spasms    Patient c/o muscle spasms often seems to after getting her kidney removed.5 year remission blood work is due. She mentions having an itch and burn in her vaginal area that started since November, she she did reach out to her OBGYN and only gave her cream which did not help. They  will not be able to see her till August.      HISTORY OF PRESENT ILLNESS: This is a 33 y.o. female here for annual exam. Has had chronic vaginal itching for the last 6 months.  Was able to follow-up with her gynecologist but no answers yet.  Topical antifungal preparations have not helped. No other complaints or medical concerns today.  HPI   Prior to Admission medications   Medication Sig Start Date End Date Taking? Authorizing Provider  Albuterol Sulfate (PROAIR RESPICLICK) 108 (90 Base) MCG/ACT AEPB Inhale 2 puffs into the lungs every 4 (four) hours as needed. For cough or wheeze 01/15/23  Yes Ambs, Norvel Richards, FNP  diphenhydrAMINE HCl (BENADRYL ALLERGY PO) Take by mouth as needed. Patient not taking: Reported on 02/04/2024    [provider]  fluticasone (FLOVENT HFA) 44 MCG/ACT inhaler For asthma flare, begin fluticasone 44-2 puffs twice a day for 1 to 2 weeks or until cough and wheeze free. Patient not taking: Reported on 02/04/2024 01/15/23   Hetty Blend, FNP  nystatin-triamcinolone ointment [facility]) Apply 1 Application topically 2 (two) times daily. Use twice daily for one week as needed/ Patient not taking: Reported on 02/04/2024 11/01/23   Patton Salles, MD    Allergies  Allergen Reactions   Other     [place] Blood    Patient Active Problem List   Diagnosis Date Noted   Mild intermittent asthma without complication 01/15/2023   Idiopathic urticaria 01/15/2023   Perennial allergic rhinitis 01/15/2023   Allergic dermatitis 01/02/2023   Chronic heartburn 01/04/2021   Renal carcinoma, right (HCC) 05/12/2020    Seasonal allergic rhinitis 03/10/2020   Renal mass 04/24/2018   Dysplasia of cervix, 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   ASCUS with positive high risk HPV cervical 10/04/2016   Hyperlipidemia 09/05/2016   Fluctuation of weight 09/04/2016    Past Medical History:  Diagnosis Date   Acid reflux    Anxiety    ASCUS of cervix with negative high risk HPV    Asthma    CIN II (cervical intraepithelial neoplasia II)    Family history of adverse reaction to anesthesia    aunt has high tolerance to anesthesia  will not put her tosleep   Headache    migraine rarely   History of kidney stones    Idiopathic urticaria 01/15/2023   right renal ca dx'd 01/2018   Kidney cancer    Past Surgical History:  Procedure Laterality Date   DILATION AND CURETTAGE OF UTERUS  2012   NOSE SURGERY     ROBOTIC ASSITED PARTIAL NEPHRECTOMY Right 04/24/2018   Procedure: ROBOTIC ASSITED NEPHRECTOMY;  Surgeon: Rene Paci, MD;  Location: [facility];  Service: Urology;  Laterality: Right;    Social History   Socioeconomic History   Marital status: Single    Spouse name: Not on file   Number of children: Not on file   Years of education: Not on file   Highest education level: Not on file  Occupational History   Not on file  Tobacco Use  Smoking status: Never   Smokeless tobacco: Never  Vaping Use   Vaping status: Never Used  Substance and Sexual Activity   Alcohol use: No   Drug use: No   Sexual activity: Not Currently    Partners: Male    Birth control/protection: Abstinence    Comment: 1ST INTERCOURSE- 17, Fewer than 5 partners  Other Topics Concern   Not on file  Social History Narrative   ** Merged History Encounter **       Social Drivers of Corporate investment banker Strain: Not on file  Food Insecurity: Not on file  Transportation Needs: Not on file  Physical Activity: Not on file  Stress: Not on file  Social Connections: Unknown (03/04/2022)   Received from [facility],  [facility]   Social Network    Social Network: Not on file  Intimate Partner Violence: Unknown (01/30/2022)   Received from [facility], [facility]   HITS    Physically Hurt: Not on file    Insult or Talk Down To: Not on file    Threaten Physical Harm: Not on file    Scream or Curse: Not on file    Family History  Adopted: Yes  Problem Relation Age of Onset   Hypertension Mother    Stomach cancer Maternal Grandmother    Cancer Maternal Grandmother         STOMACH    Hypertension Maternal Aunt    Hypertension Maternal Uncle    Colon cancer Neg Hx    Esophageal cancer Neg Hx    Rectal cancer Neg Hx      Review of Systems  Constitutional: Negative.  Negative for chills and fever.  HENT: Negative.  Negative for congestion and sore throat.   Respiratory: Negative.  Negative for cough and shortness of breath.   Cardiovascular: Negative.  Negative for chest pain and palpitations.  Gastrointestinal:  Negative for abdominal pain, diarrhea, nausea and vomiting.  Genitourinary: Negative.  Negative for dysuria and hematuria.  Skin: Negative.  Negative for rash.  Neurological: Negative.  Negative for dizziness and headaches.  All other systems reviewed and are negative.   Vitals:   02/04/24 1040  BP: 120/80  Pulse: 76  Temp: 98.2 F (36.8 C)  SpO2: 97%    Physical Exam Vitals reviewed.  Constitutional:      Appearance: Normal appearance.  HENT:     Head: Normocephalic.     Mouth/Throat:     Mouth: Mucous membranes are moist.     Pharynx: Oropharynx is clear.  Eyes:     Extraocular Movements: Extraocular movements intact.     Conjunctiva/sclera: Conjunctivae normal.     Pupils: Pupils are equal, round, and reactive to light.  Cardiovascular:     Rate and Rhythm: Normal rate and regular rhythm.     Pulses: Normal pulses.     Heart sounds: Normal heart sounds.  Pulmonary:     Effort: Pulmonary effort is normal.     Breath sounds: Normal breath sounds.   Abdominal:     Palpations: Abdomen is soft.     Tenderness: There is no abdominal tenderness.  Musculoskeletal:     Cervical back: No tenderness.  Lymphadenopathy:     Cervical: No cervical adenopathy.  Skin:    General: Skin is warm and dry.  Neurological:     Mental Status: She is alert and oriented to person, place, and time.  Psychiatric:        Mood and Affect:  Mood normal.        Behavior: Behavior normal.      ASSESSMENT & PLAN: Problem List Items Addressed This Visit   None Visit Diagnoses       Routine general medical examination at a health care facility    -  Primary   Relevant Orders   Urinalysis   CBC with Differential/Platelet   Comprehensive metabolic panel with GFR   Hemoglobin A1c   Lipid panel   TSH   Vitamin B12   VITAMIN D 25 Hydroxy (Vit-D Deficiency, Fractures)     Screening for deficiency anemia       Relevant Orders   CBC with Differential/Platelet     Screening for lipoid disorders       Relevant Orders   Lipid panel     Screening for endocrine, metabolic and immunity disorder       Relevant Orders   Urinalysis   Comprehensive metabolic panel with GFR   Hemoglobin A1c   TSH   Vitamin B12   VITAMIN D 25 Hydroxy (Vit-D Deficiency, Fractures)      Modifiable risk factors discussed with patient. Anticipatory guidance according to age provided. The following topics were also discussed: Social Determinants of Health Smoking.  Non-smoker Diet and nutrition Benefits of exercise Cancer family history reviewed Vaccinations review and recommendations Cardiovascular risk assessment and need for blood work Mental health including depression and anxiety Fall and accident prevention  Patient Instructions  Mantenimiento de Radiographer, therapeutic en las mujeres Health Maintenance, Female Adoptar un estilo de vida saludable y recibir atencin preventiva son importantes para promover la salud y Counsellor. Consulte al mdico sobre: El esquema adecuado  para hacerse pruebas y exmenes peridicos. Cosas que puede hacer por su cuenta para prevenir enfermedades y [place] sano. Qu debo saber sobre la dieta, el peso y el ejercicio? Consuma una dieta saludable  Consuma una dieta que incluya muchas verduras, frutas, productos lcteos con bajo contenido de [place] y Associate Professor. No consuma muchos alimentos ricos en grasas slidas, azcares agregados o sodio. Mantenga un peso saludable El ndice de masa muscular [facility]) se [place] para identificar problemas de [place]. Proporciona una estimacin de la grasa corporal basndose en el peso y la altura. Su mdico puede ayudarle a Engineer, site IMC y a Personnel officer o Pharmacologist un peso saludable. Haga ejercicio con regularidad Haga ejercicio con regularidad. Esta es una de las prcticas ms importantes que puede hacer por su salud. La [employer] de los adultos deben seguir estas pautas: Education officer, environmental, al menos, 150 minutos de actividad fsica por semana. El ejercicio debe aumentar la frecuencia cardaca y Media planner transpirar (ejercicio de intensidad moderada). Hacer ejercicios de fortalecimiento por lo [facility] por semana. Agregue esto a su plan de ejercicio de intensidad moderada. Pase menos tiempo sentada. Incluso la actividad fsica ligera puede ser beneficiosa. Controle sus niveles de colesterol y lpidos en la sangre Comience a realizarse anlisis de lpidos y Oncologist en la sangre a los 20 aos y luego reptalos cada 5 aos. Hgase controlar los niveles de colesterol con mayor frecuencia si: Sus niveles de lpidos y colesterol son altos. Es mayor de 40 aos. Presenta un alto riesgo de padecer enfermedades cardacas. Qu debo saber sobre las pruebas de deteccin del cncer? Segn su historia clnica y sus antecedentes familiares, es posible que deba realizarse pruebas de deteccin del cncer en diferentes edades. Esto puede incluir pruebas de deteccin de lo siguiente: Cncer de mama. Cncer de cuello  uterino. Cncer colorrectal.  Cncer de piel. Cncer de pulmn. Qu debo saber sobre la enfermedad cardaca, la diabetes y la hipertensin arterial? Presin arterial y enfermedad cardaca La hipertensin arterial causa enfermedades cardacas y [place] el riesgo de accidente cerebrovascular. Es ms probable que esto se manifieste en las personas que tienen lecturas de presin arterial alta o tienen sobrepeso. Hgase controlar la presin arterial: Cada 3 a 5 aos si tiene entre 18 y 18 aos. Todos los aos si es mayor de 40 aos. Diabetes Realcese exmenes de deteccin de la diabetes con regularidad. Este anlisis revisa el nivel de azcar en la sangre en [facility] [facility]. Hgase las pruebas de deteccin: Cada tres aos despus de los 40 aos de edad si tiene un peso normal y un bajo riesgo de padecer diabetes. Con ms frecuencia y a partir de [place] edad inferior si tiene sobrepeso o un alto riesgo de padecer diabetes. Qu debo saber sobre la prevencin de infecciones? Hepatitis B Si tiene un riesgo ms alto de contraer hepatitis B, debe someterse a un examen de deteccin de este virus. Hable con el mdico para averiguar si tiene riesgo de contraer la infeccin por hepatitis B. Hepatitis C Se recomienda el anlisis a: [employer] 1945 y 1965. Todas las personas que tengan un riesgo de haber contrado hepatitis C. Enfermedades de transmisin sexual (ETS) Hgase las pruebas de Airline pilot de ITS, incluidas la gonorrea y la clamidia, si: Es sexualmente activa y es menor de [address]. Es mayor de [address], y Public affairs consultant informa que corre riesgo de tener este tipo de infecciones. La actividad sexual ha cambiado desde que le hicieron la ltima prueba de deteccin y tiene un riesgo mayor de Warehouse manager clamidia o Copy. Pregntele al mdico si usted tiene riesgo. Pregntele al mdico si usted tiene un alto riesgo de Primary school teacher VIH. El mdico tambin puede recomendarle un medicamento recetado para  ayudar a evitar la infeccin por el VIH. Si elige tomar medicamentos para prevenir el VIH, primero debe [employer] de deteccin del VIH. Luego debe hacerse anlisis cada 3 meses mientras est tomando los medicamentos. Embarazo Si est por dejar de Armed forces training and education officer (fase premenopusica) y usted puede quedar [place], busque asesoramiento antes de [place]. Tome de 400 a 800 microgramos (mcg) de cido [employer] si [place]. Pida mtodos de control de la natalidad (anticonceptivos) si desea evitar un embarazo no deseado. Osteoporosis y [place] La osteoporosis es una enfermedad en la que los huesos pierden los minerales y la fuerza por el avance de la edad. El resultado pueden ser fracturas en los [place]. Si tiene 65 aos o ms, o si est en riesgo de sufrir osteoporosis y fracturas, pregunte a su mdico si debe: Hacerse pruebas de deteccin de prdida sea. Tomar un suplemento de calcio o de vitamina D para reducir el riesgo de fracturas. Recibir terapia de reemplazo hormonal (TRH) para tratar los sntomas de la menopausia. Siga estas indicaciones en su casa: Consumo de alcohol No beba alcohol si: Su mdico le indica no hacerlo. Est embarazada, puede estar embarazada o est tratando de [place]. Si bebe alcohol: Limite la cantidad que bebe a lo siguiente: De 0 a 1 bebida por da. Sepa cunta cantidad de alcohol hay en las bebidas que toma. En los [address], una medida equivale a una botella de cerveza de 12 oz (355 ml), un vaso de vino de 5 oz (148 ml) o un vaso de una bebida alcohlica de alta graduacin de 1  oz (44 ml). Estilo de vida No consuma ningn producto que contenga nicotina o tabaco. Estos productos incluyen cigarrillos, tabaco para Theatre manager y aparatos de vapeo, como los Administrator, Civil Service. Si necesita ayuda para dejar de consumir estos productos, consulte al mdico. No consuma drogas. No comparta agujas. Solicite ayuda a su  mdico si necesita apoyo o informacin para abandonar las drogas. Indicaciones generales Realcese los estudios de rutina de [address], dentales y de Wellsite geologist. Mantngase al da con las vacunas. Infrmele a su mdico si: Se siente deprimida con frecuencia. Alguna vez ha sido vctima de [place] o no se siente seguro en su casa. Resumen Adoptar un estilo de vida saludable y recibir atencin preventiva son importantes para promover la salud y Counsellor. Siga las instrucciones del mdico acerca de una dieta saludable, el ejercicio y la realizacin de pruebas o exmenes para Hotel manager. Siga las instrucciones del mdico con respecto al control del colesterol y la presin arterial. Esta informacin no tiene Theme park manager el consejo del mdico. Asegrese de hacerle al mdico cualquier pregunta que tenga. Document Revised: 03/24/2021 Document Reviewed: 03/24/2021 Elsevier Patient Education  2024 Elsevier Inc.     Edwina Barth, MD [facility]

## 2024-02-04 NOTE — Patient Instructions (Signed)
 Mantenimiento de Radiographer, therapeutic en las mujeres Health Maintenance, Female Adoptar un estilo de vida saludable y recibir atencin preventiva son importantes para promover la salud y Counsellor. Consulte al mdico sobre: El esquema adecuado para hacerse pruebas y exmenes peridicos. Cosas que puede hacer por su cuenta para prevenir enfermedades y [place] sano. Qu debo saber sobre la dieta, el peso y el ejercicio? Consuma una dieta saludable  Consuma una dieta que incluya muchas verduras, frutas, productos lcteos con bajo contenido de [place] y Associate Professor. No consuma muchos alimentos ricos en grasas slidas, azcares agregados o sodio. Mantenga un peso saludable El ndice de masa muscular [facility]) se [place] para identificar problemas de [place]. Proporciona una estimacin de la grasa corporal basndose en el peso y la altura. Su mdico puede ayudarle a Engineer, site IMC y a Personnel officer o Pharmacologist un peso saludable. Haga ejercicio con regularidad Haga ejercicio con regularidad. Esta es una de las prcticas ms importantes que puede hacer por su salud. La [employer] de los adultos deben seguir estas pautas: Education officer, environmental, al menos, 150 minutos de actividad fsica por semana. El ejercicio debe aumentar la frecuencia cardaca y Media planner transpirar (ejercicio de intensidad moderada). Hacer ejercicios de fortalecimiento por lo [facility] por semana. Agregue esto a su plan de ejercicio de intensidad moderada. Pase menos tiempo sentada. Incluso la actividad fsica ligera puede ser beneficiosa. Controle sus niveles de colesterol y lpidos en la sangre Comience a realizarse anlisis de lpidos y Oncologist en la sangre a los 20 aos y luego reptalos cada 5 aos. Hgase controlar los niveles de colesterol con mayor frecuencia si: Sus niveles de lpidos y colesterol son altos. Es mayor de 40 aos. Presenta un alto riesgo de padecer enfermedades cardacas. Qu debo saber sobre las pruebas de deteccin del  cncer? Segn su historia clnica y sus antecedentes familiares, es posible que deba realizarse pruebas de deteccin del cncer en diferentes edades. Esto puede incluir pruebas de deteccin de lo siguiente: Cncer de mama. Cncer de cuello uterino. Cncer colorrectal. Cncer de piel. Cncer de pulmn. Qu debo saber sobre la enfermedad cardaca, la diabetes y la hipertensin arterial? Presin arterial y enfermedad cardaca La hipertensin arterial causa enfermedades cardacas y [place] el riesgo de accidente cerebrovascular. Es ms probable que esto se manifieste en las personas que tienen lecturas de presin arterial alta o tienen sobrepeso. Hgase controlar la presin arterial: Cada 3 a 5 aos si tiene entre 18 y 50 aos. Todos los aos si es mayor de 40 aos. Diabetes Realcese exmenes de deteccin de la diabetes con regularidad. Este anlisis revisa el nivel de azcar en la sangre en [place]. Hgase las pruebas de deteccin: Cada tres aos despus de los 40 aos de edad si tiene un peso normal y un bajo riesgo de padecer diabetes. Con ms frecuencia y a partir de [place] edad inferior si tiene sobrepeso o un alto riesgo de padecer diabetes. Qu debo saber sobre la prevencin de infecciones? Hepatitis B Si tiene un riesgo ms alto de contraer hepatitis B, debe someterse a un examen de deteccin de este virus. Hable con el mdico para averiguar si tiene riesgo de contraer la infeccin por hepatitis B. Hepatitis C Se recomienda el anlisis a: [employer] 1945 y 1965. Todas las personas que tengan un riesgo de haber contrado hepatitis C. Enfermedades de transmisin sexual (ETS) Hgase las pruebas de Airline pilot de ITS, incluidas la gonorrea y la clamidia, si: Es sexualmente activa y es Adult nurse de 24  aos. Es mayor de 24 aos, y el mdico le informa que corre riesgo de tener este tipo de infecciones. La actividad sexual ha cambiado desde que le hicieron la ltima prueba de  deteccin y tiene un riesgo mayor de Warehouse manager clamidia o Copy. Pregntele al mdico si usted tiene riesgo. Pregntele al mdico si usted tiene un alto riesgo de Primary school teacher VIH. El mdico tambin puede recomendarle un medicamento recetado para ayudar a evitar la infeccin por el VIH. Si elige tomar medicamentos para prevenir el VIH, primero debe [employer] de deteccin del VIH. Luego debe hacerse anlisis cada 3 meses mientras est tomando los medicamentos. Embarazo Si est por dejar de Armed forces training and education officer (fase premenopusica) y usted puede quedar [place], busque asesoramiento antes de [place]. Tome de 400 a 800 microgramos (mcg) de cido [employer] si [place]. Pida mtodos de control de la natalidad (anticonceptivos) si desea evitar un embarazo no deseado. Osteoporosis y [place] La osteoporosis es una enfermedad en la que los huesos pierden los minerales y la fuerza por el avance de la edad. El resultado pueden ser fracturas en los [place]. Si tiene 65 aos o ms, o si est en riesgo de sufrir osteoporosis y fracturas, pregunte a su mdico si debe: Hacerse pruebas de deteccin de prdida sea. Tomar un suplemento de calcio o de vitamina D para reducir el riesgo de fracturas. Recibir terapia de reemplazo hormonal (TRH) para tratar los sntomas de la menopausia. Siga estas indicaciones en su casa: Consumo de alcohol No beba alcohol si: Su mdico le indica no hacerlo. Est embarazada, puede estar embarazada o est tratando de [place]. Si bebe alcohol: Limite la cantidad que bebe a lo siguiente: De 0 a 1 bebida por da. Sepa cunta cantidad de alcohol hay en las bebidas que toma. En los [address], una medida equivale a una botella de cerveza de 12 oz (355 ml), un vaso de vino de 5 oz (148 ml) o un vaso de una bebida alcohlica de alta graduacin de 1 oz (44 ml). Estilo de vida No consuma ningn producto que contenga nicotina o tabaco. Estos  productos incluyen cigarrillos, tabaco para Theatre manager y aparatos de vapeo, como los Administrator, Civil Service. Si necesita ayuda para dejar de consumir estos productos, consulte al mdico. No consuma drogas. No comparta agujas. Solicite ayuda a su mdico si necesita apoyo o informacin para abandonar las drogas. Indicaciones generales Realcese los estudios de rutina de [address], dentales y de Wellsite geologist. Mantngase al da con las vacunas. Infrmele a su mdico si: Se siente deprimida con frecuencia. Alguna vez ha sido vctima de [place] o no se siente seguro en su casa. Resumen Adoptar un estilo de vida saludable y recibir atencin preventiva son importantes para promover la salud y Counsellor. Siga las instrucciones del mdico acerca de una dieta saludable, el ejercicio y la realizacin de pruebas o exmenes para Hotel manager. Siga las instrucciones del mdico con respecto al control del colesterol y la presin arterial. Esta informacin no tiene Theme park manager el consejo del mdico. Asegrese de hacerle al mdico cualquier pregunta que tenga. Document Revised: 03/24/2021 Document Reviewed: 03/24/2021 Elsevier Patient Education  2024 [employer].

## 2024-03-24 ENCOUNTER — Ambulatory Visit
Admission: EM | Admit: 2024-03-24 | Discharge: 2024-03-24 | Disposition: A | Discharge status: Home / Self Care | Payer: Self-pay | Attending: Family Medicine | Admitting: Family Medicine

## 2024-03-24 ENCOUNTER — Other Ambulatory Visit: Payer: Self-pay

## 2024-03-24 DIAGNOSIS — J029 Acute pharyngitis, unspecified: Secondary | ICD-10-CM

## 2024-03-24 DIAGNOSIS — H6991 Unspecified Eustachian tube disorder, right ear: Secondary | ICD-10-CM

## 2024-03-24 MED ORDER — AMOXICILLIN-POT CLAVULANATE 875-125 MG PO TABS
1.0000 | ORAL_TABLET | Freq: Two times a day (BID) | ORAL | 0 refills | Status: DC
Start: 2024-03-24 — End: 2024-08-18

## 2024-03-24 NOTE — Discharge Instructions (Signed)
 Start Augmentin  twice daily for 7 days.  Start Flonase  daily to help with your ear pain.  Also restart your allergy medicine over-the-counter daily.  Lots of rest and fluids.  Please follow-up with your PCP if your symptoms do not improve.  Please go to the ER for any worsening symptoms.  Hope you feel better soon!

## 2024-03-24 NOTE — ED Provider Notes (Addendum)
 [facility]    CSN: [id number] Arrival date & time: 03/24/24  1013      History   Chief Complaint Chief Complaint  Patient presents with   Sore Throat   Otalgia    HPI Chelsea Fernandez is a 33 y.o. female  presents for evaluation of URI symptoms for 7 days. Patient reports associated symptoms of right sided sore throat, right ear pain.  States symptoms began after she ate a burger and felt something scraped the back of her throat.  Denies N/V/D, cough, congestion, fevers, body aches, shortness of breath. Patient does  have a hx of asthma.  Denies any asthma exacerbation symptoms with current symptoms.  Patient is not an active smoker.   Reports no sick contacts.  Pt has taken nothing OTC for symptoms.  Denies pregnancy or breast-feeding.  Pt has no other concerns at this time.    Sore Throat  Otalgia Associated symptoms: sore throat     Past Medical History:  Diagnosis Date   Acid reflux    Anxiety    ASCUS of cervix with negative high risk HPV    Asthma    CIN II (cervical intraepithelial neoplasia II)    Family history of adverse reaction to anesthesia    aunt has high tolerance to anesthesia  will not put her tosleep   Headache    migraine rarely   History of kidney stones    Idiopathic urticaria 01/15/2023   right renal ca dx'd 01/2018   Kidney cancer    Patient Active Problem List   Diagnosis Date Noted   Mild intermittent asthma without complication 01/15/2023   Idiopathic urticaria 01/15/2023   Perennial allergic rhinitis 01/15/2023   Allergic dermatitis 01/02/2023   Chronic heartburn 01/04/2021   Renal carcinoma, right (HCC) 05/12/2020   Seasonal allergic rhinitis 03/10/2020   Renal mass 04/24/2018   Dysplasia of cervix, 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   ASCUS with positive high risk HPV cervical 10/04/2016   Hyperlipidemia 09/05/2016   Fluctuation of weight 09/04/2016    Past Surgical History:  Procedure Laterality Date   DILATION AND  CURETTAGE OF UTERUS  2012   NOSE SURGERY     ROBOTIC ASSITED PARTIAL NEPHRECTOMY Right 04/24/2018   Procedure: ROBOTIC ASSITED NEPHRECTOMY;  Surgeon: Adelbert Homans, MD;  Location: [facility];  Service: Urology;  Laterality: Right;    OB History     Gravida  1   Para  0   Term  0   Preterm  0   AB  1   Living         SAB  1   IAB  0   Ectopic  0   Multiple      Live Births               Home Medications    Prior to Admission medications   Medication Sig Start Date End Date Taking? Authorizing Provider  amoxicillin -clavulanate (AUGMENTIN ) 875-125 MG tablet Take 1 tablet by mouth every 12 (twelve) hours. 03/24/24  Yes Skyleigh Windle, Jodi R, NP  Albuterol  Sulfate (PROAIR  RESPICLICK) 108 (90 Base) MCG/ACT AEPB Inhale 2 puffs into the lungs every 4 (four) hours as needed. For cough or wheeze 01/15/23   Ardie Kras, FNP  diphenhydrAMINE  HCl (BENADRYL  ALLERGY PO) Take by mouth as needed. Patient not taking: Reported on 02/04/2024    [provider]  fluticasone  (FLOVENT  HFA) 44 MCG/ACT inhaler For asthma flare, begin fluticasone  44-2 puffs twice a  day for 1 to 2 weeks or until cough and wheeze free. Patient not taking: Reported on 02/04/2024 01/15/23   Ardie Kras, FNP  nystatin -triamcinolone  ointment (MYCOLOG) Apply 1 Application topically 2 (two) times daily. Use twice daily for one week as needed/ Patient not taking: Reported on 02/04/2024 11/01/23   Greta Leatherwood, MD    Family History Family History  Adopted: Yes  Problem Relation Age of Onset   Hypertension Mother    Stomach cancer Maternal Grandmother    Cancer Maternal Grandmother         STOMACH    Hypertension Maternal Aunt    Hypertension Maternal Uncle    Colon cancer Neg Hx    Esophageal cancer Neg Hx    Rectal cancer Neg Hx     Social History Social History   Tobacco Use   Smoking status: Never   Smokeless tobacco: Never  Vaping Use   Vaping status: Never Used  Substance Use  Topics   Alcohol use: No   Drug use: No     Allergies   Other   Review of Systems Review of Systems  HENT:  Positive for ear pain and sore throat.      Physical Exam Triage Vital Signs ED Triage Vitals  Encounter Vitals Group     BP 03/24/24 1021 113/80     Systolic BP Percentile --      Diastolic BP Percentile --      Pulse Rate 03/24/24 1021 75     Resp 03/24/24 1021 16     Temp 03/24/24 1021 98.9 F (37.2 C)     Temp Source 03/24/24 1021 Oral     SpO2 03/24/24 1021 98 %     Weight --      Height --      Head Circumference --      Peak Flow --      Pain Score 03/24/24 1019 5     Pain Loc --      Pain Education --      Exclude from Growth Chart --    No data found.  Updated Vital Signs BP 113/80   Pulse 75   Temp 98.9 F (37.2 C) (Oral)   Resp 16   LMP 02/28/2024   SpO2 98%   Visual Acuity Right Eye Distance:   Left Eye Distance:   Bilateral Distance:    Right Eye Near:   Left Eye Near:    Bilateral Near:     Physical Exam Vitals and nursing note reviewed.  Constitutional:      General: She is not in acute distress.    Appearance: She is well-developed. She is not ill-appearing.  HENT:     Head: Normocephalic and atraumatic.     Right Ear: Ear canal normal. A middle ear effusion is present.     Left Ear: Tympanic membrane and ear canal normal.     Nose: No congestion or rhinorrhea.     Mouth/Throat:     Mouth: Mucous membranes are moist.     Pharynx: Oropharynx is clear. Uvula midline. Posterior oropharyngeal erythema present. No uvula swelling.     Tonsils: No tonsillar exudate or tonsillar abscesses.  Eyes:     Conjunctiva/sclera: Conjunctivae normal.     Pupils: Pupils are equal, round, and reactive to light.  Cardiovascular:     Rate and Rhythm: Normal rate and regular rhythm.     Heart sounds: Normal heart sounds.  Pulmonary:  Effort: Pulmonary effort is normal.     Breath sounds: Normal breath sounds.  Musculoskeletal:      Cervical back: Normal range of motion and neck supple.  Lymphadenopathy:     Cervical: No cervical adenopathy.  Skin:    General: Skin is warm and dry.  Neurological:     General: No focal deficit present.     Mental Status: She is alert and oriented to person, place, and time.  Psychiatric:        Mood and Affect: Mood normal.        Behavior: Behavior normal.      UC Treatments / Results  Labs (all labs ordered are listed, but only abnormal results are displayed) Labs Reviewed - No data to display  EKG   Radiology No results found.  Procedures Procedures (including critical care time)  Medications Ordered in UC Medications - No data to display  Initial Impression / Assessment and Plan / UC Course  I have reviewed the triage vital signs and the nursing notes.  Pertinent labs & imaging results that were available during my care of the patient were reviewed by me and considered in my medical decision making (see chart for details).     Reviewed exam and symptoms with patient.  No red flags.  Given length of symptoms with concern for possible injury will start Augmentin .  Advised Flonase  and OTC allergy medicine as well.  Discussed salt water  gargles and warm liquids as well as OTC analgesics as needed.  PCP follow-up if symptoms do not improve.  ER precautions reviewed. Final Clinical Impressions(s) / UC Diagnoses   Final diagnoses:  Acute pharyngitis, unspecified etiology  Dysfunction of right eustachian tube   Discharge Instructions      Start Augmentin  twice daily for 7 days.  Start Flonase  daily to help with your ear pain.  Also restart your allergy medicine over-the-counter daily.  Lots of rest and fluids.  Please follow-up with your PCP if your symptoms do not improve.  Please go to the ER for any worsening symptoms.  Hope you feel better soon!  ED Prescriptions     Medication Sig Dispense Auth. Provider   amoxicillin -clavulanate (AUGMENTIN ) 875-125 MG  tablet Take 1 tablet by mouth every 12 (twelve) hours. 14 tablet Brianna Esson, Jodi R, NP      PDMP not reviewed this encounter.   Alleen Arbour, NP 03/24/24 1038    Alleen Arbour, NP 03/24/24 1039

## 2024-03-24 NOTE — ED Triage Notes (Signed)
 Pt c/o sore throat, dysphagia and right ear painx1wk. Pt states 8 days ago she ate a burger and felt something sharp when she swallowed it and the next day is when her throat started hurting

## 2024-03-28 ENCOUNTER — Encounter (HOSPITAL_COMMUNITY): Payer: Self-pay | Admitting: *Deleted

## 2024-03-28 ENCOUNTER — Ambulatory Visit (HOSPITAL_COMMUNITY)
Admission: EM | Admit: 2024-03-28 | Discharge: 2024-03-28 | Disposition: A | Discharge status: Home / Self Care | Payer: Self-pay | Attending: Emergency Medicine | Admitting: Emergency Medicine

## 2024-03-28 DIAGNOSIS — M542 Cervicalgia: Secondary | ICD-10-CM | POA: Insufficient documentation

## 2024-03-28 DIAGNOSIS — R070 Pain in throat: Secondary | ICD-10-CM | POA: Insufficient documentation

## 2024-03-28 MED ORDER — PREDNISONE 20 MG PO TABS: 40.0000 mg | ORAL_TABLET | Freq: Every day | ORAL | 0 refills | Status: AC

## 2024-03-28 MED ORDER — DEXAMETHASONE SODIUM PHOSPHATE 10 MG/ML IJ SOLN
10.0000 mg | Freq: Once | INTRAMUSCULAR | Status: AC
  Administered 2024-03-28: 10 mg via INTRAMUSCULAR

## 2024-03-28 MED ORDER — METHOCARBAMOL 500 MG PO TABS: 500.0000 mg | ORAL_TABLET | Freq: Two times a day (BID) | ORAL | 0 refills | Status: DC

## 2024-03-28 MED ORDER — DEXAMETHASONE SODIUM PHOSPHATE 10 MG/ML IJ SOLN
INTRAMUSCULAR | Status: AC
  Filled 2024-03-28: qty 1

## 2024-03-28 NOTE — Discharge Instructions (Addendum)
 We have given you a steroid shot to help reduce pain and inflammation.  Tomorrow starting with breakfast take the oral steroids.  Tonight before bed he can take the Robaxin , if this helps with your neck pain and stiffness you can take this up to 2 times daily.  Do not drink alcohol or drive on this medication as it may cause drowsiness or sedation. Heat and warm compresses can help loosen up stiff muscles as well.  The HSV swab will result over the next few days and you will be contacted if the results are abnormal.  Continue your antibiotics as prescribed.  If no improvement by Monday follow-up with your primary care provider.

## 2024-03-28 NOTE — ED Triage Notes (Signed)
 Pt states that she has had neck pain that radiates to her back. She states she was seen on 03/24/2024 for this same issue but she took the meds given at that visit with no relief. She is still on antibiotics given at that visit.

## 2024-03-28 NOTE — ED Provider Notes (Signed)
 [facility]    CSN: [id number] Arrival date & time: 03/28/24  1741      History   Chief Complaint Chief Complaint  Patient presents with   Neck Pain    HPI Chelsea Fernandez is a 33 y.o. female.   Patient presents to clinic over concern of right sided neck pain.  This has been present for the past 2 weeks after she ate a cheeseburger and felt like there was something abnormal in the cheeseburger.  She thought the pain was getting better until it returned.  She was seen in clinic for acute pharyngitis and treated with Augmentin , has taken about 5 days of this.  Now has right sided neck pain and pain when she lifts her right arm.  Foreign body sensation on the right throat.  Continues to have right ear pain.  Feels like the right side of her face is swollen.  Has not had any fevers.  The history is provided by the patient and medical records.  Neck Pain   Past Medical History:  Diagnosis Date   Acid reflux    Anxiety    ASCUS of cervix with negative high risk HPV    Asthma    CIN II (cervical intraepithelial neoplasia II)    Family history of adverse reaction to anesthesia    aunt has high tolerance to anesthesia  will not put her tosleep   Headache    migraine rarely   History of kidney stones    Idiopathic urticaria 01/15/2023   right renal ca dx'd 01/2018   Kidney cancer    Patient Active Problem List   Diagnosis Date Noted   Mild intermittent asthma without complication 01/15/2023   Idiopathic urticaria 01/15/2023   Perennial allergic rhinitis 01/15/2023   Allergic dermatitis 01/02/2023   Chronic heartburn 01/04/2021   Renal carcinoma, right (HCC) 05/12/2020   Seasonal allergic rhinitis 03/10/2020   Renal mass 04/24/2018   Dysplasia of cervix, 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   ASCUS with positive high risk HPV cervical 10/04/2016   Hyperlipidemia 09/05/2016   Fluctuation of weight 09/04/2016    Past Surgical History:  Procedure Laterality Date    DILATION AND CURETTAGE OF UTERUS  2012   NOSE SURGERY     ROBOTIC ASSITED PARTIAL NEPHRECTOMY Right 04/24/2018   Procedure: ROBOTIC ASSITED NEPHRECTOMY;  Surgeon: Adelbert Homans, MD;  Location: [facility];  Service: Urology;  Laterality: Right;    OB History     Gravida  1   Para  0   Term  0   Preterm  0   AB  1   Living         SAB  1   IAB  0   Ectopic  0   Multiple      Live Births               Home Medications    Prior to Admission medications   Medication Sig Start Date End Date Taking? Authorizing Provider  amoxicillin -clavulanate (AUGMENTIN ) 875-125 MG tablet Take 1 tablet by mouth every 12 (twelve) hours. 03/24/24  Yes Mayer, Jodi R, NP  methocarbamol  (ROBAXIN ) 500 MG tablet Take 1 tablet (500 mg total) by mouth 2 (two) times daily. 03/28/24  Yes Hayden Kihara  N, FNP  predniSONE  (DELTASONE ) 20 MG tablet Take 2 tablets (40 mg total) by mouth daily with breakfast for 5 days. 03/28/24 04/02/24 Yes Damiya Sandefur  N, FNP  Albuterol  Sulfate (PROAIR  RESPICLICK) 108 (90 Base) MCG/ACT  AEPB Inhale 2 puffs into the lungs every 4 (four) hours as needed. For cough or wheeze 01/15/23   Ambs, Jeanmarie Millet, FNP  diphenhydrAMINE  HCl (BENADRYL  ALLERGY PO) Take by mouth as needed. Patient not taking: Reported on 02/04/2024    [provider]  fluticasone  (FLOVENT  HFA) 44 MCG/ACT inhaler For asthma flare, begin fluticasone  44-2 puffs twice a day for 1 to 2 weeks or until cough and wheeze free. Patient not taking: Reported on 02/04/2024 01/15/23   Ardie Kras, FNP  nystatin -triamcinolone  ointment (MYCOLOG) Apply 1 Application topically 2 (two) times daily. Use twice daily for one week as needed/ Patient not taking: Reported on 02/04/2024 11/01/23   Greta Leatherwood, MD    Family History Family History  Adopted: Yes  Problem Relation Age of Onset   Hypertension Mother    Stomach cancer Maternal Grandmother    Cancer Maternal Grandmother         STOMACH     Hypertension Maternal Aunt    Hypertension Maternal Uncle    Colon cancer Neg Hx    Esophageal cancer Neg Hx    Rectal cancer Neg Hx     Social History Social History   Tobacco Use   Smoking status: Never   Smokeless tobacco: Never  Vaping Use   Vaping status: Never Used  Substance Use Topics   Alcohol use: No   Drug use: No     Allergies   Other   Review of Systems Review of Systems  Per HPI  Physical Exam Triage Vital Signs ED Triage Vitals  Encounter Vitals Group     BP 03/28/24 1756 128/80     Systolic BP Percentile --      Diastolic BP Percentile --      Pulse Rate 03/28/24 1756 98     Resp 03/28/24 1756 18     Temp 03/28/24 1756 98.3 F (36.8 C)     Temp Source 03/28/24 1756 Oral     SpO2 03/28/24 1756 97 %     Weight --      Height --      Head Circumference --      Peak Flow --      Pain Score 03/28/24 1755 7     Pain Loc --      Pain Education --      Exclude from Growth Chart --    No data found.  Updated Vital Signs BP 128/80 (BP Location: Right Arm)   Pulse 98   Temp 98.3 F (36.8 C) (Oral)   Resp 18   LMP 02/28/2024   SpO2 97%   Visual Acuity Right Eye Distance:   Left Eye Distance:   Bilateral Distance:    Right Eye Near:   Left Eye Near:    Bilateral Near:     Physical Exam Vitals and nursing note reviewed.  Constitutional:      Appearance: Normal appearance.  HENT:     Head: Normocephalic and atraumatic.     Right Ear: External ear normal.     Left Ear: External ear normal.     Nose: Nose normal.     Mouth/Throat:     Mouth: Mucous membranes are moist.     Pharynx: Posterior oropharyngeal erythema present.  Eyes:     Conjunctiva/sclera: Conjunctivae normal.  Neck:      Comments: Diffuse right sided ear pain, jaw pain, neck pain that goes down into the collarbone and the trapezius area.  Area is mildly tender to palpation.  No crepitus or overlying skin changes. Cardiovascular:     Rate and Rhythm: Normal rate.   Pulmonary:     Effort: Pulmonary effort is normal. No respiratory distress.  Musculoskeletal:        General: Normal range of motion.     Cervical back: Tenderness present. Muscular tenderness present.  Skin:    General: Skin is warm and dry.  Neurological:     General: No focal deficit present.     Mental Status: She is alert.  Psychiatric:        Mood and Affect: Mood normal.      UC Treatments / Results  Labs (all labs ordered are listed, but only abnormal results are displayed) Labs Reviewed  HSV 1/2 PCR (SURFACE)    EKG   Radiology No results found.  Procedures Procedures (including critical care time)  Medications Ordered in UC Medications  dexamethasone  (DECADRON ) injection 10 mg (has no administration in time range)    Initial Impression / Assessment and Plan / UC Course  I have reviewed the triage vital signs and the nursing notes.  Pertinent labs & imaging results that were available during my care of the patient were reviewed by me and considered in my medical decision making (see chart for details).  Vitals in triage reviewed, patient is hemodynamically stable.  Able to speak in full sentences.  Posterior pharynx with erythema, tonsils without exudate.  Right sided facial, neck and collarbone tenderness to palpation.  Pain in neck is exacerbated when patient lifts her right arm, concerning for musculoskeletal etiology.  Will trial muscle relaxers and steroids.  Encouraged continuation of antibiotics until completion for treatment of any potential bacterial infection.  Encourage PCP follow-up if symptoms persist.  Plan of care, follow-up care return precautions given, no questions at this time.     Final Clinical Impressions(s) / UC Diagnoses   Final diagnoses:  Pain in throat  Neck pain on right side     Discharge Instructions      We have given you a steroid shot to help reduce pain and inflammation.  Tomorrow starting with breakfast take  the oral steroids.  Tonight before bed he can take the Robaxin , if this helps with your neck pain and stiffness you can take this up to 2 times daily.  Do not drink alcohol or drive on this medication as it may cause drowsiness or sedation. Heat and warm compresses can help loosen up stiff muscles as well.  The HSV swab will result over the next few days and you will be contacted if the results are abnormal.  Continue your antibiotics as prescribed.  If no improvement by Monday follow-up with your primary care provider.   ED Prescriptions     Medication Sig Dispense Auth. Provider   methocarbamol  (ROBAXIN ) 500 MG tablet Take 1 tablet (500 mg total) by mouth 2 (two) times daily. 20 tablet Harlow Lighter, Haniel Fix  N, FNP   predniSONE  (DELTASONE ) 20 MG tablet Take 2 tablets (40 mg total) by mouth daily with breakfast for 5 days. 10 tablet Harlow Lighter, Tavie Haseman  N, FNP      PDMP not reviewed this encounter.   Harlow Lighter, Velvie Thomaston  N, FNP 03/28/24 [phone]

## 2024-03-29 LAB — HSV 1/2 PCR (SURFACE)
HSV-1 DNA: NOT DETECTED
HSV-2 DNA: NOT DETECTED

## 2024-03-31 ENCOUNTER — Ambulatory Visit: Payer: Self-pay | Admitting: Emergency Medicine

## 2024-04-07 ENCOUNTER — Ambulatory Visit: Payer: Self-pay | Admitting: Emergency Medicine

## 2024-04-21 ENCOUNTER — Ambulatory Visit: Payer: Self-pay | Admitting: Emergency Medicine

## 2024-04-28 ENCOUNTER — Ambulatory Visit: Payer: Self-pay | Admitting: Emergency Medicine

## 2024-06-02 ENCOUNTER — Ambulatory Visit: Payer: Self-pay | Admitting: Obstetrics and Gynecology

## 2024-06-09 ENCOUNTER — Ambulatory Visit: Payer: Self-pay | Admitting: Emergency Medicine

## 2024-06-09 ENCOUNTER — Ambulatory Visit (INDEPENDENT_AMBULATORY_CARE_PROVIDER_SITE_OTHER): Payer: Self-pay | Admitting: Emergency Medicine

## 2024-06-09 ENCOUNTER — Ambulatory Visit: Payer: Self-pay

## 2024-06-09 ENCOUNTER — Encounter: Payer: Self-pay | Admitting: Emergency Medicine

## 2024-06-09 VITALS — BP 112/72 | HR 68 | Temp 98.8°F | Ht 60.0 in | Wt 160.0 lb

## 2024-06-09 DIAGNOSIS — M7918 Myalgia, other site: Secondary | ICD-10-CM | POA: Insufficient documentation

## 2024-06-09 DIAGNOSIS — M62830 Muscle spasm of back: Secondary | ICD-10-CM | POA: Insufficient documentation

## 2024-06-09 MED ORDER — CYCLOBENZAPRINE HCL 10 MG PO TABS: 10.0000 mg | ORAL_TABLET | Freq: Every day | ORAL | 0 refills | Status: AC

## 2024-06-09 MED ORDER — MELOXICAM 15 MG PO TABS: 15.0000 mg | ORAL_TABLET | Freq: Every day | ORAL | 0 refills | Status: AC

## 2024-06-09 NOTE — Assessment & Plan Note (Signed)
 Trapezius muscle spasm very tender to palpation. Pain reproduced with palpation Pain management discussed Recommend Flexeril  10 mg at bedtime Meloxicam  15 mg daily for 10 days Heat pad to the area after work

## 2024-06-09 NOTE — Progress Notes (Signed)
 Chelsea Fernandez 33 y.o.   Chief Complaint  Patient presents with   Muscle Pain    Patient here for muscle pain and neck pain. She states mainly on her right shoulder/arm that does move across her clavicle to her left side of her shoulder / arm. Pain is sore like but with movement its sharpe that is consistent . She went to UC on 5/30 because the pain being so sharpe she felt it in her throat. Pt states she had some muscle relaxer left from the UC and has only been using that and only helps a little.     HISTORY OF PRESENT ILLNESS: This is a 33 y.o. female complaining of pain and muscle spasm to right upper back radiating to shoulder and arm.  Sometimes most to left upper back and shoulder Worse with movement Chronic problem for the past 3 to 4 months.  Physical and stressful work 6 to 7 days a week. No other associated symptoms No other complaints or medical concerns today.  Muscle Pain Pertinent negatives include no abdominal pain, chest pain, diarrhea, dysuria, fever, headaches, nausea, rash, shortness of breath or vomiting.     Prior to Admission medications   Medication Sig Start Date End Date Taking? Authorizing Provider  cyclobenzaprine  (FLEXERIL ) 10 MG tablet Take 1 tablet (10 mg total) by mouth at bedtime. 06/09/24  Yes Jayesh Marbach, Emil Schanz, MD  meloxicam  (MOBIC ) 15 MG tablet Take 1 tablet (15 mg total) by mouth daily for 10 days. 06/09/24 06/19/24 Yes Adryen Cookson, Emil Schanz, MD  Albuterol  Sulfate (PROAIR  RESPICLICK) 108 (90 Base) MCG/ACT AEPB Inhale 2 puffs into the lungs every 4 (four) hours as needed. For cough or wheeze Patient not taking: Reported on 06/09/2024 01/15/23   Cari Arlean HERO, FNP  amoxicillin -clavulanate (AUGMENTIN ) 875-125 MG tablet Take 1 tablet by mouth every 12 (twelve) hours. Patient not taking: Reported on 06/09/2024 03/24/24   Mayer, Jodi R, NP  diphenhydrAMINE  HCl (BENADRYL  ALLERGY PO) Take by mouth as needed. Patient not taking: Reported on 06/09/2024     [provider]  fluticasone  (FLOVENT  HFA) 44 MCG/ACT inhaler For asthma flare, begin fluticasone  44-2 puffs twice a day for 1 to 2 weeks or until cough and wheeze free. Patient not taking: Reported on 06/09/2024 01/15/23   Cari Arlean HERO, FNP  nystatin -triamcinolone  ointment (MYCOLOG) Apply 1 Application topically 2 (two) times daily. 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   ASCUS with positive high risk HPV cervical 10/04/2016   Hyperlipidemia 09/05/2016   Fluctuation of weight 09/04/2016    Past Medical History:  Diagnosis Date   Acid reflux    Anxiety    ASCUS of cervix with negative high risk HPV    Asthma    CIN II (cervical intraepithelial neoplasia II)    Family history of adverse reaction to anesthesia    aunt has high tolerance to anesthesia  will not put her  tosleep   Headache    migraine rarely   History of kidney stones    Idiopathic urticaria 01/15/2023   right renal ca dx'd 01/2018   Kidney cancer    Past Surgical History:  Procedure Laterality Date   DILATION AND CURETTAGE OF UTERUS  2012   NOSE SURGERY     ROBOTIC ASSITED PARTIAL NEPHRECTOMY Right 04/24/2018   Procedure: ROBOTIC ASSITED NEPHRECTOMY;  Surgeon: Devere Lonni Righter, MD;  Location: [facility];  Service: Urology;  Laterality: Right;    Social History   Socioeconomic History   Marital status: Single    Spouse name: Not on file   Number  of children: Not on file   Years of education: Not on file   Highest education level: Not on file  Occupational History   Not on file  Tobacco Use   Smoking status: Never   Smokeless tobacco: Never  Vaping Use   Vaping status: Never Used  Substance and Sexual Activity   Alcohol use: No   Drug use: No   Sexual activity: Not Currently    Partners: Male    Birth control/protection: Abstinence    Comment: 1ST INTERCOURSE- 17, Fewer than 5 partners  Other Topics Concern   Not on file  Social History Narrative   ** Merged History Encounter **       Social Drivers of Health   Financial Resource Strain: Medium Risk (04/21/2024)   Overall Financial Resource Strain (CARDIA)    Difficulty of Paying Living Expenses: Somewhat hard  Food Insecurity: No Food Insecurity (04/21/2024)   Hunger Vital Sign    Worried About Running Out of Food in the Last Year: Never true    Ran Out of Food in the Last Year: Never true  Transportation Needs: Unmet Transportation Needs (04/21/2024)   PRAPARE - Transportation    Lack of Transportation (Medical): Yes    Lack of Transportation (Non-Medical): Patient declined  Physical Activity: Unknown (04/21/2024)   Exercise Vital Sign    Days of Exercise per Week: Patient declined    Minutes of Exercise per Session: Not on file  Stress: No Stress Concern Present (04/21/2024)   [employer] of Occupational Health - Occupational Stress Questionnaire    Feeling of Stress: Only a little  Social Connections: Moderately Integrated (04/21/2024)   Social Connection and Isolation Panel    Frequency of Communication with Friends and Family: More than three times a week    Frequency of Social Gatherings with Friends and Family: Once a week    Attends Religious Services: More than 4 times per year    Active Member of [employer] or Organizations: Yes    Attends Banker Meetings: More than 4 times per year    Marital Status: Never married  Intimate Partner  Violence: Unknown (01/30/2022)   Received from [facility]   HITS    Physically Hurt: Not on file    Insult or Talk Down To: Not on file    Threaten Physical Harm: Not on file    Scream or Curse: Not on file    Family History  Adopted: Yes  Problem Relation Age of Onset   Hypertension Mother    Stomach cancer Maternal Grandmother    Cancer Maternal Grandmother         STOMACH    Hypertension Maternal Aunt    Hypertension Maternal Uncle    Colon cancer Neg Hx    Esophageal cancer Neg Hx    Rectal cancer Neg  Hx      Review of Systems  Constitutional: Negative.  Negative for chills and fever.  HENT: Negative.  Negative for congestion and sore throat.   Respiratory: Negative.  Negative for cough and shortness of breath.   Cardiovascular: Negative.  Negative for chest pain and palpitations.  Gastrointestinal:  Negative for abdominal pain, diarrhea, nausea and vomiting.  Genitourinary: Negative.  Negative for dysuria and hematuria.  Musculoskeletal:  Positive for back pain and neck pain.  Skin: Negative.  Negative for rash.  Neurological: Negative.  Negative for dizziness and headaches.  All other systems reviewed and are negative.   Vitals:   06/09/24 1014  BP: 112/72  Pulse: 68  Temp: 98.8 F (37.1 C)  SpO2: 98%    Physical Exam Vitals reviewed.  Constitutional:      Appearance: Normal appearance.  HENT:     Head: Normocephalic.     Mouth/Throat:     Mouth: Mucous membranes are moist.     Pharynx: Oropharynx is clear.     Comments: Chronically enlarged tonsils bilaterally Eyes:     Extraocular Movements: Extraocular movements intact.     Pupils: Pupils are equal, round, and reactive to light.  Cardiovascular:     Rate and Rhythm: Normal rate and regular rhythm.     Pulses: Normal pulses.     Heart sounds: Normal heart sounds.  Pulmonary:     Effort: Pulmonary effort is normal.     Breath sounds: Normal breath sounds.  Abdominal:     Palpations: Abdomen  is soft.     Tenderness: There is no abdominal tenderness.  Musculoskeletal:     Comments: Tender trapezius muscle with spasm right more than left  Skin:    General: Skin is warm and dry.     Capillary Refill: Capillary refill takes less than 2 seconds.  Neurological:     General: No focal deficit present.     Mental Status: She is alert and oriented to person, place, and time.  Psychiatric:        Mood and Affect: Mood normal.        Behavior: Behavior normal.      ASSESSMENT & PLAN: Problem List Items Addressed This Visit       Other   Back muscle spasm - Primary   Trapezius muscle spasm very tender to palpation. Pain reproduced with palpation Pain management discussed Recommend Flexeril  10 mg at bedtime Meloxicam  15 mg daily for 10 days Heat pad to the area after work      Relevant Medications   cyclobenzaprine  (FLEXERIL ) 10 MG tablet   meloxicam  (MOBIC ) 15 MG tablet   Other Relevant Orders   DG Chest 2 View   Musculoskeletal pain   Related to activities of daily living and work Pain management discussed Recommend Tylenol  as needed Daily meloxicam  15 mg for 10 days Heat pad to the back after work daily      Relevant Medications   cyclobenzaprine  (FLEXERIL ) 10 MG tablet   meloxicam  (MOBIC ) 15 MG tablet   Other Relevant Orders   DG Chest 2 View   Patient Instructions  Spasticity Spasticity is a condition in which your muscles contract suddenly and unpredictably (spasm). Spasticity usually affects your arms, legs, or back. It can also affect the way you walk. Spasticity can range from mild muscle stiffness and tightness to severe, uncontrollable muscle spasms. Severe spasticity can be painful and can freeze your muscles in an uncomfortable position. Follow these instructions at home:  Managing muscle stiffness and spasms     Wear a brace as told by your health care provider to prevent muscle contractions. Have the affected muscles massaged. If directed,  apply heat to the affected muscle area. Use the heat source that your health care provider recommends, such as a moist heat pack or heating pad. Place a towel between your skin and the heat source. Leave the heat on for 20-30 minutes. Remove the heat if your skin turns bright red. This is especially important if you are unable to feel pain, heat, or cold. You may have a greater risk of getting burned. If directed, apply ice to the affected muscle area: Put ice in a plastic bag. Place a towel between your skin and the bag or between your brace and the bag. Leave the ice on for 20 minutes, 2?3 times a day. Activity Stay active as directed by your health care provider. Find a safe exercise program that fits your needs and ability. Maintain good posture when walking and sitting. Work with a physical therapist to learn exercises that will stretch and strengthen your muscles. Do stretching and range-of-motion exercises at home as told by a physical therapist. Work with an occupational therapist. This type of health care provider can help you function better at home and at work. If you have severe spasticity, use mobility aids, such as a walker or cane, as told by your health care provider. General instructions Watch your condition for any changes. Wear loose, comfortable clothing that does not restrict your movement. Wear closed-toe shoes that fit well and support your feet. Wear shoes that have rubber soles or low heels. Take over-the-counter and prescription medicine only as told by your health care provider. Keep all follow-up visits as told by your health care provider. This is important. Contact a health care provider if you: Have worsening muscle spasms. Develop other symptoms along with spasticity. Have a fever or chills. Experience a burning feeling when you pass urine. Become constipated. Need more support at home. Get help right away if you: Have trouble breathing. Have a muscle  spasm that freezes you into a painful position. Cannot walk. Cannot care for yourself at home. Have trouble passing urine or have urinary incontinence. Summary Spasticity is a condition in which your muscles contract suddenly and unpredictably (spasm). Spasticity usually affects your arms, legs, or back. Spasticity can range from mild muscle stiffness and tightness to severe, uncontrollable muscle spasms. Do stretching and range-of-motion exercises at home as told by a physical therapist. Take over-the-counter and prescription medicine only as told by your health care provider. This information is not intended to replace advice given to you by your health care provider. Make sure you discuss any questions you have with your health care provider. Document Revised: 10/09/2022 Document Reviewed: 10/09/2022 Elsevier Patient Education  2024 Elsevier Inc.     Emil Schaumann, MD [facility]

## 2024-06-09 NOTE — Assessment & Plan Note (Signed)
 Related to activities of daily living and work Pain management discussed Recommend Tylenol  as needed Daily meloxicam  15 mg for 10 days Heat pad to the back after work daily

## 2024-06-09 NOTE — Patient Instructions (Signed)
Spasticity Spasticity is a condition in which your muscles contract suddenly and unpredictably (spasm). Spasticity usually affects your arms, legs, or back. It can also affect the way you walk. Spasticity can range from mild muscle stiffness and tightness to severe, uncontrollable muscle spasms. Severe spasticity can be painful and can freeze your muscles in an uncomfortable position. Follow these instructions at home: Managing muscle stiffness and spasms     Wear a brace as told by your health care provider to prevent muscle contractions. Have the affected muscles massaged. If directed, apply heat to the affected muscle area. Use the heat source that your health care provider recommends, such as a moist heat pack or heating pad. Place a towel between your skin and the heat source. Leave the heat on for 20-30 minutes. Remove the heat if your skin turns bright red. This is especially important if you are unable to feel pain, heat, or cold. You may have a greater risk of getting burned. If directed, apply ice to the affected muscle area: Put ice in a plastic bag. Place a towel between your skin and the bag or between your brace and the bag. Leave the ice on for 20 minutes, 2?3 times a day. Activity Stay active as directed by your health care provider. Find a safe exercise program that fits your needs and ability. Maintain good posture when walking and sitting. Work with a physical therapist to learn exercises that will stretch and strengthen your muscles. Do stretching and range-of-motion exercises at home as told by a physical therapist. Work with an occupational therapist. This type of health care provider can help you function better at home and at work. If you have severe spasticity, use mobility aids, such as a walker or cane, as told by your health care provider. General instructions Watch your condition for any changes. Wear loose, comfortable clothing that does not restrict your  movement. Wear closed-toe shoes that fit well and support your feet. Wear shoes that have rubber soles or low heels. Take over-the-counter and prescription medicine only as told by your health care provider. Keep all follow-up visits as told by your health care provider. This is important. Contact a health care provider if you: Have worsening muscle spasms. Develop other symptoms along with spasticity. Have a fever or chills. Experience a burning feeling when you pass urine. Become constipated. Need more support at home. Get help right away if you: Have trouble breathing. Have a muscle spasm that freezes you into a painful position. Cannot walk. Cannot care for yourself at home. Have trouble passing urine or have urinary incontinence. Summary Spasticity is a condition in which your muscles contract suddenly and unpredictably (spasm). Spasticity usually affects your arms, legs, or back. Spasticity can range from mild muscle stiffness and tightness to severe, uncontrollable muscle spasms. Do stretching and range-of-motion exercises at home as told by a physical therapist. Take over-the-counter and prescription medicine only as told by your health care provider. This information is not intended to replace advice given to you by your health care provider. Make sure you discuss any questions you have with your health care provider. Document Revised: 10/09/2022 Document Reviewed: 10/09/2022 Elsevier Patient Education  2024 [employer].

## 2024-06-12 NOTE — Telephone Encounter (Signed)
 As long as it continues to help.  Can take it every day or as needed.  Thanks.

## 2024-06-17 ENCOUNTER — Other Ambulatory Visit: Payer: Self-pay | Admitting: Emergency Medicine

## 2024-06-17 DIAGNOSIS — M7918 Myalgia, other site: Secondary | ICD-10-CM

## 2024-06-17 DIAGNOSIS — M62830 Muscle spasm of back: Secondary | ICD-10-CM

## 2024-08-17 NOTE — Patient Instructions (Incomplete)
 Asthma Continue albuterol  2 puffs once every 4 hours as needed for cough or wheeze You may use albuterol  2 puffs 5 to 15 minutes before activity to decrease cough or wheeze For now and for asthma flare, begin fluticasone  44-2 puffs twice a day for 1 to 2 weeks or until cough and wheeze free.   Allergic rhinitis Continue allergen avoidance measures directed toward dust mite and mold as listed below Begin Claritin 10 mg once a day as needed for runny nose or itch Continue Flonase  2 sprays in each nostril once a day as needed for a stuffy nose Consider saline nasal rinses as needed for nasal symptoms. Use this before any medicated nasal sprays for best result  Hives Use the least amount of medications while remaining hive free Claritin 10 mg twice a day and famotidine (Pepcid) 20 mg twice a day. If no symptoms for 7-14 days then decrease to. Claritin 10 mg twice a day and famotidine (Pepcid) 20 mg once a day.  If no symptoms for 7-14 days then decrease to. Claritin 10 mg  twice a day.  If no symptoms for 7-14 days then decrease to. Claritin 10 mg  once a day.    Call the clinic if this treatment plan is not working well for you.  Follow up in 2 months or sooner if needed.  Control of Mold Allergen Mold and fungi can grow on a variety of surfaces provided certain temperature and moisture conditions exist.  Outdoor molds grow on plants, decaying vegetation and soil.  The major outdoor mold, Alternaria and Cladosporium, are found in very high numbers during hot and dry conditions.  Generally, a late Summer - Fall peak is seen for common outdoor fungal spores.  Rain will temporarily lower outdoor mold spore count, but counts rise rapidly when the rainy period ends.  The most important indoor molds are Aspergillus and Penicillium.  Dark, humid and poorly ventilated basements are ideal sites for mold growth.  The next most common sites of mold growth are the bathroom and the kitchen.  Outdoor [employer] Use air conditioning and keep windows closed Avoid exposure to decaying vegetation. Avoid leaf raking. Avoid grain handling. Consider wearing a face mask if working in moldy areas.  Indoor Mold Control Maintain humidity below 50%. Clean washable surfaces with 5% bleach solution. Remove sources e.g. Contaminated carpets.   Control of Dust Mite Allergen Dust mites play a major role in allergic asthma and rhinitis. They occur in environments with high humidity wherever human skin is found. Dust mites absorb humidity from the atmosphere (ie, they do not drink) and feed on organic matter (including shed human and animal skin). Dust mites are a microscopic type of insect that you cannot see with the naked eye. High levels of dust mites have been detected from mattresses, pillows, carpets, upholstered furniture, bed covers, clothes, soft toys and any woven material. The principal allergen of the dust mite is found in its feces. A gram of dust may contain 1,000 mites and 250,000 fecal particles. Mite antigen is easily measured in the air during house cleaning activities. Dust mites do not bite and do not cause harm to humans, other than by triggering allergies/asthma.  Ways to decrease your exposure to dust mites in your home:  1. Encase mattresses, box springs and pillows with a mite-impermeable barrier or cover  2. Wash sheets, blankets and drapes weekly in hot water  (130 F) with detergent and dry them in a dryer on the  hot setting.  3. Have the room cleaned frequently with a vacuum cleaner and a damp dust-mop. For carpeting or rugs, vacuuming with a vacuum cleaner equipped with a high-efficiency particulate air (HEPA) filter. The dust mite allergic individual should not be in a room which is being cleaned and should wait 1 hour after cleaning before going into the room.  4. Do not sleep on upholstered furniture (eg, couches).  5. If possible removing carpeting, upholstered furniture and  drapery from the home is ideal. Horizontal blinds should be eliminated in the rooms where the person spends the most time (bedroom, study, television room). Washable vinyl, roller-type shades are optimal.  6. Remove all non-washable stuffed toys from the bedroom. Wash stuffed toys weekly like sheets and blankets above.  7. Reduce indoor humidity to less than 50%. Inexpensive humidity monitors can be purchased at most hardware stores. Do not use a humidifier as can make the problem worse and are not recommended.

## 2024-08-17 NOTE — Progress Notes (Unsigned)
   [address] [place] [address] Dept: [phone]  FOLLOW UP NOTE  Patient ID: Chelsea Fernandez, female    DOB: [birth year]  Age: 33 y.o. MRN: [id number] Date of Office Visit: 08/18/2024  Assessment  Chief Complaint: No chief complaint on file.  HPI Chelsea Fernandez is a 33 year old female who presents to the clinic for a follow up visit. She was last seen in this clinic on 01/15/2023 by Arlean Mutter, FNP, for evaluation of asthma and urticaria.   Her last environmental allergy testing was on 06/10/2020 and was positive to mold and dust mite. Environmental allergy testing via lab on 01/15/2023 was negative to the panel.  Chronic urticaria lab testing on 06/11/2023 was normal.  Discussed the use of AI scribe software for clinical note transcription with the patient, who gave verbal consent to proceed.  History of Present Illness      Drug Allergies:  Allergies  Allergen Reactions   Other     Almond Butter    Physical Exam: There were no vitals taken for this visit.   Physical Exam  Diagnostics:    Assessment and Plan: No diagnosis found.  No orders of the defined types were placed in this encounter.   There are no Patient Instructions on file for this visit.  No follow-ups on file.    Thank you for the opportunity to care for this patient.  Please do not hesitate to contact me with questions.  Arlean Mutter, FNP [facility]

## 2024-08-18 ENCOUNTER — Other Ambulatory Visit: Payer: Self-pay

## 2024-08-18 ENCOUNTER — Ambulatory Visit (INDEPENDENT_AMBULATORY_CARE_PROVIDER_SITE_OTHER): Payer: Self-pay | Admitting: Family Medicine

## 2024-08-18 ENCOUNTER — Encounter: Payer: Self-pay | Admitting: Family Medicine

## 2024-08-18 VITALS — BP 102/88 | HR 79 | Temp 98.3°F | Ht 59.45 in | Wt 158.1 lb

## 2024-08-18 DIAGNOSIS — L501 Idiopathic urticaria: Secondary | ICD-10-CM

## 2024-08-18 DIAGNOSIS — J4520 Mild intermittent asthma, uncomplicated: Secondary | ICD-10-CM

## 2024-08-18 DIAGNOSIS — J3089 Other allergic rhinitis: Secondary | ICD-10-CM

## 2024-08-18 MED ORDER — PROAIR RESPICLICK 108 (90 BASE) MCG/ACT IN AEPB: 2.0000 | INHALATION_SPRAY | RESPIRATORY_TRACT | 1 refills | Status: AC | PRN

## 2024-08-18 MED ORDER — FLUTICASONE PROPIONATE HFA 44 MCG/ACT IN AERO: INHALATION_SPRAY | RESPIRATORY_TRACT | 1 refills | Status: DC

## 2024-08-21 NOTE — Addendum Note (Signed)
 Addended by: MARCINE ISAIAH CROME on: 08/21/2024 04:12 PM   Modules accepted: Orders

## 2024-10-19 NOTE — Progress Notes (Unsigned)
" ° °  [address] [place] [address] Dept: [phone]  FOLLOW UP NOTE  Patient ID: Chelsea Fernandez, female    DOB: June 04, 1991  Age: 33 y.o. MRN: [id number] Date of Office Visit: 10/20/2024  Assessment  Chief Complaint: No chief complaint on file.  HPI Chelsea Fernandez is a 33 year old female who presents to clinic for follow-up visit.  She was last seen in this clinic on 08/18/2024 by Arlean Mutter, FNP, for evaluation of asthma requiring Flovent , allergic rhinitis, and urticaria. Her last environmental allergy testing was on 06/10/2020 and was positive to mold and dust mite. Environmental allergy testing via lab on 01/15/2023 was negative to the panel.  Discussed the use of AI scribe software for clinical note transcription with the patient, who gave verbal consent to proceed.  History of Present Illness      Drug Allergies:  Allergies[1]  Physical Exam: There were no vitals taken for this visit.   Physical Exam  Diagnostics:    Assessment and Plan: No diagnosis found.  No orders of the defined types were placed in this encounter.   There are no Patient Instructions on file for this visit.  No follow-ups on file.    Thank you for the opportunity to care for this patient.  Please do not hesitate to contact me with questions.  Arlean Mutter, FNP [facility]          [1]  Allergies Allergen Reactions   Other     Almond Butter   "

## 2024-10-19 NOTE — Patient Instructions (Incomplete)
 Asthma Continue albuterol  2 puffs once every 4 hours as needed for cough or wheeze You may use albuterol  2 puffs 5 to 15 minutes before activity to decrease cough or wheeze For for asthma flare, begin fluticasone  44-2 puffs twice a day for 1 to 2 weeks or until cough and wheeze free.   Allergic rhinitis Continue allergen avoidance measures directed toward dust mite and mold as listed below nBegi Claritin 10 mg once a day as needed for runny nose or itch Continue Flonase  2 sprays in each nostril once a day as needed for a stuffy nose Consider saline nasal rinses as needed for nasal symptoms. Use this before any medicated nasal sprays for best result  Hives Use the least amount of medications while remaining hive free Claritin 10 mg twice a day and famotidine (Pepcid) 20 mg twice a day. If no symptoms for 7-14 days then decrease to Claritin 10 mg twice a day and famotidine (Pepcid) 20 mg once a day.  If no symptoms for 7-14 days then decrease to Claritin 10 mg  twice a day.  If no symptoms for 7-14 days then decrease to Claritin 10 mg  once a day.    Call the clinic if this treatment plan is not working well for you.  Follow up in 6 months or sooner if needed.  Control of Mold Allergen Mold and fungi can grow on a variety of surfaces provided certain temperature and moisture conditions exist.  Outdoor molds grow on plants, decaying vegetation and soil.  The major outdoor mold, Alternaria and Cladosporium, are found in very high numbers during hot and dry conditions.  Generally, a late Summer - Fall peak is seen for common outdoor fungal spores.  Rain will temporarily lower outdoor mold spore count, but counts rise rapidly when the rainy period ends.  The most important indoor molds are Aspergillus and Penicillium.  Dark, humid and poorly ventilated basements are ideal sites for mold growth.  The next most common sites of mold growth are the bathroom and the kitchen.  Outdoor [employer] Use air conditioning and keep windows closed Avoid exposure to decaying vegetation. Avoid leaf raking. Avoid grain handling. Consider wearing a face mask if working in moldy areas.  Indoor Mold Control Maintain humidity below 50%. Clean washable surfaces with 5% bleach solution. Remove sources e.g. Contaminated carpets.   Control of Dust Mite Allergen Dust mites play a major role in allergic asthma and rhinitis. They occur in environments with high humidity wherever human skin is found. Dust mites absorb humidity from the atmosphere (ie, they do not drink) and feed on organic matter (including shed human and animal skin). Dust mites are a microscopic type of insect that you cannot see with the naked eye. High levels of dust mites have been detected from mattresses, pillows, carpets, upholstered furniture, bed covers, clothes, soft toys and any woven material. The principal allergen of the dust mite is found in its feces. A gram of dust may contain 1,000 mites and 250,000 fecal particles. Mite antigen is easily measured in the air during house cleaning activities. Dust mites do not bite and do not cause harm to humans, other than by triggering allergies/asthma.  Ways to decrease your exposure to dust mites in your home:  1. Encase mattresses, box springs and pillows with a mite-impermeable barrier or cover  2. Wash sheets, blankets and drapes weekly in hot water  (130 F) with detergent and dry them in a dryer on the hot setting.  3. Have the room cleaned frequently with a vacuum cleaner and a damp dust-mop. For carpeting or rugs, vacuuming with a vacuum cleaner equipped with a high-efficiency particulate air (HEPA) filter. The dust mite allergic individual should not be in a room which is being cleaned and should wait 1 hour after cleaning before going into the room.  4. Do not sleep on upholstered furniture (eg, couches).  5. If possible removing carpeting, upholstered furniture and  drapery from the home is ideal. Horizontal blinds should be eliminated in the rooms where the person spends the most time (bedroom, study, television room). Washable vinyl, roller-type shades are optimal.  6. Remove all non-washable stuffed toys from the bedroom. Wash stuffed toys weekly like sheets and blankets above.  7. Reduce indoor humidity to less than 50%. Inexpensive humidity monitors can be purchased at most hardware stores. Do not use a humidifier as can make the problem worse and are not recommended.

## 2024-10-20 ENCOUNTER — Encounter: Payer: Self-pay | Admitting: Family Medicine

## 2024-10-20 ENCOUNTER — Ambulatory Visit (INDEPENDENT_AMBULATORY_CARE_PROVIDER_SITE_OTHER): Payer: Self-pay | Admitting: Family Medicine

## 2024-10-20 ENCOUNTER — Other Ambulatory Visit: Payer: Self-pay

## 2024-10-20 VITALS — BP 114/76 | HR 89 | Temp 98.4°F

## 2024-10-20 DIAGNOSIS — L501 Idiopathic urticaria: Secondary | ICD-10-CM

## 2024-10-20 DIAGNOSIS — J3089 Other allergic rhinitis: Secondary | ICD-10-CM

## 2024-10-20 DIAGNOSIS — J45909 Unspecified asthma, uncomplicated: Secondary | ICD-10-CM | POA: Insufficient documentation

## 2024-10-20 DIAGNOSIS — J45998 Other asthma: Secondary | ICD-10-CM

## 2024-10-20 MED ORDER — BUDESONIDE-FORMOTEROL FUMARATE 80-4.5 MCG/ACT IN AERO: 2.0000 | INHALATION_SPRAY | Freq: Two times a day (BID) | RESPIRATORY_TRACT | 5 refills | Status: AC

## 2024-12-25 ENCOUNTER — Ambulatory Visit: Payer: Self-pay | Admitting: Family Medicine
